# Patient Record
Sex: Male | Born: 1985 | Race: White | Hispanic: No | Marital: Married | Smoking: Former smoker
Health system: Southern US, Community
[De-identification: ages and names within clinical notes are randomized; demographics above are authoritative.]

## PROBLEM LIST (undated history)

## (undated) DIAGNOSIS — F32A Depression, unspecified: Secondary | ICD-10-CM

## (undated) DIAGNOSIS — F419 Anxiety disorder, unspecified: Secondary | ICD-10-CM

## (undated) DIAGNOSIS — G4733 Obstructive sleep apnea (adult) (pediatric): Secondary | ICD-10-CM

## (undated) DIAGNOSIS — I10 Essential (primary) hypertension: Secondary | ICD-10-CM

## (undated) DIAGNOSIS — K449 Diaphragmatic hernia without obstruction or gangrene: Secondary | ICD-10-CM

## (undated) DIAGNOSIS — F429 Obsessive-compulsive disorder, unspecified: Secondary | ICD-10-CM

## (undated) DIAGNOSIS — R454 Irritability and anger: Secondary | ICD-10-CM

## (undated) DIAGNOSIS — F329 Major depressive disorder, single episode, unspecified: Secondary | ICD-10-CM

## (undated) DIAGNOSIS — R4586 Emotional lability: Secondary | ICD-10-CM

## (undated) DIAGNOSIS — I517 Cardiomegaly: Secondary | ICD-10-CM

## (undated) HISTORY — DX: Essential (primary) hypertension: I10

## (undated) HISTORY — DX: Diaphragmatic hernia without obstruction or gangrene: K44.9

---

## 2008-08-28 ENCOUNTER — Encounter: Admission: RE | Admit: 2008-08-28 | Discharge: 2008-08-28 | Payer: Self-pay | Admitting: Family Medicine

## 2010-11-08 ENCOUNTER — Encounter: Payer: Self-pay | Admitting: Family Medicine

## 2014-01-27 ENCOUNTER — Emergency Department (HOSPITAL_COMMUNITY)
Admission: EM | Admit: 2014-01-27 | Discharge: 2014-01-28 | Disposition: A | Discharge status: Home / Self Care | Payer: PRIVATE HEALTH INSURANCE | Attending: Emergency Medicine | Admitting: Emergency Medicine

## 2014-01-27 ENCOUNTER — Emergency Department (HOSPITAL_COMMUNITY): Payer: PRIVATE HEALTH INSURANCE

## 2014-01-27 ENCOUNTER — Encounter (HOSPITAL_COMMUNITY): Payer: Self-pay | Admitting: Emergency Medicine

## 2014-01-27 DIAGNOSIS — Z79899 Other long term (current) drug therapy: Secondary | ICD-10-CM | POA: Insufficient documentation

## 2014-01-27 DIAGNOSIS — R45851 Suicidal ideations: Secondary | ICD-10-CM | POA: Insufficient documentation

## 2014-01-27 DIAGNOSIS — R0789 Other chest pain: Secondary | ICD-10-CM | POA: Insufficient documentation

## 2014-01-27 DIAGNOSIS — F329 Major depressive disorder, single episode, unspecified: Secondary | ICD-10-CM | POA: Insufficient documentation

## 2014-01-27 DIAGNOSIS — F411 Generalized anxiety disorder: Secondary | ICD-10-CM | POA: Insufficient documentation

## 2014-01-27 DIAGNOSIS — F3289 Other specified depressive episodes: Secondary | ICD-10-CM | POA: Insufficient documentation

## 2014-01-27 DIAGNOSIS — F1010 Alcohol abuse, uncomplicated: Secondary | ICD-10-CM | POA: Insufficient documentation

## 2014-01-27 DIAGNOSIS — F1210 Cannabis abuse, uncomplicated: Secondary | ICD-10-CM | POA: Insufficient documentation

## 2014-01-27 DIAGNOSIS — R630 Anorexia: Secondary | ICD-10-CM | POA: Insufficient documentation

## 2014-01-27 DIAGNOSIS — F430 Acute stress reaction: Secondary | ICD-10-CM | POA: Insufficient documentation

## 2014-01-27 DIAGNOSIS — F17200 Nicotine dependence, unspecified, uncomplicated: Secondary | ICD-10-CM | POA: Insufficient documentation

## 2014-01-27 HISTORY — DX: Major depressive disorder, single episode, unspecified: F32.9

## 2014-01-27 HISTORY — DX: Emotional lability: R45.86

## 2014-01-27 HISTORY — DX: Anxiety disorder, unspecified: F41.9

## 2014-01-27 HISTORY — DX: Obsessive-compulsive disorder, unspecified: F42.9

## 2014-01-27 HISTORY — DX: Irritability and anger: R45.4

## 2014-01-27 HISTORY — DX: Depression, unspecified: F32.A

## 2014-01-27 LAB — RAPID URINE DRUG SCREEN, HOSP PERFORMED
Amphetamines: NOT DETECTED
Barbiturates: NOT DETECTED
Benzodiazepines: NOT DETECTED
COCAINE: NOT DETECTED
Opiates: NOT DETECTED
Tetrahydrocannabinol: NOT DETECTED

## 2014-01-27 LAB — CBC WITH DIFFERENTIAL/PLATELET
BASOS ABS: 0.1 10*3/uL (ref 0.0–0.1)
Basophils Relative: 0 % (ref 0–1)
Eosinophils Absolute: 0.3 10*3/uL (ref 0.0–0.7)
Eosinophils Relative: 2 % (ref 0–5)
HEMATOCRIT: 46.4 % (ref 39.0–52.0)
HEMOGLOBIN: 16.8 g/dL (ref 13.0–17.0)
LYMPHS PCT: 18 % (ref 12–46)
Lymphs Abs: 2.6 10*3/uL (ref 0.7–4.0)
MCH: 30.5 pg (ref 26.0–34.0)
MCHC: 36.2 g/dL — ABNORMAL HIGH (ref 30.0–36.0)
MCV: 84.4 fL (ref 78.0–100.0)
MONOS PCT: 8 % (ref 3–12)
Monocytes Absolute: 1.1 10*3/uL — ABNORMAL HIGH (ref 0.1–1.0)
NEUTROS ABS: 10.5 10*3/uL — AB (ref 1.7–7.7)
Neutrophils Relative %: 72 % (ref 43–77)
Platelets: 250 10*3/uL (ref 150–400)
RBC: 5.5 MIL/uL (ref 4.22–5.81)
RDW: 12.5 % (ref 11.5–15.5)
WBC: 14.5 10*3/uL — AB (ref 4.0–10.5)

## 2014-01-27 LAB — ETHANOL

## 2014-01-27 LAB — COMPREHENSIVE METABOLIC PANEL
ALT: 15 U/L (ref 0–53)
AST: 20 U/L (ref 0–37)
Albumin: 4.2 g/dL (ref 3.5–5.2)
Alkaline Phosphatase: 88 U/L (ref 39–117)
BUN: 11 mg/dL (ref 6–23)
CO2: 24 mEq/L (ref 19–32)
CREATININE: 1.02 mg/dL (ref 0.50–1.35)
Calcium: 9.7 mg/dL (ref 8.4–10.5)
Chloride: 100 mEq/L (ref 96–112)
GFR calc Af Amer: >90 mL/min (ref >90)
GFR calc non Af Amer: >90 mL/min (ref >90)
Glucose, Bld: 125 mg/dL — ABNORMAL HIGH (ref 70–99)
Potassium: 3.8 mEq/L (ref 3.7–5.3)
Sodium: 142 mEq/L (ref 137–147)
Total Bilirubin: 0.2 mg/dL — ABNORMAL LOW (ref 0.3–1.2)
Total Protein: 7.4 g/dL (ref 6.0–8.3)

## 2014-01-27 LAB — D-DIMER, QUANTITATIVE (NOT AT ARMC): D-Dimer, Quant: <0.27 ug/mL-FEU (ref 0.00–0.48)

## 2014-01-27 LAB — TROPONIN I: Troponin I: <0.3 ng/mL (ref <0.30)

## 2014-01-27 MED ORDER — NICOTINE 21 MG/24HR TD PT24
21.0000 mg | MEDICATED_PATCH | Freq: Once | TRANSDERMAL | Status: DC
  Administered 2014-01-27: 21 mg via TRANSDERMAL
  Filled 2014-01-27: qty 1

## 2014-01-27 MED ORDER — ZOLPIDEM TARTRATE 5 MG PO TABS
5.0000 mg | ORAL_TABLET | Freq: Every evening | ORAL | Status: DC | PRN
  Administered 2014-01-27: 5 mg via ORAL
  Filled 2014-01-27: qty 1

## 2014-01-27 MED ORDER — CITALOPRAM HYDROBROMIDE 10 MG PO TABS
20.0000 mg | ORAL_TABLET | Freq: Every day | ORAL | Status: DC
  Administered 2014-01-27 – 2014-01-28 (×2): 20 mg via ORAL
  Filled 2014-01-27 (×2): qty 2

## 2014-01-27 MED ORDER — CLONIDINE HCL 0.2 MG PO TABS
0.2000 mg | ORAL_TABLET | Freq: Every day | ORAL | Status: DC
  Administered 2014-01-27 – 2014-01-28 (×2): 0.2 mg via ORAL
  Filled 2014-01-27 (×2): qty 1

## 2014-01-27 NOTE — BH Assessment (Signed)
Tele Assessment Note   Clinton Taylor is an 28 y.o. male, married, Caucasian who presents to [facility] ED accompanied by his wife, who participated in the assessment with the Pt's permission. Pt reports he is experiencing severe stress and is suicidal. Pt describes having having ongoing issues since childhood with depression, obsessive thoughts, sexual preoccupation, shame, substance abuse and lying. 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 Pt reports crying spells, poor sleep, increased alcohol use, irritability, obsessive sexual thoughts and behavior, bad dreams and feelings of sadness, guilt, shame and hopelessness. Pt report suicidal ideation with plan to "shoot myself under my chin" or use an intravenous line in an artery to bleed to death. Pt reports a history of one previous suicide attempt by cutting his wrist when he was an adolescent in response to his grandfather's death. Pt also reports a friend unexpectedly committed suicide by hanging himself two months ago. Pt denies homicidal ideation and says although he has problems with anger he has only been in two physical fights as an adolescent. He denies any psychotic symptoms.   Pt states as an adolescent he used crystal meth, cocaine and crack but he has not use any substances in years. He report binge drinking on liquor and these binges have increased in frequency. Pt reports he is currently drinking approximately 5-7 shots of liquor approximately five days per week. He reports a history of blackouts. He denies any history of alcohol withdrawal or seizures.  Pt identifies his primary problem as sexual issues that have created severe problems for his marriage. 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 He states he is attracted to women but has  also has had two sexual encounters with men that he didn't find satisfying. His wife says he is sexually aroused all the time, to the point where she wonders if there is something neurologically wrong with him. Pt acknowledges there have been several "infidelities."  Pt and wife also report Pt has a problem with pathological lying and that Pt will lie "about everything, even stupid little things like whether he spent $10 or $20." Pt's wife also states Pt is "angry all the time" and Pt reports he has mood swings. Pt states he has deep feelings of shame and believes there is something wrong with him. Pt states he has obsessive negative thoughts and uses sex and alcohol to distract himself.  Pt and his wife have been discussing Pt's problems recently and wife is ready for divorce.  Pt reports brief outpatient therapy as an adolescent. He was prescribed Zoloft by his PCP but did not like the way it made him feel and he stopped taking it two months ago. He has had no other outpatient mental health or substance abuse treatment. Pt has had no inpatient mental health or substance abuse treatment.  Pt denies any history of abuse but says he engaged in some sexual play with another child at age 34. He reports his mother has been diagnosed with obsessive-compulsive disorder. He reports there is a history of substance abuse on both sides of his family. He identifies his wife as his primary support and together they have three children, ages 76, 5 and 3. Pt is employed as a Teaching laboratory technician.  Pt is well-groomed, alert,  oriented x4 with normal speech and normal motor behavior. Eye contact is good with tearfulness. Pt's thought process is coherent and goal directed. There is no evidence Pt is responding to internal stimuli or experiencing delusional thought content. Pt's mood is depressed and ashamed and affect is congruent with mood. Pt was cooperative and candid regarding his various concerns. Pt is agreeable to  inpatient psychiatric treatment.  Axis I: 296.33 Major Depressive Disorder, Recurrent, Severe;  303.90 Alcohol Use Disorder, Moderate; Rule Out Bipolar Disorder; Rule Out Obsessive-Compulsive Disorder Axis II: Deferred Axis III:  Past Medical History  Diagnosis Date  . Depression   . Obsessive compulsive disorder   . Anxiety   . Mood changes   . Anger    Axis IV: other psychosocial or environmental problems and problems with primary support group Axis V: GAF=28  Past Medical History:  Past Medical History  Diagnosis Date  . Depression   . Obsessive compulsive disorder   . Anxiety   . Mood changes   . Anger     History reviewed. No pertinent past surgical history.  Family History: No family history on file.  Social History:  reports that he has been smoking Cigarettes.  He has been smoking about 0.50 packs per day. His smokeless tobacco use includes Chew. He reports that he drinks alcohol. He reports that he uses illicit drugs (Marijuana).  Additional Social History:  Alcohol / Drug Use Pain Medications: Denies abuse Prescriptions: Denies abuse Over the Counter: Pt reports drinking an entire bottle of Tylenol tying to sleep History of alcohol / drug use?: Yes (Pt reports trying crystal meth, cocaine and crack as an adolescent) Longest period of sobriety (when/how long): unknown Negative Consequences of Use: Personal relationships Substance #1 Name of Substance 1: Alcohol  1 - Age of First Use: teen 1 - Amount (size/oz): 5-7 shots of liquor 1 - Frequency: Average 5 days per week 1 - Duration: Ongoing 1 - Last Use / Amount: 01/27/14  CIWA: CIWA-Ar BP: 168/101 mmHg Pulse Rate: 99 COWS:    Allergies: No Known Allergies  Home Medications:  (Not in a hospital admission)  OB/GYN Status:  No LMP for male patient.  General Assessment Data Location of Assessment: [facility] ED Is this a Tele or Face-to-Face Assessment?: Tele Assessment Is this an Initial Assessment or a  Re-assessment for this encounter?: Initial Assessment Living Arrangements: Spouse/significant other;Children (Children, ages 44, 5, 3) Can pt return to current living arrangement?: Yes Admission Status: Voluntary Is patient capable of signing voluntary admission?: Yes Transfer from: Acute Hospital Referral Source: Self/Family/Friend     [facility] Crisis Care Plan Living Arrangements: Spouse/significant other;Children (Children, ages 21, 22, 3) Name of Psychiatrist: None Name of Therapist: None  Education Status Is patient currently in school?: No Current Grade: NA Highest grade of school patient has completed: NA Name of school: NA Contact person: NA  Risk to self Suicidal Ideation: Yes-Currently Present Suicidal Intent: Yes-Currently Present Is patient at risk for suicide?: Yes Suicidal Plan?: Yes-Currently Present Specify Current Suicidal Plan: Shoot himself, use IV line to bleed out Access to Means: Yes Specify Access to Suicidal Means: Gun in the home What has been your use of drugs/alcohol within the last 12 months?: Pt drinking heavily Previous Attempts/Gestures: Yes How many times?: 1 Other Self Harm Risks: None Triggers for Past Attempts: Other (Comment) (Death of grandfather) Intentional Self Injurious Behavior: None Family Suicide History: No;See progress notes Recent stressful life event(s): Conflict (Comment);Divorce;Loss (Comment) (Associate committed suicide, severe marital  problems) Persecutory voices/beliefs?: No Depression: Yes Depression Symptoms: Despondent;Insomnia;Tearfulness;Isolating;Guilt;Loss of interest in usual pleasures;Feeling worthless/self pity;Feeling angry/irritable;Fatigue Substance abuse history and/or treatment for substance abuse?: Yes Suicide prevention information given to non-admitted patients: Not applicable  Risk to Others Homicidal Ideation: No Thoughts of Harm to Others: No Current Homicidal Intent: No Current Homicidal Plan:  No Access to Homicidal Means: No Identified Victim: None History of harm to others?: No Assessment of Violence: None Noted Violent Behavior Description: None Does patient have access to weapons?: Yes (Comment) (Gun in the home) Criminal Charges Pending?: No Does patient have a court date: No  Psychosis Hallucinations: None noted Delusions: None noted  Mental Status Report Appear/Hygiene: Other (Comment) (Well groomed) Eye Contact: Good Motor Activity: Unremarkable Speech: Logical/coherent Level of Consciousness: Alert;Crying Mood: Depressed;Ashamed/humiliated;Guilty Affect: Depressed Anxiety Level: Moderate Thought Processes: Coherent;Relevant Judgement: Unimpaired Orientation: Person;Place;Time;Situation Obsessive Compulsive Thoughts/Behaviors: Moderate  Cognitive Functioning Concentration: Normal Memory: Recent Intact;Remote Intact IQ: Average Insight: Fair Impulse Control: Fair Appetite: Fair Weight Loss: 0 Weight Gain: 0 Sleep: Decreased Total Hours of Sleep: 5 Vegetative Symptoms: None  ADLScreening [facility] Assessment Services) Patient's cognitive ability adequate to safely complete daily activities?: Yes Patient able to express need for assistance with ADLs?: Yes Independently performs ADLs?: Yes (appropriate for developmental age)  Prior Inpatient Therapy Prior Inpatient Therapy: No Prior Therapy Dates: NA Prior Therapy Facilty/Provider(s): NA Reason for Treatment: NA  Prior Outpatient Therapy Prior Outpatient Therapy: Yes Prior Therapy Dates: As adolsecent Prior Therapy Facilty/Provider(s): Unknown Reason for Treatment: Depression  ADL Screening (condition at time of admission) Patient's cognitive ability adequate to safely complete daily activities?: Yes Is the patient deaf or have difficulty hearing?: No Does the patient have difficulty seeing, even when wearing glasses/contacts?: No Does the patient have difficulty concentrating, remembering, or  making decisions?: No Patient able to express need for assistance with ADLs?: Yes Does the patient have difficulty dressing or bathing?: No Independently performs ADLs?: Yes (appropriate for developmental age) Does the patient have difficulty walking or climbing stairs?: No Weakness of Legs: None Weakness of Arms/Hands: None  Home Assistive Devices/Equipment Home Assistive Devices/Equipment: None    Abuse/Neglect Assessment (Assessment to be complete while patient is alone) Physical Abuse: Denies Verbal Abuse: Denies Sexual Abuse: Denies Exploitation of patient/patient's resources: Denies Self-Neglect: Denies Values / Beliefs Cultural Requests During Hospitalization: None Spiritual Requests During Hospitalization: None   Advance Directives (For Healthcare) Advance Directive: Patient does not have advance directive;Patient would not like information Pre-existing out of facility DNR order (yellow form or pink MOST form): No Nutrition Screen- [facility] Adult/WL/AP Patient's home diet: Regular  Additional Information 1:1 In Past 12 Months?: No CIRT Risk: No Elopement Risk: No Does patient have medical clearance?: Yes     Disposition: Per Thurman CoyerEric Kaplan, [facility], adult unit is at capacity. Gave clinical report to Alberteen SamFran Hobson, NP who agrees Pt meets criteria for inpatient psychiatric treatment. TTS will contact other facilities for placement, otherwise Pt [facility].   Disposition Initial Assessment Completed for this Encounter: Yes Disposition of Patient: Inpatient treatment program;Other dispositions Type of inpatient treatment program: Adult Other disposition(s): Referred to outside facility;Other (Comment) ([facility] at capacity)  Clinton Taylor, [facility], [facility] Triage Specialist [phone]   Clinton RainFord Ellis Patsy BaltimoreWarrick Taylor. 01/27/2014 9:27 PM

## 2014-01-27 NOTE — BH Assessment (Signed)
Assessment complete. Per Thurman CoyerEric Kaplan, [facility], adult unit is at [facility]. Gave clinical report to Alberteen SamFran Hobson, NP who agrees Pt meets criteria for inpatient psychiatric treatment. TTS will contact other facilities for placement, otherwise [facility].  Harlin RainFord Ellis Ria CommentWarrick Jr, LPC, [facility] Triage Specialist [phone]

## 2014-01-27 NOTE — ED Notes (Addendum)
Pt states he feels suicidal, has a plan to shoot himself with gun. States he is experiencing severe anxiety with family issues. States he wants to get some help. Denies HI at the time. Pt also states chest tightness, pt is tearful during assessment.

## 2014-01-27 NOTE — BH Assessment (Signed)
Spoke to Pt's RN to arrange tele-assessment. Pt is in x-ray. RN will call TTS at [phone] when Pt is ready for tele-assessment.  Harlin RainFord Ellis Ria CommentWarrick Jr, LPC, [facility] Triage Specialist [phone]

## 2014-01-27 NOTE — ED Provider Notes (Signed)
CSN: [id number]     Arrival date & time 01/27/14  1650 History   First MD Initiated Contact with Patient 01/27/14 1713     Chief Complaint  Patient presents with  . Suicidal     (Consider location/radiation/quality/duration/timing/severity/associated sxs/prior Treatment) HPI Comments: Patient presents with suicidal thoughts for the past several days he had a plan to shoot himself yesterday. He is going through divorce he has a lot of stress with his wife and is facing sexuality issues. He admits to binge drinking 6-7 beers once in a while but not every day. Uses marijuana occasionally and smokes cigarettes. He denies any homicidal thoughts or hallucinations. He takes Celexa for depression. Usually takes clonidine for elevated blood pressure but has been out of it for the past one month. He said intermittent episodes of chest pain for the past month lasting a few minutes at a time the center of his chest that come and go. Denies any shortness of breath, nausea or vomiting.  The history is provided by the patient.    Past Medical History  Diagnosis Date  . Depression   . Obsessive compulsive disorder   . Anxiety   . Mood changes   . Anger    History reviewed. No pertinent past surgical history. No family history on file. History  Substance Use Topics  . Smoking status: Current Every Day Smoker -- 0.50 packs/day    Types: Cigarettes  . Smokeless tobacco: Current User    Types: Chew  . Alcohol Use: Yes     Comment: socially    Review of Systems  Constitutional: Positive for activity change and appetite change. Negative for fever.  HENT: Negative for congestion and rhinorrhea.   Respiratory: Positive for chest tightness. Negative for cough and shortness of breath.   Cardiovascular: Negative for chest pain.  Gastrointestinal: Negative for nausea, vomiting and abdominal pain.  Genitourinary: Negative for dysuria and hematuria.  Musculoskeletal: Negative for arthralgias and  myalgias.  Skin: Negative for rash.  Neurological: Negative for dizziness, weakness and headaches.  Psychiatric/Behavioral: Positive for suicidal ideas and self-injury. The patient is nervous/anxious.   A complete 10 system review of systems was obtained and all systems are negative except as noted in the HPI and PMH.      Allergies  Review of patient's allergies indicates no known allergies.  Home Medications   Current Outpatient Rx  Name  Route  Sig  Dispense  Refill  . citalopram (CELEXA) 20 MG tablet   Oral   Take 20 mg by mouth daily.         . cloNIDine (CATAPRES) 0.2 MG tablet   Oral   Take 0.2 mg by mouth daily.         Marland Kitchen ibuprofen (ADVIL,MOTRIN) 200 MG tablet   Oral   Take 200 mg by mouth every 6 (six) hours as needed.          BP 162/94  Pulse 74  Temp(Src) 97.8 F (36.6 C) (Oral)  Resp 16  Ht 6' (1.829 m)  Wt 263 lb (119.296 kg)  BMI 35.66 kg/m2  SpO2 96% Physical Exam  Constitutional: He is oriented to person, place, and time. He appears well-developed and well-nourished. No distress.  Flat affect.   HENT:  Head: Normocephalic and atraumatic.  Mouth/Throat: Oropharynx is clear and moist. No oropharyngeal exudate.  Eyes: Conjunctivae and EOM are normal. Pupils are equal, round, and reactive to light.  Dilated pupils  Neck: Normal range of motion. Neck supple.  Cardiovascular: Normal rate, regular rhythm and normal heart sounds.   No murmur heard. Pulmonary/Chest: Effort normal and breath sounds normal. No respiratory distress.  Abdominal: Soft. Bowel sounds are normal. There is no tenderness. There is no rebound and no guarding.  Musculoskeletal: Normal range of motion. He exhibits no edema and no tenderness.  Neurological: He is alert and oriented to person, place, and time. No cranial nerve deficit. He exhibits normal muscle tone. Coordination normal.  Skin: Skin is warm.    ED Course  Procedures (including critical care time) Labs  Review Labs Reviewed  CBC WITH DIFFERENTIAL - Abnormal; Notable for the following:    WBC 14.5 (*)    MCHC 36.2 (*)    Neutro Abs 10.5 (*)    Monocytes Absolute 1.1 (*)    All other components within normal limits  COMPREHENSIVE METABOLIC PANEL - Abnormal; Notable for the following:    Glucose, Bld 125 (*)    Total Bilirubin 0.2 (*)    All other components within normal limits  URINE RAPID DRUG SCREEN (HOSP PERFORMED)  ETHANOL  TROPONIN I  D-DIMER, QUANTITATIVE  TROPONIN I   Imaging Review Dg Chest 2 View  01/27/2014   CLINICAL DATA:  Substernal chest pain for 1 day  EXAM: CHEST  2 VIEW  COMPARISON:  None.  FINDINGS: The heart size and mediastinal contours are within normal limits. Both lungs are clear. The visualized skeletal structures are unremarkable.  IMPRESSION: No active cardiopulmonary disease.   Electronically Signed   By: Christiana PellantGretchen  Green M.D.   On: 01/27/2014 19:58     EKG Interpretation   Date/Time:  Sunday January 27 2014 17:43:30 EDT Ventricular Rate:  97 PR Interval:  163 QRS Duration: 87 QT Interval:  325 QTC Calculation: 413 R Axis:   -48 Text Interpretation:  Sinus rhythm Left atrial enlargement Left  ventricular hypertrophy No previous ECGs available Confirmed by Manus GunningANCOUR   MD, Garlin Batdorf [phone]([id number]) on 01/27/2014 5:53:02 PM      MDM   Final diagnoses:  Suicidal ideation   Suicidal thoughts with plan to shoot himself. Intermittent chest pain over the past month with noncompliance with hypertensive medications. Patient hypertensive. He has not taken his blood pressure medication for the past month or longer  Screening labs unremarkable. Troponin negative. No chest pain or shortness of breath currently. EKG with LVH. No acute ST changes.  Labs remarkable for leukocytosis. Troponin negative. D-dimer negative Chest pain is atypical for ACS and likely secondary to anxiety. Patient is restarted on his home blood pressure medications. Holding orders placed.  Stable for psychiatric evaluation  Glynn OctaveStephen Lenoir Facchini, MD 01/27/14 2348

## 2014-01-27 NOTE — ED Notes (Signed)
Telepsych in process 

## 2014-01-27 NOTE — BH Assessment (Signed)
Received call for assessment. Spoke to Langley GaussBryan Sem, MD who said Pt is suicidal with plan to shoot himself. He is going through divorce. Tele-assessment will be initiated.  Harlin RainFord Ellis Ria CommentWarrick Jr, LPC, [facility] Triage Specialist [phone]

## 2014-01-27 NOTE — ED Notes (Signed)
Pt states that he is here due to "a lot going on in my life", "psychological problems", "facing divorce" and "suicidal thoughts".  Pt reports SI, states that he was going to shoot himself yesterday.  Pt has guns at home.  Pt is crying in triage, wife is with him, tearful also.  Pt denies any HI, pt is calm and cooperative.

## 2014-01-28 ENCOUNTER — Inpatient Hospital Stay (HOSPITAL_COMMUNITY)
Admission: AD | Admit: 2014-01-28 | Discharge: 2014-02-01 | DRG: 897 | Disposition: A | Discharge status: Home / Self Care | Payer: PRIVATE HEALTH INSURANCE | Source: Intra-hospital | Attending: Psychiatry | Admitting: Psychiatry

## 2014-01-28 ENCOUNTER — Encounter (HOSPITAL_COMMUNITY): Payer: Self-pay

## 2014-01-28 DIAGNOSIS — F411 Generalized anxiety disorder: Secondary | ICD-10-CM | POA: Diagnosis present

## 2014-01-28 DIAGNOSIS — F10939 Alcohol use, unspecified with withdrawal, unspecified: Principal | ICD-10-CM | POA: Diagnosis present

## 2014-01-28 DIAGNOSIS — F410 Panic disorder [episodic paroxysmal anxiety] without agoraphobia: Secondary | ICD-10-CM | POA: Diagnosis present

## 2014-01-28 DIAGNOSIS — F319 Bipolar disorder, unspecified: Secondary | ICD-10-CM | POA: Diagnosis present

## 2014-01-28 DIAGNOSIS — G4700 Insomnia, unspecified: Secondary | ICD-10-CM | POA: Diagnosis present

## 2014-01-28 DIAGNOSIS — Z23 Encounter for immunization: Secondary | ICD-10-CM

## 2014-01-28 DIAGNOSIS — F339 Major depressive disorder, recurrent, unspecified: Secondary | ICD-10-CM | POA: Diagnosis present

## 2014-01-28 DIAGNOSIS — F17200 Nicotine dependence, unspecified, uncomplicated: Secondary | ICD-10-CM | POA: Diagnosis present

## 2014-01-28 DIAGNOSIS — F1020 Alcohol dependence, uncomplicated: Secondary | ICD-10-CM | POA: Diagnosis present

## 2014-01-28 DIAGNOSIS — I10 Essential (primary) hypertension: Secondary | ICD-10-CM | POA: Diagnosis present

## 2014-01-28 DIAGNOSIS — F429 Obsessive-compulsive disorder, unspecified: Secondary | ICD-10-CM | POA: Diagnosis present

## 2014-01-28 DIAGNOSIS — F1994 Other psychoactive substance use, unspecified with psychoactive substance-induced mood disorder: Secondary | ICD-10-CM | POA: Diagnosis present

## 2014-01-28 DIAGNOSIS — F10239 Alcohol dependence with withdrawal, unspecified: Principal | ICD-10-CM | POA: Diagnosis present

## 2014-01-28 DIAGNOSIS — F332 Major depressive disorder, recurrent severe without psychotic features: Secondary | ICD-10-CM | POA: Diagnosis present

## 2014-01-28 DIAGNOSIS — F3160 Bipolar disorder, current episode mixed, unspecified: Secondary | ICD-10-CM | POA: Diagnosis present

## 2014-01-28 DIAGNOSIS — R45851 Suicidal ideations: Secondary | ICD-10-CM

## 2014-01-28 MED ORDER — NICOTINE 21 MG/24HR TD PT24
MEDICATED_PATCH | TRANSDERMAL | Status: AC
Start: 2014-01-28 — End: 2014-01-29
  Administered 2014-01-28: 21 mg via TRANSDERMAL
  Filled 2014-01-28: qty 1

## 2014-01-28 MED ORDER — LORAZEPAM 1 MG PO TABS
1.0000 mg | ORAL_TABLET | Freq: Four times a day (QID) | ORAL | Status: DC | PRN
  Administered 2014-01-28: 1 mg via ORAL
  Filled 2014-01-28: qty 1

## 2014-01-28 MED ORDER — ALUM & MAG HYDROXIDE-SIMETH 200-200-20 MG/5ML PO SUSP
30.0000 mL | ORAL | Status: DC | PRN
  Administered 2014-02-01: 30 mL via ORAL

## 2014-01-28 MED ORDER — MAGNESIUM HYDROXIDE 400 MG/5ML PO SUSP: 30.0000 mL | Freq: Every day | ORAL | Status: DC | PRN

## 2014-01-28 MED ORDER — CLONIDINE HCL 0.2 MG PO TABS
0.2000 mg | ORAL_TABLET | Freq: Every day | ORAL | Status: DC
  Administered 2014-01-28 – 2014-01-30 (×3): 0.2 mg via ORAL
  Filled 2014-01-28 (×2): qty 1
  Filled 2014-01-28: qty 2
  Filled 2014-01-28: qty 1
  Filled 2014-01-28 (×2): qty 2
  Filled 2014-01-28: qty 1
  Filled 2014-01-28: qty 2
  Filled 2014-01-28: qty 1

## 2014-01-28 MED ORDER — AMLODIPINE BESYLATE 10 MG PO TABS
10.0000 mg | ORAL_TABLET | Freq: Once | ORAL | Status: AC
  Administered 2014-01-28: 10 mg via ORAL
  Filled 2014-01-28: qty 1

## 2014-01-28 MED ORDER — CITALOPRAM HYDROBROMIDE 20 MG PO TABS
20.0000 mg | ORAL_TABLET | Freq: Every day | ORAL | Status: DC
  Administered 2014-01-28 – 2014-02-01 (×5): 20 mg via ORAL
  Filled 2014-01-28: qty 3
  Filled 2014-01-28 (×10): qty 1

## 2014-01-28 MED ORDER — CHLORDIAZEPOXIDE HCL 25 MG PO CAPS
25.0000 mg | ORAL_CAPSULE | Freq: Three times a day (TID) | ORAL | Status: DC | PRN
  Administered 2014-01-28 – 2014-01-31 (×4): 25 mg via ORAL
  Filled 2014-01-28 (×5): qty 1

## 2014-01-28 MED ORDER — ACETAMINOPHEN 325 MG PO TABS
650.0000 mg | ORAL_TABLET | Freq: Four times a day (QID) | ORAL | Status: DC | PRN
  Administered 2014-01-28: 650 mg via ORAL
  Filled 2014-01-28: qty 2

## 2014-01-28 MED ORDER — IBUPROFEN 600 MG PO TABS: 600.0000 mg | ORAL_TABLET | Freq: Four times a day (QID) | ORAL | Status: DC | PRN

## 2014-01-28 MED ORDER — ACETAMINOPHEN 325 MG PO TABS
650.0000 mg | ORAL_TABLET | Freq: Four times a day (QID) | ORAL | Status: DC | PRN
  Administered 2014-01-28 – 2014-01-30 (×2): 650 mg via ORAL
  Filled 2014-01-28 (×2): qty 2

## 2014-01-28 MED ORDER — HYDROXYZINE HCL 25 MG PO TABS
25.0000 mg | ORAL_TABLET | Freq: Four times a day (QID) | ORAL | Status: DC | PRN
  Administered 2014-01-29 – 2014-02-01 (×5): 25 mg via ORAL
  Filled 2014-01-28 (×5): qty 1

## 2014-01-28 MED ORDER — CLONIDINE HCL 0.1 MG PO TABS
0.1000 mg | ORAL_TABLET | Freq: Once | ORAL | Status: AC
Start: 2014-01-28 — End: 2014-01-28
  Administered 2014-01-28: 0.1 mg via ORAL
  Filled 2014-01-28: qty 1

## 2014-01-28 MED ORDER — LAMOTRIGINE 25 MG PO TABS
25.0000 mg | ORAL_TABLET | Freq: Two times a day (BID) | ORAL | Status: DC
  Administered 2014-01-28 – 2014-01-29 (×2): 25 mg via ORAL
  Filled 2014-01-28 (×6): qty 1

## 2014-01-28 MED ORDER — NICOTINE 21 MG/24HR TD PT24
21.0000 mg | MEDICATED_PATCH | Freq: Every day | TRANSDERMAL | Status: DC
  Administered 2014-01-28 – 2014-02-01 (×5): 21 mg via TRANSDERMAL
  Filled 2014-01-28 (×7): qty 1

## 2014-01-28 MED ORDER — TRAZODONE HCL 50 MG PO TABS
50.0000 mg | ORAL_TABLET | Freq: Every evening | ORAL | Status: DC | PRN
  Administered 2014-01-28 – 2014-01-30 (×5): 50 mg via ORAL
  Filled 2014-01-28 (×10): qty 1

## 2014-01-28 NOTE — Progress Notes (Signed)
Pt depressed and expresses feelings of guilt and worry about his life, wife and behaviors. Denies SI/HI at present. Verbally contracts for safety. Multiple calls to wife. Will continue to monitor closely and maintain safe environment.

## 2014-01-28 NOTE — Progress Notes (Signed)
MHT initiated psychiatric inpatient placement at the following facilities with bed availability:  1)[facility]-faxed referral 2)[facility]-faxed referral 3)[facility]-faxed referral 4)[facility]-at capacity 5)[facility]-faxed referral 6)[facility]-faxed referral 7)[facility]-faxed referral 8)[facility]-faxed referral 9)[facility]-faxed referral 10)[facility]-no outside referrals 01/28/14  Blain PaisMichelle L Leonard Hendler, MHT/NS

## 2014-01-28 NOTE — ED Provider Notes (Addendum)
Pt ambulatory about pod c, talking on phone, calm and alert appearing. Discussed w psych team, placement pending.     Suzi RootsKevin E Carolos Fecher, MD 01/28/14 0912  Pt notes long hx htn, previously on several meds including benicar, bystolic, clonidine.  Pt states in recent months, only on clonidine once daily, but bp has stayed high.  Pt is hypertensive, but not severely high on current meds, and denies acute symptoms, no cp, sob or headaches.   As previously on multiple bp medication, will add amlodipine for improved bp control.  Psych team [facility]  Pt appears stable for transfer there.     Suzi RootsKevin E Keeya Dyckman, MD 01/28/14 [phone]

## 2014-01-28 NOTE — Progress Notes (Signed)
Per, NP Renata Capriceonrad the patient will be sent to the OBS unit after BP is stabilized below 160/100.   Renata Capriceonrad, NP informed the resident for Dr. Denton LankSteinl.

## 2014-01-28 NOTE — Progress Notes (Signed)
Patient complaining of tremors and anxiety. CIWA is 4. Librium 25 mg given at 1745 with relief. Patient resting quietly supine.

## 2014-01-28 NOTE — Progress Notes (Signed)
[facility] OBS ADMISSION NOTE / ADMISSION SRA  01/28/2014 2:03 PM Clinton Taylor  MRN:  [id number] Subjective:  Pt presents to [facility] OBS unit from [place] secondary to severe anxiety and suicidal ideation. During initial assessment, pt reports that his major stressors are alcohol and impulsivity including obsessions he feels he must act on. In dialogue with pt, he describes multiple sexual behaviors and self-criticism related to his "experimenting with homosexuality". Pt states that he would like inpatient hospitalization and wants to talk about these concerns in group therapy. Pt was informed that group therapy usually has a theme or agenda for each group and that he would benefit a lot more from one-on-one outpatient intensive therapy. 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   HPI: Clinton Taylor is an 28 y.o. male, married, Caucasian who presents to [facility] ED accompanied by his wife, who participated in the assessment with the Pt's permission. Pt reports he is experiencing severe stress and is suicidal. Pt describes having having ongoing issues since childhood with depression, obsessive thoughts, sexual preoccupation, shame, substance abuse and lying. 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 Pt reports crying spells, poor sleep, increased alcohol use, irritability, obsessive sexual thoughts and behavior, bad dreams and feelings of sadness, guilt, shame and hopelessness. Pt report suicidal ideation with plan to "shoot myself under my chin" or use an intravenous line in an artery to bleed to death. Pt reports a history of one previous suicide attempt by cutting his wrist when he was an adolescent in response to his grandfather's death. Pt also reports a friend unexpectedly committed suicide by hanging  himself two months ago. Pt denies homicidal ideation and says although he has problems with anger he has only been in two physical fights as an adolescent. He denies any psychotic symptoms.   Pt states as an adolescent he used crystal meth, cocaine and crack but he has not use any substances in years. He report binge drinking on liquor and these binges have increased in frequency. Pt reports he is currently drinking approximately 5-7 shots of liquor approximately five days per week. He reports a history of blackouts. He denies any history of alcohol withdrawal or seizures.  Pt identifies his primary problem as sexual issues that have created severe problems for his marriage. 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 He states he is attracted to women but has also has had two sexual encounters with men that he didn't find satisfying. His wife says he is sexually aroused all the time, to the point where she wonders if there is something neurologically wrong with him. Pt acknowledges there have been several "infidelities." Pt and wife also report Pt has a problem with pathological lying and that Pt will lie "about everything, even stupid little things like whether he spent $10 or $20." Pt's wife also states Pt is "angry all the time" and Pt reports he has mood swings. Pt states he has deep feelings of shame and believes there is something wrong with him. Pt states he has obsessive negative thoughts and uses sex and alcohol to distract himself. Pt and his wife have been discussing Pt's problems recently  and wife is ready for divorce. Pt reports brief outpatient therapy as an adolescent. He was prescribed Zoloft by his PCP but did not like the way it made him feel and he stopped taking it two months ago. He has had no other outpatient mental health or substance abuse treatment. Pt has had no inpatient mental health or substance  abuse treatment.    Diagnosis:   DSM5: Depressive Disorders:  Major Depressive Disorder - Severe (296.23) Total Time spent with patient: Greater than 30 minutes  Axis I: Bipolar, mixed and Major Depression, Recurrent severe Axis II: Deferred Axis III:  Past Medical History  Diagnosis Date  . Depression   . Obsessive compulsive disorder   . Anxiety   . Mood changes   . Anger    Axis IV: other psychosocial or environmental problems and problems related to social environment Axis V: 41-50 serious symptoms  ADL's:  Intact  Sleep: Fair  Appetite:  Good  Suicidal Ideation:  Denies Homicidal Ideation:  Denies AEB (as evidenced by):  Psychiatric Specialty Exam: Physical Exam Full Physical Exam performed in ED; reviewed, stable, and I concur with this assessment.   Review of Systems  Constitutional: Negative.   HENT: Negative.   Eyes: Negative.   Respiratory: Negative.   Cardiovascular: Negative.   Gastrointestinal: Negative.   Genitourinary: Negative.   Musculoskeletal: Negative.   Skin: Negative.   Neurological: Negative.   Endo/Heme/Allergies: Negative.   Psychiatric/Behavioral: Positive for depression and substance abuse. The patient is nervous/anxious and has insomnia.     There were no vitals taken for this visit.There is no weight on file to calculate BMI.  General Appearance: Casual  Eye Contact::  Good  Speech:  Clear and Coherent and Pressured  Volume:  Increased  Mood:  Anxious  Affect:  Appropriate  Thought Process:  Coherent  Orientation:  Full (Time, Place, and Person)  Thought Content:  Obsessions (sexual content)  Suicidal Thoughts:  No  Homicidal Thoughts:  No  Memory:  Immediate;   Fair Recent;   Fair Remote;   Fair  Judgement:  Fair  Insight:  Fair  Psychomotor Activity:  Increased  Concentration:  Fair  Recall:  Good  Fund of Knowledge:Good  Language: Good  Akathisia:  NA  Handed:  Right  AIMS (if indicated):     Assets:   Communication Skills Desire for Improvement Resilience Social Support  Sleep:      Musculoskeletal: Strength & Muscle Tone: within normal limits Gait & Station: normal Patient leans: N/A  Current Medications: Current Facility-Administered Medications  Medication Dose Route Frequency Provider Last Rate Last Dose  . acetaminophen (TYLENOL) tablet 650 mg  650 mg Oral Q6H PRN Benjamine Mola, FNP      . alum & mag hydroxide-simeth (MAALOX/MYLANTA) 200-200-20 MG/5ML suspension 30 mL  30 mL Oral Q4H PRN Benjamine Mola, FNP      . chlordiazePOXIDE (LIBRIUM) capsule 25 mg  25 mg Oral TID PRN Benjamine Mola, FNP      . citalopram (CELEXA) tablet 20 mg  20 mg Oral Daily Benjamine Mola, FNP      . cloNIDine (CATAPRES) tablet 0.2 mg  0.2 mg Oral Daily Benjamine Mola, FNP      . hydrOXYzine (ATARAX/VISTARIL) tablet 25 mg  25 mg Oral Q6H PRN Benjamine Mola, FNP      . ibuprofen (ADVIL,MOTRIN) tablet 600 mg  600 mg Oral Q6H PRN Benjamine Mola, FNP      .  magnesium hydroxide (MILK OF MAGNESIA) suspension 30 mL  30 mL Oral Daily PRN Benjamine Mola, FNP        Lab Results:  Results for orders placed during the hospital encounter of 01/27/14 (from the past 48 hour(s))  CBC WITH DIFFERENTIAL     Status: Abnormal   Collection Time    01/27/14  5:26 PM      Result Value Ref Range   WBC 14.5 (*) 4.0 - 10.5 K/uL   RBC 5.50  4.22 - 5.81 MIL/uL   Hemoglobin 16.8  13.0 - 17.0 g/dL   HCT 46.4  39.0 - 52.0 %   MCV 84.4  78.0 - 100.0 fL   MCH 30.5  26.0 - 34.0 pg   MCHC 36.2 (*) 30.0 - 36.0 g/dL   RDW 12.5  11.5 - 15.5 %   Platelets 250  150 - 400 K/uL   Neutrophils Relative % 72  43 - 77 %   Neutro Abs 10.5 (*) 1.7 - 7.7 K/uL   Lymphocytes Relative 18  12 - 46 %   Lymphs Abs 2.6  0.7 - 4.0 K/uL   Monocytes Relative 8  3 - 12 %   Monocytes Absolute 1.1 (*) 0.1 - 1.0 K/uL   Eosinophils Relative 2  0 - 5 %   Eosinophils Absolute 0.3  0.0 - 0.7 K/uL   Basophils Relative 0  0 - 1 %   Basophils Absolute  0.1  0.0 - 0.1 K/uL  COMPREHENSIVE METABOLIC PANEL     Status: Abnormal   Collection Time    01/27/14  5:26 PM      Result Value Ref Range   Sodium 142  137 - 147 mEq/L   Potassium 3.8  3.7 - 5.3 mEq/L   Chloride 100  96 - 112 mEq/L   CO2 24  19 - 32 mEq/L   Glucose, Bld 125 (*) 70 - 99 mg/dL   BUN 11  6 - 23 mg/dL   Creatinine, Ser 1.02  0.50 - 1.35 mg/dL   Calcium 9.7  8.4 - 10.5 mg/dL   Total Protein 7.4  6.0 - 8.3 g/dL   Albumin 4.2  3.5 - 5.2 g/dL   AST 20  0 - 37 U/L   ALT 15  0 - 53 U/L   Alkaline Phosphatase 88  39 - 117 U/L   Total Bilirubin 0.2 (*) 0.3 - 1.2 mg/dL   GFR calc non Af Amer >90  >90 mL/min   GFR calc Af Amer >90  >90 mL/min   Comment: (NOTE)     The eGFR has been calculated using the CKD EPI equation.     This calculation has not been validated in all clinical situations.     eGFR's persistently <90 mL/min signify possible Chronic Kidney     Disease.  ETHANOL     Status: None   Collection Time    01/27/14  5:26 PM      Result Value Ref Range   Alcohol, Ethyl (B) <11  0 - 11 mg/dL   Comment:            LOWEST DETECTABLE LIMIT FOR     SERUM ALCOHOL IS 11 mg/dL     FOR MEDICAL PURPOSES ONLY  TROPONIN I     Status: None   Collection Time    01/27/14  5:26 PM      Result Value Ref Range   Troponin I <0.30  <0.30 ng/mL  Comment:            Due to the release kinetics of cTnI,     a negative result within the first hours     of the onset of symptoms does not rule out     myocardial infarction with certainty.     If myocardial infarction is still suspected,     repeat the test at appropriate intervals.  D-DIMER, QUANTITATIVE     Status: None   Collection Time    01/27/14  7:25 PM      Result Value Ref Range   D-Dimer, Quant <0.27  0.00 - 0.48 ug/mL-FEU   Comment:            AT THE INHOUSE ESTABLISHED CUTOFF     VALUE OF 0.48 ug/mL FEU,     THIS ASSAY HAS BEEN DOCUMENTED     IN THE LITERATURE TO HAVE     A SENSITIVITY AND NEGATIVE      PREDICTIVE VALUE OF AT LEAST     98 TO 99%.  THE TEST RESULT     SHOULD BE CORRELATED WITH     AN ASSESSMENT OF THE CLINICAL     PROBABILITY OF DVT / VTE.  TROPONIN I     Status: None   Collection Time    01/27/14  7:25 PM      Result Value Ref Range   Troponin I <0.30  <0.30 ng/mL   Comment:            Due to the release kinetics of cTnI,     a negative result within the first hours     of the onset of symptoms does not rule out     myocardial infarction with certainty.     If myocardial infarction is still suspected,     repeat the test at appropriate intervals.  URINE RAPID DRUG SCREEN (HOSP PERFORMED)     Status: None   Collection Time    01/27/14 10:30 PM      Result Value Ref Range   Opiates NONE DETECTED  NONE DETECTED   Cocaine NONE DETECTED  NONE DETECTED   Benzodiazepines NONE DETECTED  NONE DETECTED   Amphetamines NONE DETECTED  NONE DETECTED   Tetrahydrocannabinol NONE DETECTED  NONE DETECTED   Barbiturates NONE DETECTED  NONE DETECTED   Comment:            DRUG SCREEN FOR MEDICAL PURPOSES     ONLY.  IF CONFIRMATION IS NEEDED     FOR ANY PURPOSE, NOTIFY LAB     WITHIN 5 DAYS.                LOWEST DETECTABLE LIMITS     FOR URINE DRUG SCREEN     Drug Class       Cutoff (ng/mL)     Amphetamine      1000     Barbiturate      200     Benzodiazepine   177     Tricyclics       116     Opiates          300     Cocaine          300     THC              50    Physical Findings: AIMS:  , ,  ,  ,    CIWA:    COWS:  Treatment Plan Summary: Daily contact with patient to assess and evaluate symptoms and progress in treatment Medication management  Plan: Review of chart, vital signs, medications, and notes.  1-Individual and group therapy  2-Medication management for depression and anxiety: Medications reviewed with the patient and she stated no untoward effects, unchanged. 3-Coping skills for depression, anxiety  4-Continue crisis stabilization and  management  5-Address health issues--monitoring vital signs, stable  6-Treatment plan in progress to prevent relapse of depression and anxiety  Medical Decision Making Problem Points:  Established problem, stable/improving (1), Review of last therapy session (1) and Review of psycho-social stressors (1) Data Points:  Review or order clinical lab tests (1) Review or order medicine tests (1) Review of medication regiment & side effects (2) Review of new medications or change in dosage (2)   Suicide Risk Admission Assessment     Nursing information obtained from:    Demographic factors:    Current Mental Status:    Loss Factors:    Historical Factors:    Risk Reduction Factors:    Total Time spent with patient: Greater than 30 minutes  CLINICAL FACTORS:   Unstable or Poor Therapeutic Relationship Previous Psychiatric Diagnoses and Treatments Medical Diagnoses and Treatments/Surgeries  Psychiatric Specialty Exam: SEE ABOVE PSE   Musculoskeletal: SEE ABOVE  COGNITIVE FEATURES THAT CONTRIBUTE TO RISK:  Polarized thinking    SUICIDE RISK:   Moderate:  Frequent suicidal ideation with limited intensity, and duration, some specificity in terms of plans, no associated intent, good self-control, limited dysphoria/symptomatology, some risk factors present, and identifiable protective factors, including available and accessible social support.  PLAN OF CARE: SEE ABOVE  Benjamine Mola, FNP-BC 01/28/2014, 2:03 PM

## 2014-01-28 NOTE — ED Notes (Signed)
Reported pt.s BP to Chrisandra Carotaonrad Withrope, NP .  Orders received for pt[facility]

## 2014-01-28 NOTE — Progress Notes (Addendum)
Patient shared multiple episodes of sexually risky behavior while drinking in excess. "I do have an alcohol problem." Patient tearful when sharing. Patient's friend recently completed suicide after they were on an out of town trip and both engaged in sexual behaviors at a strip club. His friend returned home and committed suicide. 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 Patient presents as hopeless, helpless and states that he has "a lot of shame."

## 2014-01-28 NOTE — Progress Notes (Signed)
28 year old male admitted for depression, anxiety and SI with a plan to shoot self. Patient openly shares that he has struggled with pathological lying,increased sexual appetite, and rectal fetish for "a while." he also states that he has OCD tendencies surrounding these issues. Patient voiced that he has  Been prescribed  Paxil and Zoloft in the past which "made me feel terrible." Patient has recently started on Celexa 20 mg QD. Patient lives with wife and three small children and is employed. Patient oriented to unit, provided with Nicotine patch and given nourishment.

## 2014-01-28 NOTE — Progress Notes (Signed)
CM consulted with patient to follow up with PCP. Patient stated that he has PCP and health insurance coverage and he reported that he does not have any difficulty obtaining his medications. Updated EPIC with PCP. Ferdinand CavaAndrea Schettino, RN, BSN, Case Managers 01/28/2014 3:08 PM

## 2014-01-29 DIAGNOSIS — F10239 Alcohol dependence with withdrawal, unspecified: Principal | ICD-10-CM | POA: Diagnosis present

## 2014-01-29 DIAGNOSIS — F3160 Bipolar disorder, current episode mixed, unspecified: Secondary | ICD-10-CM | POA: Diagnosis present

## 2014-01-29 DIAGNOSIS — F10939 Alcohol use, unspecified with withdrawal, unspecified: Principal | ICD-10-CM | POA: Diagnosis present

## 2014-01-29 DIAGNOSIS — F319 Bipolar disorder, unspecified: Secondary | ICD-10-CM | POA: Diagnosis present

## 2014-01-29 MED ORDER — LISINOPRIL 20 MG PO TABS
20.0000 mg | ORAL_TABLET | Freq: Once | ORAL | Status: AC
  Administered 2014-01-29: 20 mg via ORAL
  Filled 2014-01-29 (×2): qty 1

## 2014-01-29 MED ORDER — PNEUMOCOCCAL VAC POLYVALENT 25 MCG/0.5ML IJ INJ
0.5000 mL | INJECTION | INTRAMUSCULAR | Status: AC
  Administered 2014-01-30: 0.5 mL via INTRAMUSCULAR

## 2014-01-29 MED ORDER — ALUM & MAG HYDROXIDE-SIMETH 200-200-20 MG/5ML PO SUSP: 30.0000 mL | ORAL | Status: DC | PRN

## 2014-01-29 MED ORDER — LAMOTRIGINE 100 MG PO TABS
50.0000 mg | ORAL_TABLET | Freq: Two times a day (BID) | ORAL | Status: DC
  Administered 2014-01-29 – 2014-02-01 (×7): 50 mg via ORAL
  Filled 2014-01-29: qty 2
  Filled 2014-01-29: qty 3
  Filled 2014-01-29 (×10): qty 2
  Filled 2014-01-29: qty 3
  Filled 2014-01-29 (×3): qty 2

## 2014-01-29 MED ORDER — TRAZODONE HCL 50 MG PO TABS
50.0000 mg | ORAL_TABLET | Freq: Every evening | ORAL | Status: DC | PRN
  Filled 2014-01-29: qty 1

## 2014-01-29 MED ORDER — LISINOPRIL 10 MG PO TABS
10.0000 mg | ORAL_TABLET | Freq: Every day | ORAL | Status: DC
  Administered 2014-01-30 – 2014-01-31 (×2): 10 mg via ORAL
  Filled 2014-01-29 (×3): qty 1

## 2014-01-29 NOTE — Progress Notes (Signed)
Patient ID: Clinton Taylor, male   DOB: 02/23/1986, 28 y.o.   MRN: [id number] [facility] OBS PROGRESS NOTE  01/29/2014 3:58 PM [name]  MRN:  [id number] Subjective:  During today's assessment, pt reports that "the Lamictal is really helping a lot and I feel like I can control my thoughts so much better. I am not nearly as upset or obsessive as I was yesterday". Pt reports good sleep but states that he was woken up some by the other OBS patients. Pt wants to do outpatient counseling for his sexual obsessions vs. 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 Pt is concerned about his easy access to alcohol and blacking out and doing things that he does not remember. Pt does not have a history of being diagnosed with bipolar disorder, but with detailed discussion, pt fits this criteria without question. Pt is denying SI, HI, and AVH, contracting for safety, but having a lot of anxiety regarding alcohol addiction and abuse. Pt is agreeable to do inpatient programming/medication stabilization.    HPI: Clinton Taylor is an 28 y.o. male, married, Caucasian who presents to [facility] accompanied by his wife, who participated in the assessment with the Pt's permission. Pt reports he is experiencing severe stress and is suicidal. Pt describes having having ongoing issues since childhood with depression, obsessive thoughts, sexual preoccupation, shame, substance abuse and lying. 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 Pt reports crying spells, poor sleep, increased alcohol use, irritability, obsessive sexual thoughts and behavior, bad dreams and feelings of sadness, guilt, shame and hopelessness. Pt report suicidal ideation with plan to "shoot myself under my chin" or use an intravenous line in an artery  to bleed to death. Pt reports a history of one previous suicide attempt by cutting his wrist when he was an adolescent in response to his grandfather's death. Pt also reports a friend unexpectedly committed suicide by hanging himself two months ago. Pt denies homicidal ideation and says although he has problems with anger he has only been in two physical fights as an adolescent. He denies any psychotic symptoms.   Pt states as an adolescent he used crystal meth, cocaine and crack but he has not use any substances in years. He report binge drinking on liquor and these binges have increased in frequency. Pt reports he is currently drinking approximately 5-7 shots of liquor approximately five days per week. He reports a history of blackouts. He denies any history of alcohol withdrawal or seizures.  Pt identifies his primary problem as sexual issues that have created severe problems for his marriage. 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 He states he is attracted to women but has also has had two sexual encounters with men that he didn't find satisfying. His wife says he is sexually aroused all the time, to the point where she wonders if there is something neurologically wrong with him. Pt acknowledges there have been several "infidelities." Pt and wife also report Pt has a problem with pathological lying and that Pt will lie "about everything, even stupid little things like whether he spent $10 or $20." Pt's wife also states Pt is "angry all  the time" and Pt reports he has mood swings. Pt states he has deep feelings of shame and believes there is something wrong with him. Pt states he has obsessive negative thoughts and uses sex and alcohol to distract himself. Pt and his wife have been discussing Pt's problems recently and wife is ready for divorce. Pt reports brief outpatient therapy as an adolescent. He was prescribed  Zoloft by his PCP but did not like the way it made him feel and he stopped taking it two months ago. He has had no other outpatient mental health or substance abuse treatment. Pt has had no inpatient mental health or substance abuse treatment.    Diagnosis:   DSM5: Depressive Disorders:  Major Depressive Disorder - Severe (296.23) Total Time spent with patient: Greater than 30 minutes  Axis I: Bipolar, mixed and Major Depression, Recurrent severe Axis II: Deferred Axis III:  Past Medical History  Diagnosis Date  . Depression   . Obsessive compulsive disorder   . Anxiety   . Mood changes   . Anger    Axis IV: other psychosocial or environmental problems and problems related to social environment Axis V: 41-50 serious symptoms  ADL's:  Intact  Sleep: Fair  Appetite:  Good  Suicidal Ideation:  Denies Homicidal Ideation:  Denies AEB (as evidenced by):  Psychiatric Specialty Exam: Physical Exam Full Physical Exam performed in ED; reviewed, stable, and I concur with this assessment.   Review of Systems  Constitutional: Negative.   HENT: Negative.   Eyes: Negative.   Respiratory: Negative.   Cardiovascular: Negative.   Gastrointestinal: Negative.   Genitourinary: Negative.   Musculoskeletal: Negative.   Skin: Negative.   Neurological: Negative.   Endo/Heme/Allergies: Negative.   Psychiatric/Behavioral: Positive for depression and substance abuse. The patient is nervous/anxious and has insomnia.     Blood pressure 163/101, pulse 73, temperature 98.5 F (36.9 C), temperature source Oral, resp. rate 16, height 6' (1.829 m).There is no weight on file to calculate BMI.  General Appearance: Casual  Eye Contact::  Good  Speech:  Clear and Coherent  Volume:  Increased  Mood:  Depressed  Affect:  Appropriate  Thought Process:  Coherent  Orientation:  Full (Time, Place, and Person)  Thought Content:  Obsessions (sexual content)  Suicidal Thoughts:  No  Homicidal Thoughts:   No  Memory:  Immediate;   Fair Recent;   Fair Remote;   Fair  Judgement:  Fair  Insight:  Fair  Psychomotor Activity:  Normal  Concentration:  Fair  Recall:  Good  Fund of Knowledge:Good  Language: Good  Akathisia:  NA  Handed:  Right  AIMS (if indicated):     Assets:  Communication Skills Desire for Improvement Resilience Social Support  Sleep:      Musculoskeletal: Strength & Muscle Tone: within normal limits Gait & Station: normal Patient leans: N/A  Current Medications: Current Facility-Administered Medications  Medication Dose Route Frequency Provider Last Rate Last Dose  . acetaminophen (TYLENOL) tablet 650 mg  650 mg Oral Q6H PRN Benjamine Mola, FNP   650 mg at 01/28/14 1444  . alum & mag hydroxide-simeth (MAALOX/MYLANTA) 200-200-20 MG/5ML suspension 30 mL  30 mL Oral Q4H PRN Benjamine Mola, FNP      . alum & mag hydroxide-simeth (MAALOX/MYLANTA) 200-200-20 MG/5ML suspension 30 mL  30 mL Oral Q4H PRN Encarnacion Slates, NP      . chlordiazePOXIDE (LIBRIUM) capsule 25 mg  25 mg Oral TID PRN Benjamine Mola,  FNP   25 mg at 01/28/14 1745  . citalopram (CELEXA) tablet 20 mg  20 mg Oral Daily Benjamine Mola, FNP   20 mg at 01/29/14 0739  . cloNIDine (CATAPRES) tablet 0.2 mg  0.2 mg Oral Daily Benjamine Mola, FNP   0.2 mg at 01/29/14 0737  . hydrOXYzine (ATARAX/VISTARIL) tablet 25 mg  25 mg Oral Q6H PRN Benjamine Mola, FNP   25 mg at 01/29/14 1144  . ibuprofen (ADVIL,MOTRIN) tablet 600 mg  600 mg Oral Q6H PRN Benjamine Mola, FNP      . lamoTRIgine (LAMICTAL) tablet 50 mg  50 mg Oral BID Benjamine Mola, FNP      . [START ON 01/30/2014] lisinopril (PRINIVIL,ZESTRIL) tablet 10 mg  10 mg Oral Daily Encarnacion Slates, NP      . lisinopril (PRINIVIL,ZESTRIL) tablet 20 mg  20 mg Oral Once Encarnacion Slates, NP      . magnesium hydroxide (MILK OF MAGNESIA) suspension 30 mL  30 mL Oral Daily PRN Benjamine Mola, FNP      . nicotine (NICODERM CQ - dosed in mg/24 hours) patch 21 mg  21 mg Transdermal  Q0600 Benjamine Mola, FNP   21 mg at 01/29/14 0740  . [START ON 01/30/2014] pneumococcal 23 valent vaccine (PNU-IMMUNE) injection 0.5 mL  0.5 mL Intramuscular Tomorrow-1000 Nicholaus Bloom, MD      . traZODone (DESYREL) tablet 50 mg  50 mg Oral QHS,MR X 1 Benjamine Mola, FNP   50 mg at 01/28/14 2115  . traZODone (DESYREL) tablet 50 mg  50 mg Oral QHS PRN Encarnacion Slates, NP        Lab Results:  Results for orders placed during the hospital encounter of 01/27/14 (from the past 48 hour(s))  CBC WITH DIFFERENTIAL     Status: Abnormal   Collection Time    01/27/14  5:26 PM      Result Value Ref Range   WBC 14.5 (*) 4.0 - 10.5 K/uL   RBC 5.50  4.22 - 5.81 MIL/uL   Hemoglobin 16.8  13.0 - 17.0 g/dL   HCT 46.4  39.0 - 52.0 %   MCV 84.4  78.0 - 100.0 fL   MCH 30.5  26.0 - 34.0 pg   MCHC 36.2 (*) 30.0 - 36.0 g/dL   RDW 12.5  11.5 - 15.5 %   Platelets 250  150 - 400 K/uL   Neutrophils Relative % 72  43 - 77 %   Neutro Abs 10.5 (*) 1.7 - 7.7 K/uL   Lymphocytes Relative 18  12 - 46 %   Lymphs Abs 2.6  0.7 - 4.0 K/uL   Monocytes Relative 8  3 - 12 %   Monocytes Absolute 1.1 (*) 0.1 - 1.0 K/uL   Eosinophils Relative 2  0 - 5 %   Eosinophils Absolute 0.3  0.0 - 0.7 K/uL   Basophils Relative 0  0 - 1 %   Basophils Absolute 0.1  0.0 - 0.1 K/uL  COMPREHENSIVE METABOLIC PANEL     Status: Abnormal   Collection Time    01/27/14  5:26 PM      Result Value Ref Range   Sodium 142  137 - 147 mEq/L   Potassium 3.8  3.7 - 5.3 mEq/L   Chloride 100  96 - 112 mEq/L   CO2 24  19 - 32 mEq/L   Glucose, Bld 125 (*) 70 - 99 mg/dL   BUN  11  6 - 23 mg/dL   Creatinine, Ser 1.02  0.50 - 1.35 mg/dL   Calcium 9.7  8.4 - 10.5 mg/dL   Total Protein 7.4  6.0 - 8.3 g/dL   Albumin 4.2  3.5 - 5.2 g/dL   AST 20  0 - 37 U/L   ALT 15  0 - 53 U/L   Alkaline Phosphatase 88  39 - 117 U/L   Total Bilirubin 0.2 (*) 0.3 - 1.2 mg/dL   GFR calc non Af Amer >90  >90 mL/min   GFR calc Af Amer >90  >90 mL/min   Comment: (NOTE)      The eGFR has been calculated using the CKD EPI equation.     This calculation has not been validated in all clinical situations.     eGFR's persistently <90 mL/min signify possible Chronic Kidney     Disease.  ETHANOL     Status: None   Collection Time    01/27/14  5:26 PM      Result Value Ref Range   Alcohol, Ethyl (B) <11  0 - 11 mg/dL   Comment:            LOWEST DETECTABLE LIMIT FOR     SERUM ALCOHOL IS 11 mg/dL     FOR MEDICAL PURPOSES ONLY  TROPONIN I     Status: None   Collection Time    01/27/14  5:26 PM      Result Value Ref Range   Troponin I <0.30  <0.30 ng/mL   Comment:            Due to the release kinetics of cTnI,     a negative result within the first hours     of the onset of symptoms does not rule out     myocardial infarction with certainty.     If myocardial infarction is still suspected,     repeat the test at appropriate intervals.  D-DIMER, QUANTITATIVE     Status: None   Collection Time    01/27/14  7:25 PM      Result Value Ref Range   D-Dimer, Quant <0.27  0.00 - 0.48 ug/mL-FEU   Comment:            AT THE INHOUSE ESTABLISHED CUTOFF     VALUE OF 0.48 ug/mL FEU,     THIS ASSAY HAS BEEN DOCUMENTED     IN THE LITERATURE TO HAVE     A SENSITIVITY AND NEGATIVE     PREDICTIVE VALUE OF AT LEAST     98 TO 99%.  THE TEST RESULT     SHOULD BE CORRELATED WITH     AN ASSESSMENT OF THE CLINICAL     PROBABILITY OF DVT / VTE.  TROPONIN I     Status: None   Collection Time    01/27/14  7:25 PM      Result Value Ref Range   Troponin I <0.30  <0.30 ng/mL   Comment:            Due to the release kinetics of cTnI,     a negative result within the first hours     of the onset of symptoms does not rule out     myocardial infarction with certainty.     If myocardial infarction is still suspected,     repeat the test at appropriate intervals.  URINE RAPID DRUG SCREEN (HOSP PERFORMED)     Status: None  Collection Time    01/27/14 10:30 PM      Result Value  Ref Range   Opiates NONE DETECTED  NONE DETECTED   Cocaine NONE DETECTED  NONE DETECTED   Benzodiazepines NONE DETECTED  NONE DETECTED   Amphetamines NONE DETECTED  NONE DETECTED   Tetrahydrocannabinol NONE DETECTED  NONE DETECTED   Barbiturates NONE DETECTED  NONE DETECTED   Comment:            DRUG SCREEN FOR MEDICAL PURPOSES     ONLY.  IF CONFIRMATION IS NEEDED     FOR ANY PURPOSE, NOTIFY LAB     WITHIN 5 DAYS.                LOWEST DETECTABLE LIMITS     FOR URINE DRUG SCREEN     Drug Class       Cutoff (ng/mL)     Amphetamine      1000     Barbiturate      200     Benzodiazepine   688     Tricyclics       648     Opiates          300     Cocaine          300     THC              50    Physical Findings: AIMS: Facial and Oral Movements Muscles of Facial Expression: None, normal Lips and Perioral Area: None, normal Jaw: None, normal Tongue: None, normal,Extremity Movements Upper (arms, wrists, hands, fingers): None, normal Lower (legs, knees, ankles, toes): None, normal, Trunk Movements Neck, shoulders, hips: None, normal, Overall Severity Severity of abnormal movements (highest score from questions above): None, normal Incapacitation due to abnormal movements: None, normal Patient's awareness of abnormal movements (rate only patient's report): No Awareness, Dental Status Current problems with teeth and/or dentures?: No Does patient usually wear dentures?: No  CIWA:  CIWA-Ar Total: 2 COWS:     Treatment Plan Summary: Daily contact with patient to assess and evaluate symptoms and progress in treatment Medication management  Plan: Review of chart, vital signs, medications, and notes.  1-Individual and group therapy  2-Medication management for depression and anxiety: Medications reviewed with the patient and she stated no untoward effects.  -Increase Lamictal to 100m bid for therapeutic dose -Continue Trazodone 512mqhs PRN for insomnia -Continue Celexa 2061maily  for depression  3-Coping skills for depression, anxiety  4-Continue crisis stabilization and management  5-Address health issues--monitoring vital signs, stable  6-Treatment plan in progress to prevent relapse of depression and anxiety  Medical Decision Making Problem Points:  Established problem, stable/improving (1), Review of last therapy session (1) and Review of psycho-social stressors (1) Data Points:  Review or order clinical lab tests (1) Review or order medicine tests (1) Review of medication regiment & side effects (2) Review of new medications or change in dosage (2)   JohBenjamine MolaNP-BC 01/29/2014, 3:58 PM

## 2014-01-29 NOTE — Progress Notes (Signed)
Recreation Therapy Notes  Animal-Assisted Activity/Therapy (AAA/T) Program Checklist/Progress Notes Patient Eligibility Criteria Checklist & Daily Group note for Rec Tx Intervention  Date: 04.14.2015 Time: 2:45am [address]    AAA/T Program Assumption of Risk Form signed by Patient/ or Parent Legal Guardian yes  Patient is free of allergies or sever asthma yes  Patient reports no fear of animals yes  Patient reports no history of cruelty to animals yes   Patient understands his/her participation is voluntary yes  Behavioral Response: Did not attend.   Marykay Lexenise L Dillan Candela, LRT/CTRS  Aerial Dilley L Laramie Meissner 01/29/2014 4:55 PM

## 2014-01-29 NOTE — Tx Team (Signed)
Initial Interdisciplinary Treatment Plan  PATIENT STRENGTHS: (choose at least two) Ability for insight Average or above average intelligence Communication skills Financial means General fund of knowledge Motivation for treatment/growth Physical Health Special hobby/interest Supportive family/friends Work skills  PATIENT STRESSORS: Marital or family conflict Substance abuse   PROBLEM LIST: Problem List/Patient Goals Date to be addressed Date deferred Reason deferred Estimated date of resolution  Substance abuse 01/29/14     Family conflict 01/29/14                                                DISCHARGE CRITERIA:  Ability to meet basic life and health needs Adequate post-discharge living arrangements Improved stabilization in mood, thinking, and/or behavior Medical problems require only outpatient monitoring Motivation to continue treatment in a less acute level of care Need for constant or close observation no longer present Reduction of life-threatening or endangering symptoms to within safe limits Safe-care adequate arrangements made Verbal commitment to aftercare and medication compliance Withdrawal symptoms are absent or subacute and managed without 24-hour nursing intervention  PRELIMINARY DISCHARGE PLAN: Attend aftercare/continuing care group Attend 12-step recovery group Outpatient therapy Return to previous living arrangement Return to previous work or school arrangements  PATIENT/FAMIILY INVOLVEMENT: This treatment plan has been presented to and reviewed with the patient, Clinton Taylor, and/or family member,   The patient and family have been given the opportunity to ask questions and make suggestions.  Clinton Taylor 01/29/2014, 2:14 PM

## 2014-01-29 NOTE — [facility] Group Notes (Signed)
[facility] LCSW Group Therapy  01/29/2014 3:01 PM  Type of Therapy:  Group Therapy  Participation Level:  Did Not Attend-pt new to unit/resting in room during group.   Rosalin Buster Smart LCSWA  01/29/2014, 3:01 PM

## 2014-01-29 NOTE — Progress Notes (Signed)
D: Patient in the hallway on approach.  Patient states he wants to get his life together for himself and his family.  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  Patient states he is going to quit drinking alcohol for good and stay away from it so he will not be tempted.  Patient received a list of his medications tonight.  Patient denies SI/HI and denies AVH. A: Staff to monitor Q 15 mins for safety.  Encouragement and support offered.  Scheduled medications administered per orders. Librium administered prn tonight for tremors.  Vistaril administered prn for sleep.   R: Patient remains safe on the unit.  Patient attended group tonight.  Patient visible on the unit and interacting with peers.  Patient taking administered medications.

## 2014-01-29 NOTE — [facility] Group Notes (Signed)
Adult Psychoeducational Group Note  Date:  01/29/2014 Time:  10:23 PM  Group Topic/Focus:  AA Meeting  Participation Level:  Minimal  Participation Quality:  Appropriate  Affect:  Appropriate  Cognitive:  Appropriate  Insight: Limited  Engagement in Group:  Limited  Modes of Intervention:  Discussion and Education  Additional Comments:  Judie GrieveBryan attended AA group.  Cornelius Schuitema A Lillia AbedLindsay 01/29/2014, 10:23 PM

## 2014-01-29 NOTE — Progress Notes (Signed)
Patient ID: Clinton Taylor, male   DOB: 1986/07/20, 28 y.o.   MRN: [id number] Complained of increased anxiety and requested medication to help relieve the anxeity. Order available for Vistaril. Vitals do not indicate withdrawal sx, BP 140/91 p-61 Approx 45 min later verbalized feeling better, less anxious  And gives himself a three in feelings of wellness, 10 being horrible. He has spoken with Renata Capriceonrad, the NP today and the plan for him today is to transfer to the adult 300 hall to complete detox.He is happy with this disposition.he has been cooperative and pleasant. He is currently denying suicidal ideation, and is not homicidal. He denies and there is no evidence of psychosis.He has called his wife to give her the update.

## 2014-01-29 NOTE — Progress Notes (Signed)
Pt admitted from observation unit. Vitals 167/101 with a pulse of 73. Reported to NP and no new orders at this time. Pt denies detox symptoms at this time. Pt reports that he got married in 2008 and has three children. He works as a Production designer, theatre/television/filmmaintenance and owns a sports bar. He has a hx of cocaine and meth use and was introduce to cocaine by his father. Pt went through rehab several years ago. He reports that he stared drinking and was unfaithful to his wife. This has led to increased depression and guilt. He drinks until he passes out and does not remember some things that he has done. Pt denies suicidal thoughts at this time. He denies hi and denies any hallucinations.

## 2014-01-30 DIAGNOSIS — R45851 Suicidal ideations: Secondary | ICD-10-CM

## 2014-01-30 DIAGNOSIS — F1994 Other psychoactive substance use, unspecified with psychoactive substance-induced mood disorder: Secondary | ICD-10-CM

## 2014-01-30 DIAGNOSIS — F1020 Alcohol dependence, uncomplicated: Secondary | ICD-10-CM | POA: Diagnosis present

## 2014-01-30 MED ORDER — TRIAMCINOLONE ACETONIDE 0.1 % EX CREA
TOPICAL_CREAM | Freq: Two times a day (BID) | CUTANEOUS | Status: DC
  Administered 2014-01-30 – 2014-02-01 (×4): via TOPICAL
  Filled 2014-01-30 (×3): qty 15

## 2014-01-30 MED ORDER — TRIAMCINOLONE ACETONIDE 0.1 % EX CREA: TOPICAL_CREAM | Freq: Two times a day (BID) | CUTANEOUS | Status: DC

## 2014-01-30 MED ORDER — CLONIDINE HCL 0.2 MG PO TABS
0.2000 mg | ORAL_TABLET | Freq: Two times a day (BID) | ORAL | Status: DC
  Administered 2014-01-30 – 2014-02-01 (×5): 0.2 mg via ORAL
  Filled 2014-01-30: qty 2
  Filled 2014-01-30 (×8): qty 1

## 2014-01-30 NOTE — Progress Notes (Addendum)
Patient ID: Clinton Taylor, male   DOB: 11-Apr-1986, 28 y.o.   MRN: [id number]  D: Pt. Denies SI/HI and A/V Hallucinations. Patient rates his depression and hopelessness at 3/10 for the day. Patient does not report any pain or discomfort at this time.   A: Support and encouragement provided to the patient to come to writer with any questions or concerns. Scheduled medications given to patient per physician's orders. Patient received IM Pneumococcal vaccination this morning with no adverse side effects.  R: Patient is receptive and cooperative but minimal. Patient is seen in the milieu and is attending groups. Q15 minute checks are maintained for safety.

## 2014-01-30 NOTE — Progress Notes (Signed)
D:Patient states he had a good day but states he went to the NA meeting tonight and states he felt the presenters were too young.  Patient states overall his day was good.  Patient states he is learning to work on himself first before trying to help others.  Patient denies SI/HI and denies AVH. A: Staff to monitor Q 15 mins for safety.  Encouragement and support offered.  Scheduled medications administered per orders.  Vistaril administered prn for anxiety. R: Patient remains safe on the unit.  Patient attended group tonight.  Patient visible on the unit and interacting with peers.  Patient taking administered medications.

## 2014-01-30 NOTE — Progress Notes (Signed)
Attended group 
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
## 2014-01-30 NOTE — Progress Notes (Signed)
Patient needs to be admitted to [facility] 

## 2014-01-30 NOTE — [facility] Group Notes (Signed)
[facility] LCSW Group Therapy  01/30/2014 3:00 PM  Type of Therapy:  Group Therapy  Participation Level:  Active  Participation Quality:  Attentive  Affect:  Appropriate  Cognitive:  Alert and Oriented  Insight:  Engaged  Engagement in Therapy:  Engaged  Modes of Intervention:  Confrontation, Discussion, Education, Exploration, Problem-solving, Rapport Building, Socialization and Support  Summary of Progress/Problems: Emotion Regulation: This group focused on both positive and negative emotion identification and allowed group members to process ways to identify feelings, regulate negative emotions, and find healthy ways to manage internal/external emotions. Group members were asked to reflect on a time when their reaction to an emotion led to a negative outcome and explored how alternative responses using emotion regulation would have benefited them. Group members were also asked to discuss a time when emotion regulation was utilized when a negative emotion was experienced. Clinton Taylor was attentive and engaged throughout today's therapy group. He shared that he struggles with anger issues and "takes frustration home from work and takes it out on my family." Clinton Taylor shows progress in the group setting and improving insight AEB his ability to process how learning how to better balance work/personal life and take time for himself to focus on recovery-exercising/going to therapy/spending time with wife will help him regulate anger in a healthier way.    Clinton Taylor Smart LCSWA  01/30/2014, 3:00 PM
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
## 2014-01-30 NOTE — [facility] Suicide Risk Assessment (Signed)
Suicide Risk Assessment  Admission Assessment     Nursing information obtained from:  Patient Demographic factors:  Male;Adolescent or young adult;Caucasian Current Mental Status:    Loss Factors:    Historical Factors:  Prior suicide attempts;Impulsivity (alcohol use) Risk Reduction Factors:  Responsible for children under 30 years of age;Sense of responsibility to family;Living with another person, especially a relative Total Time spent with patient: 1 hour  CLINICAL FACTORS:   Bipolar Disorder:   Mixed State Alcohol/Substance Abuse/Dependencies  PCOGNITIVE FEATURES THAT CONTRIBUTE TO RISK:  Closed-mindedness Polarized thinking Thought constriction (tunnel vision)    SUICIDE RISK:   Moderate:  Frequent suicidal ideation with limited intensity, and duration, some specificity in terms of plans, no associated intent, good self-control, limited dysphoria/symptomatology, some risk factors present, and identifiable protective factors, including available and accessible social support.  PLAN OF CARE: Supportive approach/coping skills/relapse prevention                               Optimize treatment of co morbidities  I certify that inpatient services furnished can reasonably be expected to improve the patient's condition.  Rachael Feerving A Ledford Goodson 01/30/2014, 3:33 PM

## 2014-01-30 NOTE — [facility] Group Notes (Signed)
[facility]   01/30/2014 10:48 AM  Participation Quality:  Appropriate   Mood/Affect:  Appropriate  Depression Rating:  2  Anxiety Rating:  0  Thoughts of Suicide:  No Will you contract for safety?   NA  Current AVH:  No  Plan for Discharge/Comments:  Pt reports that he lives with his wife and three kids. [facility] but is interested in seeing psychiatrist and therapist in [place] area. Pt reports that meds for bipolar d/o are helping him significantly. Pt concerned about testosterone levels and requested that MD check on this.   Transportation Means: wife   Supports: wife/kids/mother  Teaching laboratory technicianHeather Smart LCSWA

## 2014-01-30 NOTE — Tx Team (Signed)
Interdisciplinary Treatment Plan Update (Adult)  Date: 01/30/2014   Time Reviewed: 10:50 AM  Progress in Treatment:  Attending groups: Yes  Participating in groups:  Yes  Taking medication as prescribed: Yes  Tolerating medication: Yes  Family/Significant othe contact made: Not yet. SPE required for this pt.  Patient understands diagnosis: Yes, AEB seeking treatment for etoh detox, bipolar disorder/med management, SI, and mood stabilization.  Discussing patient identified problems/goals with staff: Yes  Medical problems stabilized or resolved: Yes  Denies suicidal/homicidal ideation: Yes during group/self report.  Patient has not harmed self or Others: Yes  New problem(s) identified:  Discharge Plan or Barriers: Pt requesting referral for therapist/psychiatrist in [place].  Additional comments:   Pt admitted from observation unit. Vitals 167/101 with a pulse of 73. Reported to NP and no new orders at this time. Pt denies detox symptoms at this time. Pt reports that he got married in 2008 and has three children. He works as a Production designer, theatre/television/filmmaintenance and owns a sports bar. He has a hx of cocaine and meth use and was introduce to cocaine by his father. Pt went through rehab several years ago. He reports that he stared drinking and was unfaithful to his wife. This has led to increased depression and guilt. He drinks until he passes out and does not remember some things that he has done. Pt denies suicidal thoughts at this time. He denies hi and denies any hallucinations.      Reason for Continuation of Hospitalization: Mood stabilization Medication management Estimated length of stay: 3-4 days  For review of initial/current patient goals, please see plan of care.  Attendees:  Patient:    Family:    Physician: Geoffery LyonsIrving Lugo MD 01/30/2014 10:50 AM   Nursing: Maureen RalphsVivian RN  01/30/2014 10:50 AM   Clinical Social Worker [employer] Sherwin-WilliamsHeather Smart, LCSWA  01/30/2014 10:50 AM   Other: Christa RN  01/30/2014 10:50 AM   Other:  Darden DatesJennifer C. Nurse CM 01/30/2014 10:50 AM   Other: Massie Kluverelores Sutton, Community Care Coordinator  01/30/2014 10:50 AM   Other: Chandra BatchAggie N. PA 01/30/2014 10:50 AM   Scribe for Treatment Team:  [employer] Sherwin-WilliamsHeather Smart LCSWA 01/30/2014 10:50 AM

## 2014-01-31 MED ORDER — LISINOPRIL 20 MG PO TABS
20.0000 mg | ORAL_TABLET | Freq: Every day | ORAL | Status: DC
  Administered 2014-02-01: 20 mg via ORAL
  Filled 2014-01-31: qty 3
  Filled 2014-01-31 (×3): qty 1

## 2014-01-31 MED ORDER — TRAZODONE HCL 100 MG PO TABS
100.0000 mg | ORAL_TABLET | Freq: Every evening | ORAL | Status: DC | PRN
  Administered 2014-01-31: 100 mg via ORAL
  Filled 2014-01-31 (×2): qty 1
  Filled 2014-01-31: qty 6
  Filled 2014-01-31 (×3): qty 1
  Filled 2014-01-31: qty 6
  Filled 2014-01-31: qty 1

## 2014-01-31 MED ORDER — LISINOPRIL 10 MG PO TABS
10.0000 mg | ORAL_TABLET | Freq: Once | ORAL | Status: AC
  Administered 2014-01-31: 10 mg via ORAL
  Filled 2014-01-31: qty 1
  Filled 2014-01-31: qty 2

## 2014-01-31 NOTE — [facility] Group Notes (Signed)
[facility] LCSW Group Therapy  01/31/2014 2:33 PM  Type of Therapy:  Group Therapy  Participation Level:  Active  Participation Quality:  Attentive  Affect:  Appropriate  Cognitive:  Alert and Oriented  Insight:  Engaged  Engagement in Therapy:  Engaged  Modes of Intervention:  Confrontation, Discussion, Education, Exploration, Problem-solving, Rapport Building, Socialization and Support  Summary of Progress/Problems:  Finding Balance in Life. Today's group focused on defining balance in one's own words, identifying things that can knock one off balance, and exploring healthy ways to maintain balance in life. Group members were asked to provide an example of a time when they felt off balance, describe how they handled that situation,and process healthier ways to regain balance in the future. Group members were asked to share the most important tool for maintaining balance that they learned [facility] and how they plan to apply this method after discharge. Clinton Taylor was attentive and engaged throughout today's therapy group. He shared that he must learn how to manage his work life, family life, and private time better. Clinton Taylor shows progress in the group setting and improving insight AEB his ability to process how sobriety and addressing mental health issues is top priority due to its' affects on all facets of his life.      Clinton Taylor LCSWA  01/31/2014, 2:33 PM

## 2014-01-31 NOTE — Progress Notes (Signed)
Patient ID: Langley GaussBryan Tinner, male   DOB: 1986-09-02, 28 y.o.   MRN: [id number] He has been up and about and to groups interacting with peers and staff. Se;f inventory: depression 2, hopelessness 3, withdrawals of tremors, Denies SI thoughts.

## 2014-01-31 NOTE — Progress Notes (Signed)
Adult Psychoeducational Group Note  Date:  01/31/2014 Time:  10:50 PM  Group Topic/Focus:  Wrap-Up Group:   The focus of this group is to help patients review their daily goal of treatment and discuss progress on daily workbooks.  Participation Level:  Active  Participation Quality:  Appropriate  Affect:  Appropriate  Cognitive:  Appropriate  Insight: Appropriate  Engagement in Group:  Engaged  Modes of Intervention:  Activity  Additional Comments:  Pt attended karaoke group in cafeteria.  Danielle RankinLequisha D Suhail Taylor 01/31/2014, 10:50 PM

## 2014-01-31 NOTE — Progress Notes (Signed)
[facility]  01/31/2014 2:36 PM Langley GaussBryan Resende  MRN:  [id number] Subjective:  Clinton GrieveBryan states he is starting to feel better. He states he is less depressed and that he is ready to make the necessary changes in his life to abstain have a better balance. Can see how he allowed things to get out of control. Feels that the medications are working for him. Did not sleep too well last night. Will like to have the medication increased Diagnosis:   DSM5: Schizophrenia Disorders:  none Obsessive-Compulsive Disorders:  none Trauma-Stressor Disorders:  none Substance/Addictive Disorders:  Alcohol Related Disorder - Severe (303.90) Depressive Disorders:  Major Depressive Disorder - Moderate (296.22) Total Time spent with patient: 30 minutes  Axis I: Bipolar, mixed  ADL's:  Intact  Sleep: Poor  Appetite:  Fair  Suicidal Ideation:  Plan:  denies Intent:  denies Means:  denies Homicidal Ideation:  Plan:  denies Intent:  denies Means:  denies AEB (as evidenced by):  Psychiatric Specialty Exam: Physical Exam  Review of Systems  Constitutional: Negative.   HENT: Negative.   Eyes: Negative.   Respiratory: Negative.   Cardiovascular: Negative.   Gastrointestinal: Negative.   Genitourinary: Negative.   Musculoskeletal: Negative.   Skin: Negative.   Neurological: Negative.   Endo/Heme/Allergies: Negative.   Psychiatric/Behavioral: Positive for substance abuse.    Blood pressure 145/84, pulse 70, temperature 98.1 F (36.7 C), temperature source Oral, resp. rate 20, height 6' (1.829 m).There is no weight on file to calculate BMI.  General Appearance: Fairly Groomed  Patent attorneyye Contact::  Fair  Speech:  Clear and Coherent  Volume:  Normal  Mood:  Anxious and worried  Affect:  Appropriate  Thought Process:  Coherent and Goal Directed  Orientation:  Full (Time, Place, and Person)  Thought Content:  events in his life, symptoms  Suicidal Thoughts:  No  Homicidal Thoughts:  No   Memory:  Immediate;   Fair Recent;   Fair Remote;   Fair  Judgement:  Fair  Insight:  Present  Psychomotor Activity:  Restlessness  Concentration:  Fair  Recall:  FiservFair  Fund of Knowledge:NA  Language: Fair  Akathisia:  No  Handed:    AIMS (if indicated):     Assets:  Desire for Improvement Housing Social Support Transportation Vocational/Educational  Sleep:  Number of Hours: 5   Musculoskeletal: Strength & Muscle Tone: within normal limits Gait & Station: normal Patient leans: N/A  Current Medications: Current Facility-Administered Medications  Medication Dose Route Frequency Provider Last Rate Last Dose  . acetaminophen (TYLENOL) tablet 650 mg  650 mg Oral Q6H PRN Beau FannyJohn C Withrow, FNP   650 mg at 01/30/14 1454  . alum & mag hydroxide-simeth (MAALOX/MYLANTA) 200-200-20 MG/5ML suspension 30 mL  30 mL Oral Q4H PRN Beau FannyJohn C Withrow, FNP      . alum & mag hydroxide-simeth (MAALOX/MYLANTA) 200-200-20 MG/5ML suspension 30 mL  30 mL Oral Q4H PRN Sanjuana KavaAgnes I Nwoko, NP      . chlordiazePOXIDE (LIBRIUM) capsule 25 mg  25 mg Oral TID PRN Beau FannyJohn C Withrow, FNP   25 mg at 01/30/14 1830  . citalopram (CELEXA) tablet 20 mg  20 mg Oral Daily Beau FannyJohn C Withrow, FNP   20 mg at 01/31/14 40980823  . cloNIDine (CATAPRES) tablet 0.2 mg  0.2 mg Oral BID Beau FannyJohn C Withrow, FNP   0.2 mg at 01/31/14 11910823  . hydrOXYzine (ATARAX/VISTARIL) tablet 25 mg  25 mg Oral Q6H PRN Beau FannyJohn C Withrow, FNP   25  mg at 01/30/14 2118  . ibuprofen (ADVIL,MOTRIN) tablet 600 mg  600 mg Oral Q6H PRN Beau FannyJohn C Withrow, FNP      . lamoTRIgine (LAMICTAL) tablet 50 mg  50 mg Oral BID Beau FannyJohn C Withrow, FNP   50 mg at 01/31/14 [id number]  . [START ON 02/01/2014] lisinopril (PRINIVIL,ZESTRIL) tablet 20 mg  20 mg Oral Daily Rachael FeeIrving A Damyia Strider, MD      . magnesium hydroxide (MILK OF MAGNESIA) suspension 30 mL  30 mL Oral Daily PRN Beau FannyJohn C Withrow, FNP      . nicotine (NICODERM CQ - dosed in mg/24 hours) patch 21 mg  21 mg Transdermal Q0600 Beau FannyJohn C Withrow, FNP   21 mg at  01/31/14 0610  . traZODone (DESYREL) tablet 100 mg  100 mg Oral QHS,MR X 1 Rachael FeeIrving A Marks Scalera, MD      . triamcinolone cream (KENALOG) 0.1 %   Topical BID Rachael FeeIrving A Electra Paladino, MD        Lab Results: No results found for this or any previous visit (from the past 48 hour(s)).  Physical Findings: AIMS: Facial and Oral Movements Muscles of Facial Expression: None, normal Lips and Perioral Area: None, normal Jaw: None, normal Tongue: None, normal,Extremity Movements Upper (arms, wrists, hands, fingers): None, normal Lower (legs, knees, ankles, toes): None, normal, Trunk Movements Neck, shoulders, hips: None, normal, Overall Severity Severity of abnormal movements (highest score from questions above): None, normal Incapacitation due to abnormal movements: None, normal Patient's awareness of abnormal movements (rate only patient's report): No Awareness, Dental Status Current problems with teeth and/or dentures?: No Does patient usually wear dentures?: No  CIWA:  CIWA-Ar Total: 2 COWS:     Treatment Plan Summary: Daily contact with patient to assess and evaluate symptoms and progress in treatment Medication management  Plan: Supportive approach/coping skills/relapse prevention           Optimize treatment with psychotropics           Increase the Trazodone to 100 mg HS  Medical Decision Making Problem Points:  Review of psycho-social stressors (1) Data Points:  Review of medication regiment & side effects (2) Review of new medications or change in dosage (2)  I certify that inpatient services furnished can reasonably be expected to improve the patient's condition.   Rachael Feerving A Chayla Shands 01/31/2014, 2:36 PM

## 2014-01-31 NOTE — [facility] Suicide Risk Assessment (Signed)
[facility] INPATIENT:  Family/Significant Other Suicide Prevention Education  Suicide Prevention Education:  Education Completed: Kenton KingfisherHeather Vanderhoff (pts wife) [phone] has been identified by the patient as the family member/significant other with whom the patient will be residing, and identified as the person(s) who will aid the patient in the event of a mental health crisis (suicidal ideations/suicide attempt).  With written consent from the patient, the family member/significant other has been provided the following suicide prevention education, prior to the and/or following the discharge of the patient.  The suicide prevention education provided includes the following:  Suicide risk factors  Suicide prevention and interventions  National Suicide Hotline telephone number  [facility] assessment telephone number  [facility] Emergency Assistance 911  [facility] and/or Residential Mobile Crisis Unit telephone number  Request made of family/significant other to:  Remove weapons (e.g., guns, rifles, knives), all items previously/currently identified as safety concern.    Remove drugs/medications (over-the-counter, prescriptions, illicit drugs), all items previously/currently identified as a safety concern.  The family member/significant other verbalizes understanding of the suicide prevention education information provided.  The family member/significant other agrees to remove the items of safety concern listed above.  Breyon Sigg Smart LCSWA 01/31/2014, 12:44 PM

## 2014-01-31 NOTE — [facility] Group Notes (Signed)
[facility] Group Notes:  (Nursing/MHT/Case Management/Adjunct)  Date:  01/31/2014  Time:  9:35 AM  Type of Therapy:  Psychoeducational Skills  Participation Level:  Active  Participation Quality:  Appropriate  Affect:  Appropriate  Cognitive:  Alert and Appropriate  Insight:  Appropriate and Good  Engagement in Group:  Engaged and Supportive  Modes of Intervention:  Clarification and Problem-solving  Summary of Progress/Problems: Morning Wellness; Engaged and giving good advice to group  Baker Hughes IncorporatedJennifer Hundley Aidee Latimore 01/31/2014, 9:35 AM

## 2014-02-01 DIAGNOSIS — F1020 Alcohol dependence, uncomplicated: Secondary | ICD-10-CM

## 2014-02-01 MED ORDER — LISINOPRIL 20 MG PO TABS: 20.0000 mg | ORAL_TABLET | Freq: Every day | ORAL | Status: DC

## 2014-02-01 MED ORDER — LAMOTRIGINE 25 MG PO TABS: 50.0000 mg | ORAL_TABLET | Freq: Two times a day (BID) | ORAL | Status: DC

## 2014-02-01 MED ORDER — IBUPROFEN 200 MG PO TABS: 200.0000 mg | ORAL_TABLET | Freq: Four times a day (QID) | ORAL | Status: DC | PRN

## 2014-02-01 MED ORDER — HYDROXYZINE HCL 25 MG PO TABS: ORAL_TABLET | ORAL | Status: DC

## 2014-02-01 MED ORDER — TRIAMCINOLONE ACETONIDE 0.1 % EX CREA: TOPICAL_CREAM | Freq: Two times a day (BID) | CUTANEOUS | Status: DC

## 2014-02-01 MED ORDER — TRAZODONE HCL 100 MG PO TABS: 100.0000 mg | ORAL_TABLET | Freq: Every evening | ORAL | Status: DC | PRN

## 2014-02-01 MED ORDER — HYDROXYZINE HCL 25 MG PO TABS
25.0000 mg | ORAL_TABLET | Freq: Three times a day (TID) | ORAL | Status: DC | PRN
  Filled 2014-02-01: qty 30

## 2014-02-01 MED ORDER — CITALOPRAM HYDROBROMIDE 20 MG PO TABS: 20.0000 mg | ORAL_TABLET | Freq: Every day | ORAL | Status: DC

## 2014-02-01 MED ORDER — CLONIDINE HCL 0.2 MG PO TABS: 0.2000 mg | ORAL_TABLET | Freq: Every day | ORAL | Status: DC
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
## 2014-02-01 NOTE — Progress Notes (Signed)
Adult Psychoeducational Group Note  Date:  02/01/2014 Time:  1:32 PM  Group Topic/Focus:  Early Warning Signs:   The focus of this group is to help patients identify signs or symptoms they exhibit before slipping into an unhealthy state or crisis.  Participation Level:  Active  Participation Quality:  Appropriate, Attentive, Sharing and Supportive  Affect:  Appropriate  Cognitive:  Appropriate  Insight: Appropriate  Engagement in Group:  Engaged and Supportive  Modes of Intervention:  Discussion, Education and Support  Additional Comments:  Pts discussed early warning signs. Pt stated his warning signs are feelings of guilt, being stressed out, and making excuses. Pt states he enjoys shooting archery, bike riding, lifting weights, and avoid situations or events where alcohol is available when he notices his warning signs.  Clinton CorwinDana Taylor Clinton Taylor 02/01/2014, 1:32 PM
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
## 2014-02-01 NOTE — Progress Notes (Signed)
[facility] Group Notes:  (Nursing/MHT/Case Management/Adjunct)  Date:  02/01/2014  Time:  3:19 PM  Type of Therapy:  Therapeutic Activity  Participation Level:  Active  Participation Quality:  Appropriate  Affect:  Appropriate  Cognitive:  Appropriate  Insight:  Appropriate  Engagement in Group:  Engaged and Supportive  Modes of Intervention:  Activity  Summary of Progress/Problems: Pts played a game using the Therapeutic Activity Ball.  Jeliyah Middlebrooks C Winson Eichorn 02/01/2014, 3:19 PM 

## 2014-02-01 NOTE — Progress Notes (Signed)
Patient ID: Clinton Taylor, male   DOB: 1985-11-16, 28 y.o.   MRN: [id number] D: Took over patient's care @ 0330. Patient in bed sleeping. Respiration regular and unlabored. No sign of distress noted at this time A: 15 mins checks for safety. R: Patient is safe.

## 2014-02-01 NOTE — Progress Notes (Signed)
[facility] :  Will you be returning to the same living situation after discharge: Yes,  returning home At discharge, do you have transportation home?:Yes,  wife will pick pt up Do you have the ability to pay for your medications:Yes,  provided prescriptions and pt verbalizes ability to afford meds  Release of information consent forms completed and in the chart;  Patient's signature needed at discharge.  Patient to Follow up at: Follow-up Information   Follow up with [facility] (Therapy) On 02/26/2014. (Appt. for therapy with Peggy Bynum at 10:00AM. )    Contact information:   [address] [place], [place] [address] Phone: [phone] Fax: x      Follow up with [facility] (Medication Management) On 02/11/2014. (Appt. with Dr. Tenny Crawoss for medication management at 8:45AM. Please bring Insurance card and completed "New Patient Packet" to this visit.)    Contact information:   [address] [place], [place] [address] Phone: [phone] Fax: x      Patient denies SI/HI:   Yes,  denies SI/HI    Safety Planning and Suicide Prevention discussed:  Yes,  discussed with pt and pt's wife.  See suicide prevention education note.   Kendell Sagraves N Horton 02/01/2014, 10:38 AM

## 2014-02-01 NOTE — [facility] Group Notes (Signed)
[facility] Note   02/01/2014  8:45 AM  Participation Quality:  Did Not Attend - pt meeting with MD during group  Eretria Manternach Horton, LCSW 02/01/2014 9:34 AM

## 2014-02-01 NOTE — Progress Notes (Signed)
D: Patient's affect appropriate to circumstance and mood is anxious. He reported on the self inventory sheet that he's sleeping well, appetite and ability to pay attention are both good, and energy level is normal. Patient rates depression "2" and feelings of hopelessness "1". He's actively participating in groups and interacting with peers in the dayroom. Patient adheres to medication regimen. He is to discharge this evening.  A: Support and encouragement provided to patient. Administered scheduled medications per ordering MD. Monitor Q15 minute checks for safety.   R: Patient receptive. Denies SI/HI/AVH. Patient remains safe on the unit.

## 2014-02-01 NOTE — Progress Notes (Signed)
Pt discharged home with wife.  Pt received all belongings and prescriptions as well as discharge papers and instructions.  Pt denies being suicidal in any way.  Pt expressed hope for future, and was pleased to be going home.  Pt had lab draw before discharge.  Pt voiced no questions or concerns.  He did not have a locker and he collected all belongings from his room.

## 2014-02-01 NOTE — [facility] Suicide Risk Assessment (Signed)
Suicide Risk Assessment  Discharge Assessment     Demographic Factors:  Male and Caucasian  Total Time spent with patient: 45 minutes  Psychiatric Specialty Exam:     Blood pressure 151/92, pulse 67, temperature 98.1 F (36.7 C), temperature source Oral, resp. rate 18, height 6' (1.829 m).There is no weight on file to calculate BMI.  General Appearance: Fairly Groomed  Patent attorneyye Contact::  Fair  Speech:  Clear and Coherent  Volume:  Normal  Mood:  worried  Affect:  Appropriate  Thought Process:  Coherent and Goal Directed  Orientation:  Full (Time, Place, and Person)  Thought Content:  relapse prevention plan  Suicidal Thoughts:  No  Homicidal Thoughts:  No  Memory:  Immediate;   Fair Recent;   Fair Remote;   Fair  Judgement:  Fair  Insight:  Present  Psychomotor Activity:  Normal  Concentration:  Fair  Recall:  [employer]  Fund of Knowledge:NA  Language: Fair  Akathisia:  No  Handed:    AIMS (if indicated):     Assets:  Desire for Improvement Housing Transportation Vocational/Educational  Sleep:  Number of Hours: 5.5    Musculoskeletal: Strength & Muscle Tone: within normal limits Gait & Station: normal Patient leans: N/A   Mental Status Per Nursing Assessment::   On Admission:     Current Mental Status by Physician: In full contact with reality. There are no active S/S of withdrawal. There are no active SI plans or intent. He is invested in continuing to work on accomplishing long term abstinence and euthymia   Loss Factors: NA  Historical Factors: NA  Risk Reduction Factors:   Responsible for children under 30 years of age, Sense of responsibility to family, Employed and Living with another person, especially a relative  Continued Clinical Symptoms:  Bipolar Disorder:   Bipolar II Alcohol/Substance Abuse/Dependencies  Cognitive Features That Contribute To Risk:  Polarized thinking Thought constriction (tunnel vision)    Suicide Risk:  Minimal: No  identifiable suicidal ideation.  Patients presenting with no risk factors but with morbid ruminations; may be classified as minimal risk based on the severity of the depressive symptoms  Discharge Diagnoses:   AXIS I:  Alcohol Dependence,  Mood Disorder NOS R/O Bipolar II AXIS II:  Deferred AXIS III:   Past Medical History  Diagnosis Date  . Depression   . Obsessive compulsive disorder   . Anxiety   . Mood changes   . Anger    AXIS IV:  other psychosocial or environmental problems AXIS V:  61-70 mild symptoms  Plan Of Care/Follow-up recommendations:  Activity:  as tolerated Diet:  regular Follow up outpatient basis/PCP for HBP Is patient on multiple antipsychotic therapies at discharge:  No   Has Patient had three or more failed trials of antipsychotic monotherapy by history:  No  Recommended Plan for Multiple Antipsychotic Therapies: NA    Clinton FeeIrving A Kiyo Taylor 02/01/2014, 12:14 PM

## 2014-02-01 NOTE — Progress Notes (Signed)
D. Pt has been up and has been visible in milieu this evening, attending and participating in various milieu activities. Pt spoke about how he is feeling a bit better today, spoke briefly about addiction issues and anger issues and the need to work on those issues. Pt also spoke about having poor sleep and hopes an increase in medications will help him rest better. Pt does endorse anxiety but does not display any overt signs or symptoms of withdrawal. A. Support and encouragement provided, medication education given. R. Pt verbalized understanding, safety maintained.

## 2014-02-01 NOTE — Tx Team (Signed)
Interdisciplinary Treatment Plan Update (Adult)  Date: 02/01/2014  Time Reviewed:  9:45 AM  Progress in Treatment: Attending groups: Yes Participating in groups:  Yes Taking medication as prescribed:  Yes Tolerating medication:  Yes Family/Significant othe contact made: Yes, with pt's wife Patient understands diagnosis:  Yes Discussing patient identified problems/goals with staff:  Yes Medical problems stabilized or resolved:  Yes Denies suicidal/homicidal ideation: Yes Issues/concerns per patient self-inventory:  Yes Other:  New problem(s) identified: N/A  Discharge Plan or Barriers: [facility] [place] for medication management and therapy.    Reason for Continuation of Hospitalization: Stable to d/c today  Comments: N/A  Estimated length of stay: D/C today  For review of initial/current patient goals, please see plan of care.  Attendees: Patient:     Family:     Physician:  Dr. Dub MikesLugo 02/01/2014 10:35 AM   Nursing:   Carroll SageBrooks Nichols, RN 02/01/2014 10:35 AM   Clinical Social Worker:  Reyes Ivanhelsea Horton, LCSW 02/01/2014 10:35 AM   Other: Onnie BoerJennifer Clark, RN case manager 02/01/2014 10:35 AM   Other:     Other:     Other:     Other:    Other:    Other:    Other:    Other:    Other:     Scribe for Treatment Team:   Carmina MillerHorton, Jamear Carbonneau Nicole, 02/01/2014 , 10:35 AM

## 2014-02-02 LAB — TESTOSTERONE: Testosterone: 162 ng/dL — ABNORMAL LOW (ref 300–890)

## 2014-02-04 LAB — SEX HORMONE BINDING GLOBULIN: Sex Hormone Binding: 7 nmol/L — ABNORMAL LOW (ref 13–71)

## 2014-02-04 LAB — TESTOSTERONE, % FREE: TESTOSTERONE-% FREE: 3.4 % — AB (ref 1.6–2.9)

## 2014-02-04 LAB — TESTOSTERONE, FREE: Testosterone, Free: 55.2 pg/mL (ref 47.0–244.0)

## 2014-02-06 NOTE — Progress Notes (Signed)
Patient Discharge Instructions:  Next Level Care Provider Has Access to the EMR, 02/06/14 [facility] via [facility]/Epic access.  Jerelene ReddenSheena E [place], 02/06/2014, 2:10 PM

## 2014-02-11 ENCOUNTER — Ambulatory Visit (INDEPENDENT_AMBULATORY_CARE_PROVIDER_SITE_OTHER): Payer: PRIVATE HEALTH INSURANCE | Admitting: Psychiatry

## 2014-02-11 ENCOUNTER — Encounter (HOSPITAL_COMMUNITY): Payer: Self-pay | Admitting: Psychiatry

## 2014-02-11 VITALS — BP 180/120 | Ht 72.0 in | Wt 273.0 lb

## 2014-02-11 DIAGNOSIS — F1010 Alcohol abuse, uncomplicated: Secondary | ICD-10-CM

## 2014-02-11 DIAGNOSIS — F3160 Bipolar disorder, current episode mixed, unspecified: Secondary | ICD-10-CM

## 2014-02-11 DIAGNOSIS — F339 Major depressive disorder, recurrent, unspecified: Secondary | ICD-10-CM

## 2014-02-11 DIAGNOSIS — F429 Obsessive-compulsive disorder, unspecified: Secondary | ICD-10-CM

## 2014-02-11 MED ORDER — LAMOTRIGINE 25 MG PO TABS: 50.0000 mg | ORAL_TABLET | Freq: Two times a day (BID) | ORAL | Status: DC

## 2014-02-11 MED ORDER — TRAZODONE HCL 100 MG PO TABS: 100.0000 mg | ORAL_TABLET | Freq: Every evening | ORAL | Status: DC | PRN

## 2014-02-11 MED ORDER — CITALOPRAM HYDROBROMIDE 20 MG PO TABS: 20.0000 mg | ORAL_TABLET | Freq: Every day | ORAL | Status: DC

## 2014-02-11 MED ORDER — HYDROXYZINE HCL 25 MG PO TABS: ORAL_TABLET | ORAL | Status: DC
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
## 2014-02-26 ENCOUNTER — Ambulatory Visit (HOSPITAL_COMMUNITY): Payer: Self-pay | Admitting: Psychiatry

## 2014-03-08 ENCOUNTER — Ambulatory Visit (INDEPENDENT_AMBULATORY_CARE_PROVIDER_SITE_OTHER): Payer: PRIVATE HEALTH INSURANCE | Admitting: Psychiatry

## 2014-03-08 ENCOUNTER — Encounter (HOSPITAL_COMMUNITY): Payer: Self-pay | Admitting: Psychiatry

## 2014-03-08 VITALS — BP 180/100 | Ht 72.0 in | Wt 273.0 lb

## 2014-03-08 DIAGNOSIS — F1020 Alcohol dependence, uncomplicated: Secondary | ICD-10-CM

## 2014-03-08 DIAGNOSIS — F339 Major depressive disorder, recurrent, unspecified: Secondary | ICD-10-CM

## 2014-03-08 DIAGNOSIS — F3160 Bipolar disorder, current episode mixed, unspecified: Secondary | ICD-10-CM

## 2014-03-08 DIAGNOSIS — F1010 Alcohol abuse, uncomplicated: Secondary | ICD-10-CM

## 2014-03-08 DIAGNOSIS — F429 Obsessive-compulsive disorder, unspecified: Secondary | ICD-10-CM

## 2014-03-08 MED ORDER — HYDROXYZINE HCL 25 MG PO TABS: ORAL_TABLET | ORAL | Status: DC

## 2014-03-08 MED ORDER — CITALOPRAM HYDROBROMIDE 40 MG PO TABS: 40.0000 mg | ORAL_TABLET | Freq: Every day | ORAL | Status: DC

## 2014-03-08 MED ORDER — LAMOTRIGINE 25 MG PO TABS: 50.0000 mg | ORAL_TABLET | Freq: Two times a day (BID) | ORAL | Status: DC

## 2014-03-08 MED ORDER — TRAZODONE HCL 100 MG PO TABS: 100.0000 mg | ORAL_TABLET | Freq: Every evening | ORAL | Status: DC | PRN
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
## 2014-03-12 ENCOUNTER — Ambulatory Visit (HOSPITAL_COMMUNITY): Payer: Self-pay | Admitting: Psychiatry

## 2014-04-18 ENCOUNTER — Encounter (HOSPITAL_COMMUNITY): Payer: Self-pay | Admitting: Psychiatry

## 2014-04-18 ENCOUNTER — Ambulatory Visit (HOSPITAL_COMMUNITY): Payer: Self-pay | Admitting: Psychiatry

## 2018-01-04 ENCOUNTER — Ambulatory Visit: Payer: No Typology Code available for payment source | Admitting: Physician Assistant

## 2018-01-04 ENCOUNTER — Encounter: Payer: Self-pay | Admitting: Physician Assistant

## 2018-01-04 ENCOUNTER — Other Ambulatory Visit: Payer: Self-pay

## 2018-01-04 VITALS — BP 119/76 | HR 89 | Temp 98.7°F | Resp 18 | Ht 72.0 in | Wt 352.6 lb

## 2018-01-04 DIAGNOSIS — F39 Unspecified mood [affective] disorder: Secondary | ICD-10-CM

## 2018-01-04 DIAGNOSIS — R0683 Snoring: Secondary | ICD-10-CM | POA: Diagnosis not present

## 2018-01-04 DIAGNOSIS — I10 Essential (primary) hypertension: Secondary | ICD-10-CM

## 2018-01-04 DIAGNOSIS — Z6841 Body Mass Index (BMI) 40.0 and over, adult: Secondary | ICD-10-CM

## 2018-01-04 DIAGNOSIS — R7303 Prediabetes: Secondary | ICD-10-CM

## 2018-01-04 MED ORDER — AMLODIPINE BESYLATE 5 MG PO TABS: 5.0000 mg | ORAL_TABLET | Freq: Every day | ORAL | 3 refills | Status: DC

## 2018-01-04 MED ORDER — LISINOPRIL-HYDROCHLOROTHIAZIDE 20-12.5 MG PO TABS: 2.0000 | ORAL_TABLET | Freq: Every day | ORAL | 3 refills | Status: DC

## 2018-01-04 MED ORDER — CITALOPRAM HYDROBROMIDE 40 MG PO TABS: 20.0000 mg | ORAL_TABLET | Freq: Every day | ORAL | 3 refills | Status: DC

## 2018-01-04 NOTE — Patient Instructions (Addendum)
  Please go to the sleep neurologist.  I will see you back in 6 weeks. It was great to meet you today.     IF you received an x-ray today, you will receive an invoice from [facility]. Please contact [facility] at [phone] with questions or concerns regarding your invoice.   IF you received labwork today, you will receive an invoice from [facility]. Please contact [facility] at [phone] with questions or concerns regarding your invoice.   Our billing staff will not be able to assist you with questions regarding bills from these companies.  You will be contacted with the lab results as soon as they are available. The fastest way to get your results is to activate your My Chart account. Instructions are located on the last page of this paperwork. If you have not heard from us regarding the results in 2 weeks, please contact this office.

## 2018-01-04 NOTE — Progress Notes (Signed)
01/05/2018 12:13 PM   DOB: 03/05/1986 / MRN: [id number]  SUBJECTIVE:  Clinton Taylor is a 32 y.o. male presenting to establish care. He is recently moved back to [place].He has a history of hypertension, somewhat recalcitrant to medical therapy.  He tells me that he snores loudly and cannot sleep in the same room with his wife.  Several of his immediate family members have sleep apnea.He is willing to go for a sleep study evaluation.He is taking clonidine for the treatment of hypertension and says that this makes him feel tired almost immediately.  He is taking 5 mg of Norvasc along with 40 lisinopril and 12.5 twice daily of hydrochlorothiazide.  Tells me he has a history of OCD and states that this mostly involves worrying excessively.  He notes that he has made some progress in the past with regard to trying to decrease his worry over things that he cannot control.  Tells me that he has been taking 20 mg of Celexa now for a few months which is down from 40 mg and he feels that this dose helps him enough.  He like to continue taking one half tab at this time.   He has No Known Allergies.   He  has a past medical history of Anger, Anxiety, Depression, Hiatal hernia, Hypertension, Mood changes, and Obsessive compulsive disorder.    He  reports that he has quit smoking. His smoking use included cigarettes. He smoked 1.00 pack per day. He has quit using smokeless tobacco. His smokeless tobacco use included chew. He reports that he does not drink alcohol or use drugs. He  reports that he currently engages in sexual activity. The patient  has no past surgical history on file.  His family history includes Alcohol abuse in his father, maternal uncle, paternal grandmother, and paternal uncle; Bipolar disorder in his father; Drug abuse in his father and paternal grandmother; OCD in his maternal grandfather and mother.  Review of Systems  Constitutional: Negative for chills, diaphoresis and fever.    Eyes: Negative.   Respiratory: Negative for cough, hemoptysis, sputum production, shortness of breath and wheezing.   Cardiovascular: Negative for chest pain, orthopnea and leg swelling.  Gastrointestinal: Negative for nausea.  Skin: Negative for rash.  Neurological: Negative for dizziness, sensory change, speech change, focal weakness and headaches.    The problem list and medications were reviewed and updated by myself where necessary and exist elsewhere in the encounter.   OBJECTIVE:  BP 119/76 (BP Location: Left Arm, Patient Position: Sitting, Cuff Size: Large)   Pulse 89   Temp 98.7 F (37.1 C) (Oral)   Resp 18   Ht 6' (1.829 m)   Wt (!) 352 lb 9.6 oz (159.9 kg)   SpO2 97%   BMI 47.82 kg/m    Physical Exam  Constitutional: He appears well-developed. He is active and cooperative.  Non-toxic appearance.  Cardiovascular: Normal rate, regular rhythm, S1 normal, S2 normal, normal heart sounds, intact distal pulses and normal pulses. Exam reveals no gallop and no friction rub.  No murmur heard. Pulmonary/Chest: Effort normal. No stridor. No tachypnea. No respiratory distress. He has no wheezes. He has no rales.  Abdominal: He exhibits no distension.  Musculoskeletal: He exhibits no edema.  Neurological: He is alert.  Skin: Skin is warm and dry. He is not diaphoretic. No pallor.  Vitals reviewed.   Results for orders placed or performed in visit on 01/04/18 (from the past 72 hour(s))  CBC  Status: Abnormal   Collection Time: 01/04/18  5:49 PM  Result Value Ref Range   WBC 12.7 (H) 3.4 - 10.8 x10E3/uL   RBC 5.43 4.14 - 5.80 x10E6/uL   Hemoglobin 15.0 13.0 - 17.7 g/dL   Hematocrit 91.445.4 78.237.5 - 51.0 %   MCV 84 79 - 97 fL   MCH 27.6 26.6 - 33.0 pg   MCHC 33.0 31.5 - 35.7 g/dL   RDW 95.615.1 21.312.3 - 08.615.4 %   Platelets 329 150 - 379 x10E3/uL  Lipid panel     Status: Abnormal   Collection Time: 01/04/18  5:49 PM  Result Value Ref Range   Cholesterol, Total 213 (H) 100 - 199  mg/dL   Triglycerides 578255 (H) 0 - 149 mg/dL   HDL 26 (L) >46>39 mg/dL   VLDL Cholesterol Cal 51 (H) 5 - 40 mg/dL   LDL Calculated 962136 (H) 0 - 99 mg/dL   Chol/HDL Ratio 8.2 (H) 0.0 - 5.0 ratio    Comment:                                   T. Chol/HDL Ratio                                             Men  Women                               1/2 Avg.Risk  3.4    3.3                                   Avg.Risk  5.0    4.4                                2X Avg.Risk  9.6    7.1                                3X Avg.Risk 23.4   11.0   TSH     Status: None   Collection Time: 01/04/18  5:49 PM  Result Value Ref Range   TSH 1.760 0.450 - 4.500 uIU/mL  Hemoglobin A1c     Status: Abnormal   Collection Time: 01/04/18  5:49 PM  Result Value Ref Range   Hgb A1c MFr Bld 5.8 (H) 4.8 - 5.6 %    Comment:          Prediabetes: 5.7 - 6.4          Diabetes: >6.4          Glycemic control for adults with diabetes: <7.0    Est. average glucose Bld gHb Est-mCnc 120 mg/dL  Basic metabolic panel     Status: None   Collection Time: 01/04/18  5:49 PM  Result Value Ref Range   Glucose 83 65 - 99 mg/dL   BUN 14 6 - 20 mg/dL   Creatinine, Ser 9.520.94 0.76 - 1.27 mg/dL   GFR calc non Af Amer 106 >59 mL/min/1.73   GFR calc Af Amer 123 >59 mL/min/1.73   BUN/Creatinine  Ratio 15 9 - 20   Sodium 139 134 - 144 mmol/L   Potassium 4.7 3.5 - 5.2 mmol/L   Chloride 99 96 - 106 mmol/L   CO2 23 20 - 29 mmol/L   Calcium 9.8 8.7 - 10.2 mg/dL  Hepatic function panel     Status: None   Collection Time: 01/04/18  5:49 PM  Result Value Ref Range   Total Protein 7.0 6.0 - 8.5 g/dL   Albumin 4.3 3.5 - 5.5 g/dL   Bilirubin Total 0.2 0.0 - 1.2 mg/dL   Bilirubin, Direct 1.61 0.00 - 0.40 mg/dL   Alkaline Phosphatase 94 39 - 117 IU/L   AST 19 0 - 40 IU/L   ALT 27 0 - 44 IU/L    No results found.  ASSESSMENT AND PLAN:  Clinton Taylor was seen today for establish care and medication management.  Diagnoses and all orders for this  visit:  Snoring -     Ambulatory referral to Sleep Studies  Hypertension, unspecified type: I would like to see him off of his clonidine.  I feel almost certain that he has some form hernia.  I will hold off on any significant medication changes until this is further investigated.  I tried to simplify his regimen by combining his lisinopril hydrochlorothiazide.  If his blood pressure remains difficult to control after his sleep apnea evaluation I will likely DC the clonidine and push the Norvasc up to 10 mg daily.I will see him back in about 6 weeks for recheck. -     CBC -     Lipid panel -     TSH -     Hemoglobin A1c -     Basic metabolic panel -     Hepatic function panel -     lisinopril-hydrochlorothiazide (ZESTORETIC) 20-12.5 MG tablet; Take 2 tablets by mouth daily. -     amLODipine (NORVASC) 5 MG tablet; Take 1 tablet (5 mg total) by mouth daily.  Mood disorder (HCC): Well-controlled.  We will maintain the current plan. -     citalopram (CELEXA) 40 MG tablet; Take 0.5 tablets (20 mg total) by mouth daily.    The patient is advised to call or return to clinic if he does not see an improvement in symptoms, or to seek the care of the closest emergency department if he worsens with the above plan.   Deliah Boston, MHS, PA-C [facility] 01/05/2018 12:13 PM

## 2018-01-05 DIAGNOSIS — R0683 Snoring: Secondary | ICD-10-CM | POA: Insufficient documentation

## 2018-01-05 DIAGNOSIS — I10 Essential (primary) hypertension: Secondary | ICD-10-CM | POA: Insufficient documentation

## 2018-01-05 DIAGNOSIS — R7303 Prediabetes: Secondary | ICD-10-CM | POA: Insufficient documentation

## 2018-01-05 DIAGNOSIS — Z6841 Body Mass Index (BMI) 40.0 and over, adult: Secondary | ICD-10-CM

## 2018-01-05 DIAGNOSIS — F39 Unspecified mood [affective] disorder: Secondary | ICD-10-CM | POA: Insufficient documentation

## 2018-01-05 LAB — HEPATIC FUNCTION PANEL
ALBUMIN: 4.3 g/dL (ref 3.5–5.5)
ALT: 27 IU/L (ref 0–44)
AST: 19 IU/L (ref 0–40)
Alkaline Phosphatase: 94 IU/L (ref 39–117)
Bilirubin Total: 0.2 mg/dL (ref 0.0–1.2)
Bilirubin, Direct: 0.08 mg/dL (ref 0.00–0.40)
Total Protein: 7 g/dL (ref 6.0–8.5)

## 2018-01-05 LAB — HEMOGLOBIN A1C
Est. average glucose Bld gHb Est-mCnc: 120 mg/dL
HEMOGLOBIN A1C: 5.8 % — AB (ref 4.8–5.6)

## 2018-01-05 LAB — BASIC METABOLIC PANEL
BUN/Creatinine Ratio: 15 (ref 9–20)
BUN: 14 mg/dL (ref 6–20)
CO2: 23 mmol/L (ref 20–29)
Calcium: 9.8 mg/dL (ref 8.7–10.2)
Chloride: 99 mmol/L (ref 96–106)
Creatinine, Ser: 0.94 mg/dL (ref 0.76–1.27)
GFR calc Af Amer: 123 mL/min/{1.73_m2} (ref >59)
GFR, EST NON AFRICAN AMERICAN: 106 mL/min/{1.73_m2} (ref >59)
GLUCOSE: 83 mg/dL (ref 65–99)
Potassium: 4.7 mmol/L (ref 3.5–5.2)
SODIUM: 139 mmol/L (ref 134–144)

## 2018-01-05 LAB — TSH: TSH: 1.76 u[IU]/mL (ref 0.450–4.500)

## 2018-01-05 LAB — CBC
Hematocrit: 45.4 % (ref 37.5–51.0)
Hemoglobin: 15 g/dL (ref 13.0–17.7)
MCH: 27.6 pg (ref 26.6–33.0)
MCHC: 33 g/dL (ref 31.5–35.7)
MCV: 84 fL (ref 79–97)
PLATELETS: 329 10*3/uL (ref 150–379)
RBC: 5.43 x10E6/uL (ref 4.14–5.80)
RDW: 15.1 % (ref 12.3–15.4)
WBC: 12.7 10*3/uL — ABNORMAL HIGH (ref 3.4–10.8)

## 2018-01-05 LAB — LIPID PANEL
Chol/HDL Ratio: 8.2 ratio — ABNORMAL HIGH (ref 0.0–5.0)
Cholesterol, Total: 213 mg/dL — ABNORMAL HIGH (ref 100–199)
HDL: 26 mg/dL — ABNORMAL LOW (ref >39)
LDL Calculated: 136 mg/dL — ABNORMAL HIGH (ref 0–99)
Triglycerides: 255 mg/dL — ABNORMAL HIGH (ref 0–149)
VLDL CHOLESTEROL CAL: 51 mg/dL — AB (ref 5–40)

## 2018-03-22 ENCOUNTER — Institutional Professional Consult (permissible substitution): Payer: No Typology Code available for payment source | Admitting: Neurology

## 2018-07-19 ENCOUNTER — Encounter: Payer: Self-pay | Admitting: Neurology

## 2018-07-19 ENCOUNTER — Ambulatory Visit (INDEPENDENT_AMBULATORY_CARE_PROVIDER_SITE_OTHER): Payer: BLUE CROSS/BLUE SHIELD | Admitting: Neurology

## 2018-07-19 ENCOUNTER — Encounter

## 2018-07-19 VITALS — BP 168/104 | HR 88 | Ht 72.0 in | Wt 351.0 lb

## 2018-07-19 DIAGNOSIS — R51 Headache: Secondary | ICD-10-CM | POA: Diagnosis not present

## 2018-07-19 DIAGNOSIS — R351 Nocturia: Secondary | ICD-10-CM | POA: Diagnosis not present

## 2018-07-19 DIAGNOSIS — G4719 Other hypersomnia: Secondary | ICD-10-CM

## 2018-07-19 DIAGNOSIS — R519 Headache, unspecified: Secondary | ICD-10-CM

## 2018-07-19 DIAGNOSIS — Z820 Family history of epilepsy and other diseases of the nervous system: Secondary | ICD-10-CM | POA: Diagnosis not present

## 2018-07-19 DIAGNOSIS — F17200 Nicotine dependence, unspecified, uncomplicated: Secondary | ICD-10-CM

## 2018-07-19 DIAGNOSIS — R0681 Apnea, not elsewhere classified: Secondary | ICD-10-CM

## 2018-07-19 DIAGNOSIS — Z9189 Other specified personal risk factors, not elsewhere classified: Secondary | ICD-10-CM

## 2018-07-19 DIAGNOSIS — Z6841 Body Mass Index (BMI) 40.0 and over, adult: Secondary | ICD-10-CM

## 2018-07-19 NOTE — Progress Notes (Signed)
Subjective:    Patient ID: Clinton Taylor is a 32 y.o. male.  HPI    Clinton Foley, MD, PhD [facility] [address] [address] [place], [place] [address]  Dear Clinton Taylor,   I saw your patient, Clinton Taylor, upon your kind request, in my sleep clinic today for initial consultation of his sleep disorder, in particular, concern for underlying obstructive sleep apnea. The patient is unaccompanied today. As you know, Clinton Taylor is a 32 year old right-handed gentleman with an underlying medical history of mood disorder including bipolar diagnosis, OCD, and anxiety, hypertension, hiatal hernia, and morbid obesity with BMI of over 45, who reports snoring and excessive daytime somnolence as well as a family history of OSA. I reviewed your office note from 01/04/2018. His mother and younger brother have sleep apnea, both used CPAP machines. He would be willing to consider CPAP therapy. He would favor a nasal interface and not a full facemask. He lives at home with his family including wife and 3 kids, ages 54, 20, and 13. He is trying to quit smoking and trying to work on weight loss. He has started exercising in the form of running. He has reduced his smoking to half a pack per day. He drinks caffeine in the form of coffee, one cup in the morning and Clinton Taylor, 2-3 cans per day on average. He has been trying to reduce his soda intake. He works as a Ambulance person. He has been witnessed to have breathing pauses while asleep. He has nocturia about once or twice per average night and has had occasional morning headaches, worse before he was on blood pressure medicine.  His Past Medical History Is Significant For: Past Medical History:  Diagnosis Date  . Anger   . Anxiety   . Depression   . Hiatal hernia   . Hypertension   . Mood changes   . Obsessive compulsive disorder     His Past Surgical History Is Significant For: No past surgical history on  file.  His Family History Is Significant For: Family History  Problem Relation Age of Onset  . OCD Mother   . Alcohol abuse Father   . Bipolar disorder Father   . Drug abuse Father   . Alcohol abuse Maternal Uncle   . Alcohol abuse Paternal Uncle   . OCD Maternal Grandfather   . Alcohol abuse Paternal Grandmother   . Drug abuse Paternal Grandmother     His Social History Is Significant For: Social History   Socioeconomic History  . Marital status: Married    Spouse name: Not on file  . Number of children: Not on file  . Years of education: Not on file  . Highest education level: Not on file  Occupational History  . Not on file  Social Needs  . Financial resource strain: Not on file  . Food insecurity:    Worry: Not on file    Inability: Not on file  . Transportation needs:    Medical: Not on file    Non-medical: Not on file  Tobacco Use  . Smoking status: Former Smoker    Packs/day: 1.00    Types: Cigarettes  . Smokeless tobacco: Former Neurosurgeon    Types: Chew  Substance and Sexual Activity  . Alcohol use: No    Comment: socially  . Drug use: No    Types: Marijuana  . Sexual activity: Yes  Lifestyle  . Physical activity:    Days per week: Not  on file    Minutes per session: Not on file  . Stress: Not on file  Relationships  . Social connections:    Talks on phone: Not on file    Gets together: Not on file    Attends religious service: Not on file    Active member of club or organization: Not on file    Attends meetings of clubs or organizations: Not on file    Relationship status: Not on file  Other Topics Concern  . Not on file  Social History Narrative  . Not on file    His Allergies Are:  No Known Allergies:   His Current Medications Are:  Outpatient Encounter Medications as of 07/19/2018  Medication Sig  . amLODipine (NORVASC) 5 MG tablet Take 1 tablet (5 mg total) by mouth daily.  . citalopram (CELEXA) 40 MG tablet Take 0.5 tablets (20 mg  total) by mouth daily.  . cloNIDine (CATAPRES) 0.2 MG tablet Take 1 tablet (0.2 mg total) by mouth daily. For hypertension  . lisinopril-hydrochlorothiazide (ZESTORETIC) 20-12.5 MG tablet Take 2 tablets by mouth daily.  Marland Kitchen triamcinolone cream (KENALOG) 0.1 % Apply topically 2 (two) times daily. For skin rashes   No facility-administered encounter medications on file as of 07/19/2018.   :  Review of Systems:  Out of a complete 14 point review of systems, all are reviewed and negative with the exception of these symptoms as listed below:  Review of Systems  Neurological:       Pt presents today to discuss his sleep. Pt has never had a sleep study but does endorse snoring.  Epworth Sleepiness Scale 0= would never doze 1= slight chance of dozing 2= moderate chance of dozing 3= high chance of dozing  Sitting and reading: 0 Watching TV: 2 Sitting inactive in a public place (ex. Theater or meeting): 1 As a passenger in a car for an hour without a break: 2 Lying down to rest in the afternoon: 2 Sitting and talking to someone: 0 Sitting quietly after lunch (no alcohol): 1 In a car, while stopped in traffic: 0 Total: 8     Objective:  Neurological Exam  Physical Exam Physical Examination:   Vitals:   07/19/18 1108  BP: (!) 184/114  Pulse: 88    General Examination: The patient is a very pleasant 32 y.o. male in no acute distress. He appears well-developed and well-nourished and well groomed.   HEENT: Normocephalic, atraumatic, pupils are equal, round and reactive to light and accommodation. Extraocular tracking is good without limitation to gaze excursion or nystagmus noted. Normal smooth pursuit is noted. Hearing is grossly intact. Face is symmetric with normal facial animation and normal facial sensation. Speech is clear with no dysarthria noted. There is no hypophonia. There is no lip, neck/head, jaw or voice tremor. Neck is supple with full range of passive and active motion.  There are no carotid bruits on auscultation. Oropharynx exam reveals:  mouth dryness, adequate dental hygiene and moderate airway crowding, due to smaller airway and tonsils of 2+. Mallampati is class II. Tongue protrudes centrally and palate elevates symmetrically. Neck size is 19.75 inches.  Chest: Clear to auscultation without wheezing, rhonchi or crackles noted.  Heart: S1+S2+0, regular and normal without murmurs, rubs or gallops noted.   Abdomen: Soft, non-tender and non-distended with normal bowel sounds appreciated on auscultation.  Extremities: There is no pitting edema in the distal lower extremities bilaterally.  Skin: Warm and dry with patchy hypopigmented areas in the  distal lower extremities and upper shoulder areas with some hypopigmented areas. He has a history of psoriasis.  Musculoskeletal: exam reveals no obvious joint deformities, tenderness or joint swelling or erythema.   Neurologically:  Mental status: The patient is awake, alert and oriented in all 4 spheres. His immediate and remote memory, attention, language skills and fund of knowledge are appropriate. There is no evidence of aphasia, agnosia, apraxia or anomia. Speech is clear with normal prosody and enunciation. Thought process is linear. Mood is normal and affect is normal.  Cranial nerves II - XII are as described above under HEENT exam. In addition: shoulder shrug is normal with equal shoulder height noted. Motor exam: Normal bulk, strength and tone is noted. There is no drift, tremor or rebound. Reflexes are 1+ throughout. Fine motor skills and coordination: intact with normal finger taps, normal hand movements, normal rapid alternating patting, normal foot taps and normal foot agility.  Cerebellar testing: No dysmetria or intention tremor on finger to nose testing. Heel to shin is unremarkable bilaterally. There is no truncal or gait ataxia.  Sensory exam: intact to light touch in the upper and lower extremities.   Gait, station and balance: He stands easily. No veering to one side is noted. No leaning to one side is noted. Posture is age-appropriate and stance is narrow based. Gait shows normal stride length and pace, normal turns.               Assessment and Plan:   In summary, Clinton Taylor is a very pleasant 32 y.o.-year old male  with an underlying medical history of mood disorder including bipolar diagnosis, OCD, and anxiety, hypertension, hiatal hernia, and morbid obesity with BMI of over 45, whose history and physical exam are concerning for obstructive sleep apnea (OSA). I had a long chat with the patient about my findings and the diagnosis of OSA, its prognosis and treatment options. We talked about medical treatments, surgical interventions and non-pharmacological approaches. I explained in particular the risks and ramifications of untreated moderate to severe OSA, especially with respect to developing cardiovascular disease down the Road, including congestive heart failure, difficult to treat hypertension, cardiac arrhythmias, or stroke. Even type 2 diabetes has, in part, been linked to untreated OSA. Symptoms of untreated OSA include daytime sleepiness, memory problems, mood irritability and mood disorder such as depression and anxiety, lack of energy, as well as recurrent headaches, especially morning headaches. We talked about smoking cessation and trying to maintain a healthy lifestyle in general, as well as the importance of weight control. I encouraged the patient to eat healthy, exercise daily and keep well hydrated, to keep a scheduled bedtime and wake time routine, to not skip any meals and eat healthy snacks in between meals. I advised the patient not to drive when feeling sleepy. I recommended the following at this time: sleep study with potential positive airway pressure titration. (We will score hypopneas at 3%).   I explained the sleep test procedure to the patient and also outlined  possible surgical and non-surgical treatment options of OSA, including the use of a custom-made dental device (which would require a referral to a specialist dentist or oral surgeon), upper airway surgical options, such as pillar implants, radiofrequency surgery, tongue base surgery, and UPPP (which would involve a referral to an ENT surgeon). Rarely, jaw surgery such as mandibular advancement may be considered.  I also explained the CPAP treatment option to the patient, who indicated that he would be willing to try  CPAP if the need arises. I explained the importance of being compliant with PAP treatment, not only for insurance purposes but primarily to improve His symptoms, and for the patient's long term health benefit, including to reduce His cardiovascular risks. I answered all his questions today and the patient was in agreement. I plan to see him back after the sleep study is completed and encouraged him to call with any interim questions, concerns, problems or updates.   Thank you very much for allowing me to participate in the care of this nice patient. If I can be of any further assistance to you please do not hesitate to call me at [phone].  Sincerely,   Star Age, MD, PhD

## 2018-07-19 NOTE — Patient Instructions (Signed)

## 2018-07-21 ENCOUNTER — Telehealth: Payer: Self-pay

## 2018-07-21 DIAGNOSIS — R0681 Apnea, not elsewhere classified: Secondary | ICD-10-CM

## 2018-07-21 NOTE — Telephone Encounter (Signed)
[employer] denied in lab sleep study, need HST order 

## 2018-07-24 NOTE — Telephone Encounter (Signed)
VO for HST from Dr. Athar received. HST order placed.  

## 2018-08-23 ENCOUNTER — Ambulatory Visit (INDEPENDENT_AMBULATORY_CARE_PROVIDER_SITE_OTHER): Payer: BLUE CROSS/BLUE SHIELD | Admitting: Neurology

## 2018-08-23 DIAGNOSIS — G4733 Obstructive sleep apnea (adult) (pediatric): Secondary | ICD-10-CM | POA: Diagnosis not present

## 2018-08-23 DIAGNOSIS — R0681 Apnea, not elsewhere classified: Secondary | ICD-10-CM

## 2018-08-28 ENCOUNTER — Telehealth: Payer: Self-pay

## 2018-08-28 NOTE — Procedures (Signed)
[facility] @[facility] [address] [place], [place] [address] NAME:  Clinton Taylor                                                     DOB: 1986-10-05 MEDICAL RECORD NUMBER [id number]                                         DOS: 08/23/18  REFERRING PHYSICIAN: Deliah Boston, PA-C STUDY PERFORMED: Home Sleep Test HISTORY: 32 year old man with a history of mood disorder including bipolar diagnosis, OCD, and anxiety, hypertension, hiatal hernia, and morbid obesity, who reports snoring and excessive daytime somnolence as well as a family history of OSA. BMI of 47.5.  STUDY RESULTS:  Total Recording Time:  9 hours, 1 minute (valid test time: 5 hours, 24 min) Total Apnea/Hypopnea Index (AHI): 35.8/h, RDI: 39.4/h Average Oxygen Saturation: 91 %, Lowest Oxygen Desaturation: 73 %  Total Time Oxygen Saturation Below or at 88 %: 112 minutes (22%)  Average Heart Rate:   77 bpm (between 54 and 108 bpm) IMPRESSION: OSA RECOMMENDATION: This home sleep test demonstrates severe obstructive sleep apnea with a total AHI of 35.8/hour and O2 nadir of 73%. Treatment with positive airway pressure (in the form of CPAP) is recommended. This will require a full night CPAP titration study for proper treatment settings, O2 monitoring and mask fitting. Based on the severity of the sleep disordered breathing an attended titration study is indicated. However, patient's insurance has denied an attended sleep study; therefore, the patient will be advised to proceed with an autoPAP titration/trial at home for now. He may need an attended PAP titration study down the road. Please note that untreated obstructive sleep apnea may carry additional perioperative morbidity. Patients with significant obstructive sleep apnea should receive perioperative PAP therapy and the surgeons and particularly the anesthesiologist should be informed of the diagnosis and the severity of the sleep disordered breathing. The patient  should be cautioned not to drive, work at heights, or operate dangerous or heavy equipment when tired or sleepy. Review and reiteration of good sleep hygiene measures should be pursued with any patient. Other causes of the patient's symptoms, including circadian rhythm disturbances, an underlying mood disorder, medication effect and/or an underlying medical problem cannot be ruled out based on this test. Clinical correlation is recommended. The patient and his referring provider will be notified of the test results. The patient will be seen in follow up in sleep clinic at [facility]. I certify that I have reviewed the raw data recording prior to the issuance of this report in accordance with the standards of the American Academy of Sleep Medicine (AASM).  Huston Foley, MD, PhD [facility]) Diplomat, ABPN (Neurology and Sleep)

## 2018-08-28 NOTE — Progress Notes (Signed)
Patient referred by Deliah Boston, PA, seen by me on 07/19/18, HST on 08/23/18.    Please call and notify the patient that the recent home sleep test showed obstructive sleep apnea in the severe range. While I recommend treatment for this in the form CPAP, his insurance will not approve a sleep study for this. They will likely only approve a trial of autoPAP, which means, that we don't have to bring him in for a sleep study with CPAP, but will let him try an autoPAP machine at home, through a DME company (of his choice, or as per insurance requirement). The DME representative will educate him on how to use the machine, how to put the mask on, etc. I have placed an order in the chart. Please send referral, talk to patient, send report to referring MD. We will need a FU in sleep clinic for 10 weeks post-PAP set up, please arrange that with me or one of our NPs. Thanks,   Huston Foley, MD, PhD [facility])

## 2018-08-28 NOTE — Telephone Encounter (Signed)
I called pt to discuss his sleep study results. No answer, left a VM asking him to call me back.

## 2018-08-28 NOTE — Telephone Encounter (Signed)
-----   Message from Huston Foley, MD sent at 08/28/2018  8:19 AM EST ----- Patient referred by Deliah Boston, PA, seen by me on 07/19/18, HST on 08/23/18.    Please call and notify the patient that the recent home sleep test showed obstructive sleep apnea in the severe range. While I recommend treatment for this in the form CPAP, his insurance will not approve a sleep study for this. They will likely only approve a trial of autoPAP, which means, that we don't have to bring him in for a sleep study with CPAP, but will let him try an autoPAP machine at home, through a DME company (of his choice, or as per insurance requirement). The DME representative will educate him on how to use the machine, how to put the mask on, etc. I have placed an order in the chart. Please send referral, talk to patient, send report to referring MD. We will need a FU in sleep clinic for 10 weeks post-PAP set up, please arrange that with me or one of our NPs. Thanks,   Huston Foley, MD, PhD [facility])

## 2018-08-28 NOTE — Addendum Note (Signed)
Addended by: Huston Foley on: 08/28/2018 08:19 AM   Modules accepted: Orders

## 2018-08-29 NOTE — Telephone Encounter (Signed)
I called pt again to discuss. No answer, left a message asking him to call me back. 

## 2018-08-29 NOTE — Telephone Encounter (Signed)
Pt returned my call. I advised pt that Dr. Frances FurbishAthar reviewed their sleep study results and found that pt has severe osa. Dr. Frances FurbishAthar recommends that pt start an auto pap at home since his insurance will likely deny an in lab sleep study. I reviewed PAP compliance expectations with the pt. Pt is agreeable to starting an auto-PAP. I advised pt that an order will be sent to a DME, [facility], and [facility] will call the pt within about one week after they file with the pt's insurance. [facility] will show the pt how to use the machine, fit for masks, and troubleshoot the auto-PAP if needed. A follow up appt was made for insurance purposes with Eber Jonesarolyn, NP on 12/06/18 at 8:15am. Pt verbalized understanding to arrive 15 minutes early and bring their auto-PAP. A letter with all of this information in it will be mailed to the pt as a reminder. I verified with the pt that the address we have on file is correct. Pt verbalized understanding of results. Pt had no questions at this time but was encouraged to call back if questions arise. I have sent the order to [facility] and have received confirmation that they have received the order.

## 2018-09-12 DIAGNOSIS — G4733 Obstructive sleep apnea (adult) (pediatric): Secondary | ICD-10-CM | POA: Diagnosis not present

## 2018-10-12 DIAGNOSIS — G4733 Obstructive sleep apnea (adult) (pediatric): Secondary | ICD-10-CM | POA: Diagnosis not present

## 2018-11-12 DIAGNOSIS — G4733 Obstructive sleep apnea (adult) (pediatric): Secondary | ICD-10-CM | POA: Diagnosis not present

## 2018-12-04 ENCOUNTER — Encounter: Payer: Self-pay | Admitting: Neurology

## 2018-12-05 NOTE — Progress Notes (Addendum)
[facility]  PATIENT: Clinton Taylor DOB: Mar 04, 1986   REASON FOR VISIT: Follow-up for newly diagnosed obstructive sleep apnea here for initial AutoPap HISTORY FROM: Patient alone at visit    HISTORY OF PRESENT ILLNESS:UPDATE 2/19/2020[name], 33 year old male returns for follow-up with a history of newly diagnosed obstructive sleep apnea here for initial AutoPap compliance.  He is having no problems with his machine.  He is using a full facemask.  He has a significant leak.  Excessive sleepiness has decreased.  Data dated 11/05/2018-12/04/2018 shows compliance greater than 4 hours at 97%.  Average usage 7 hours 1 minute pressure 10 to 20 cm.  EPR level 3 leak 95th percentile 45.2.  AHI 5.2 ESS 4.  He returns for reevaluation   10/2/19[name] is a 33 year old right-handed gentleman with an underlying medical history of mood disorder including bipolar diagnosis, OCD, and anxiety, hypertension, hiatal hernia, and morbid obesity with BMI of over 45, who reports snoring and excessive daytime somnolence as well as a family history of OSA. I reviewed your office note from 01/04/2018. His mother and younger brother have sleep apnea, both used CPAP machines. He would be willing to consider CPAP therapy. He would favor a nasal interface and not a full facemask. He lives at home with his family including wife and 3 kids, ages 7810, 599, and 738. He is trying to quit smoking and trying to work on weight loss. He has started exercising in the form of running. He has reduced his smoking to half a pack per day. He drinks caffeine in the form of coffee, one cup in the morning and [facility], 2-3 cans per day on average. He has been trying to reduce his soda intake. He works as a Ambulance personcontrols engineer. He has been witnessed to have breathing pauses while asleep. He has nocturia about once or twice per average night and has had occasional morning headaches, worse before he was on  blood pressure medicine.  REVIEW OF SYSTEMS: Full 14 system review of systems performed and notable only for those listed, all others are neg:  Constitutional: neg  Cardiovascular: neg Ear/Nose/Throat: neg  Skin: neg Eyes: neg Respiratory: neg Gastroitestinal: neg  Hematology/Lymphatic: neg  Endocrine: neg Musculoskeletal:neg Allergy/Immunology: neg Neurological: neg Psychiatric: History of bipolar depression Sleep : Obstructive sleep apnea with CPAP   ALLERGIES: No Known Allergies  HOME MEDICATIONS: Outpatient Medications Prior to Visit  Medication Sig Dispense Refill  . amLODipine (NORVASC) 5 MG tablet Take 1 tablet (5 mg total) by mouth daily. 90 tablet 3  . citalopram (CELEXA) 40 MG tablet Take 0.5 tablets (20 mg total) by mouth daily. 45 tablet 3  . lisinopril-hydrochlorothiazide (ZESTORETIC) 20-12.5 MG tablet Take 2 tablets by mouth daily. 180 tablet 3  . triamcinolone cream (KENALOG) 0.1 % Apply topically 2 (two) times daily. For skin rashes 30 g 0  . Varenicline Tartrate (CHANTIX STARTING MONTH PAK PO) Take by mouth.    . cloNIDine (CATAPRES) 0.2 MG tablet Take 1 tablet (0.2 mg total) by mouth daily. For hypertension (Patient not taking: Reported on 12/06/2018) 60 tablet 0   No facility-administered medications prior to visit.     PAST MEDICAL HISTORY: Past Medical History:  Diagnosis Date  . Anger   . Anxiety   . Depression   . Hiatal hernia   . Hypertension   . Mood changes   . Obsessive compulsive disorder     PAST SURGICAL HISTORY: History reviewed. No pertinent surgical history.  FAMILY  HISTORY: Family History  Problem Relation Age of Onset  . OCD Mother   . Alcohol abuse Father   . Bipolar disorder Father   . Drug abuse Father   . Alcohol abuse Maternal Uncle   . Alcohol abuse Paternal Uncle   . OCD Maternal Grandfather   . Alcohol abuse Paternal Grandmother   . Drug abuse Paternal Grandmother     SOCIAL HISTORY: Social History    Socioeconomic History  . Marital status: Married    Spouse name: Not on file  . Number of children: Not on file  . Years of education: Not on file  . Highest education level: Not on file  Occupational History  . Not on file  Social Needs  . Financial resource strain: Not on file  . Food insecurity:    Worry: Not on file    Inability: Not on file  . Transportation needs:    Medical: Not on file    Non-medical: Not on file  Tobacco Use  . Smoking status: Former Smoker    Packs/day: 1.00    Types: Cigarettes  . Smokeless tobacco: Former Neurosurgeon    Types: Chew  Substance and Sexual Activity  . Alcohol use: No    Comment: socially  . Drug use: No    Types: Marijuana  . Sexual activity: Yes  Lifestyle  . Physical activity:    Days per week: Not on file    Minutes per session: Not on file  . Stress: Not on file  Relationships  . Social connections:    Talks on phone: Not on file    Gets together: Not on file    Attends religious service: Not on file    Active member of club or organization: Not on file    Attends meetings of clubs or organizations: Not on file    Relationship status: Not on file  . Intimate partner violence:    Fear of current or ex partner: Not on file    Emotionally abused: Not on file    Physically abused: Not on file    Forced sexual activity: Not on file  Other Topics Concern  . Not on file  Social History Narrative  . Not on file     PHYSICAL EXAM  Vitals:   12/06/18 0803  BP: (!) 175/97  Pulse: 94  Weight: (!) 363 lb 3.2 oz (164.7 kg)  Height: 6' (1.829 m)   Body mass index is 49.26 kg/m.  Generalized: Well developed, morbidly obese male in no acute distress  Head: normocephalic and atraumatic,. Oropharynx benign  Neck: Supple,  Musculoskeletal: No deformity   Neurological examination   Mentation: Alert oriented to time, place, history taking. Attention span and concentration appropriate. Recent and remote memory intact.   Follows all commands speech and language fluent.   Cranial nerve II-XII: .Pupils were equal round reactive to light extraocular movements were full, visual field were full on confrontational test. Facial sensation and strength were normal. hearing was intact to finger rubbing bilaterally. Uvula tongue midline. head turning and shoulder shrug were normal and symmetric.Tongue protrusion into cheek strength was normal. Motor: normal bulk and tone, full strength in the BUE, BLE,  Sensory: normal and symmetric to light touch,   Coordination: finger-nose-finger, heel-to-shin bilaterally, no dysmetria Gait and Station: Rising up from seated position without assistance, normal stance,  moderate stride, good arm swing, smooth turning, able to perform tiptoe, and heel walking without difficulty. Tandem gait is steady  DIAGNOSTIC DATA (  LABS, IMAGING, TESTING) - I reviewed patient records, labs, notes, testing and imaging myself where available.  Lab Results  Component Value Date   WBC 12.7 (H) 01/04/2018   HGB 15.0 01/04/2018   HCT 45.4 01/04/2018   MCV 84 01/04/2018   PLT 329 01/04/2018      Component Value Date/Time   NA 139 01/04/2018 1749   K 4.7 01/04/2018 1749   CL 99 01/04/2018 1749   CO2 23 01/04/2018 1749   GLUCOSE 83 01/04/2018 1749   GLUCOSE 125 (H) 01/27/2014 1726   BUN 14 01/04/2018 1749   CREATININE 0.94 01/04/2018 1749   CALCIUM 9.8 01/04/2018 1749   PROT 7.0 01/04/2018 1749   ALBUMIN 4.3 01/04/2018 1749   AST 19 01/04/2018 1749   ALT 27 01/04/2018 1749   ALKPHOS 94 01/04/2018 1749   BILITOT 0.2 01/04/2018 1749   GFRNONAA 106 01/04/2018 1749   GFRAA 123 01/04/2018 1749   Lab Results  Component Value Date   CHOL 213 (H) 01/04/2018   HDL 26 (L) 01/04/2018   LDLCALC 136 (H) 01/04/2018   TRIG 255 (H) 01/04/2018   CHOLHDL 8.2 (H) 01/04/2018   Lab Results  Component Value Date   HGBA1C 5.8 (H) 01/04/2018   No results found for: [id number] Lab Results  Component  Value Date   TSH 1.760 01/04/2018      ASSESSMENT AND PLAN Clinton Taylor is a very pleasant 33 y.o.-year old male  with an underlying medical history of mood disorder including bipolar diagnosis, OCD, and anxiety, hypertension,  and morbid obesity with BMI of 49.2 who recently had sleep study that concluded severe obstructive sleep apnea.  He is here for his initial compliance check.  Patient also reports that he is trying to quit smoking.  Blood pressure noted to be high in the office today however he states he took it this morning and it was 140/80.  He is taking his blood pressure medications.Data dated 11/05/2018-12/04/2018 shows compliance greater than 4 hours at 97%.  Average usage 7 hours 1 minute pressure 10 to 20 cm.  EPR level 3 leak 95th percentile 45.2.  AHI 5.2 ESS 4.    PLAN: CPAP compliance 97% reviewed data with patient Patient has significant leak needs mask refit order sent Continue same settings Follow-up in 6 months Nilda Riggs, [facility], APRN  [facility] [address] [place], [place] [address] [phone]  I reviewed the above note and documentation by the Nurse Practitioner and agree with the history, physical exam, assessment and plan as outlined above. I was immediately available for face-to-face consultation. Huston Foley, MD, PhD [facility])

## 2018-12-06 ENCOUNTER — Ambulatory Visit: Payer: BLUE CROSS/BLUE SHIELD | Admitting: Nurse Practitioner

## 2018-12-06 ENCOUNTER — Encounter: Payer: Self-pay | Admitting: Nurse Practitioner

## 2018-12-06 DIAGNOSIS — G4733 Obstructive sleep apnea (adult) (pediatric): Secondary | ICD-10-CM

## 2018-12-06 DIAGNOSIS — Z9989 Dependence on other enabling machines and devices: Secondary | ICD-10-CM

## 2018-12-06 NOTE — Progress Notes (Signed)
[facility], Clinton Taylor, received cpap orders.

## 2018-12-06 NOTE — Patient Instructions (Signed)
CPAP compliance 97%  Patient has significant leak needs mask refit Continue same settings Follow-up in 6 months

## 2018-12-13 DIAGNOSIS — G4733 Obstructive sleep apnea (adult) (pediatric): Secondary | ICD-10-CM | POA: Diagnosis not present

## 2019-01-01 ENCOUNTER — Other Ambulatory Visit: Payer: Self-pay | Admitting: Physician Assistant

## 2019-01-01 DIAGNOSIS — F39 Unspecified mood [affective] disorder: Secondary | ICD-10-CM

## 2019-01-01 DIAGNOSIS — I10 Essential (primary) hypertension: Secondary | ICD-10-CM

## 2019-01-01 NOTE — Telephone Encounter (Signed)
Copied from CRM [id number]. Topic: Quick Communication - Rx Refill/Question >> Jan 01, 2019  9:30 AM Elliot Gault wrote: Medication: lisinopril-hydrochlorothiazide (ZESTORETIC) 20-12.5 MG tablet   Has the patient contacted their pharmacy? Yes   (Agent: If yes, when and what did the pharmacy advise?) contact PCP office  Preferred Pharmacy (with phone number or street name):  [id number] - [place], [place] - [address] [phone] (Phone) [phone] (Fax)  Agent: Please be advised that RX refills may take up to 3 business days. We ask that you follow-up with your pharmacy.

## 2019-01-01 NOTE — Telephone Encounter (Signed)
Patient needs to establish care.  / Appointment with Dr. Alvy Bimler on 02-14-2019.  Is dr. Alvy Bimler willing to provide medication until appointment? /

## 2019-01-02 MED ORDER — LISINOPRIL-HYDROCHLOROTHIAZIDE 20-12.5 MG PO TABS: 2.0000 | ORAL_TABLET | Freq: Every day | ORAL | 0 refills | Status: DC

## 2019-02-12 ENCOUNTER — Other Ambulatory Visit: Payer: Self-pay

## 2019-02-14 ENCOUNTER — Encounter: Payer: Self-pay | Admitting: Emergency Medicine

## 2019-02-14 ENCOUNTER — Other Ambulatory Visit: Payer: Self-pay

## 2019-02-14 ENCOUNTER — Telehealth (INDEPENDENT_AMBULATORY_CARE_PROVIDER_SITE_OTHER): Payer: BLUE CROSS/BLUE SHIELD | Admitting: Emergency Medicine

## 2019-02-14 DIAGNOSIS — Z9989 Dependence on other enabling machines and devices: Secondary | ICD-10-CM

## 2019-02-14 DIAGNOSIS — I10 Essential (primary) hypertension: Secondary | ICD-10-CM

## 2019-02-14 DIAGNOSIS — Z7689 Persons encountering health services in other specified circumstances: Secondary | ICD-10-CM

## 2019-02-14 DIAGNOSIS — F39 Unspecified mood [affective] disorder: Secondary | ICD-10-CM

## 2019-02-14 DIAGNOSIS — Z6841 Body Mass Index (BMI) 40.0 and over, adult: Secondary | ICD-10-CM

## 2019-02-14 DIAGNOSIS — G4733 Obstructive sleep apnea (adult) (pediatric): Secondary | ICD-10-CM

## 2019-02-14 MED ORDER — CITALOPRAM HYDROBROMIDE 40 MG PO TABS: ORAL_TABLET | ORAL | 1 refills | Status: DC

## 2019-02-14 MED ORDER — CLONIDINE HCL 0.2 MG PO TABS: 0.2000 mg | ORAL_TABLET | Freq: Every day | ORAL | 0 refills | Status: DC

## 2019-02-14 NOTE — Progress Notes (Signed)
Contacted patient to triage for appointment. Patient's former provider was Deliah Boston. Patient wants to establish care and needs medication refills on clonidine and citalopram. Also, patient wants to restart trazodone medication. Patient states he does not have any other complaints.

## 2019-02-14 NOTE — Progress Notes (Signed)
BP Readings from Last 3 Encounters:  12/06/18 (!) 175/97  07/19/18 (!) 168/104  01/04/18 119/76   Wt Readings from Last 3 Encounters:  12/06/18 (!) 363 lb 3.2 oz (164.7 kg)  07/19/18 (!) 351 lb (159.2 kg)  01/04/18 (!) 352 lb 9.6 oz (159.9 kg)      Telemedicine Encounter- SOAP NOTE Established Patient  This telephone encounter was conducted with the patient's (or proxy's) verbal consent via audio telecommunications: yes/no: Yes Patient was instructed to have this encounter in a suitably private space; and to only have persons present to whom they give permission to participate. In addition, patient identity was confirmed by use of name plus two identifiers (DOB and address).  I discussed the limitations, risks, security and privacy concerns of performing an evaluation and management service by telephone and the availability of in person appointments. I also discussed with the patient that there may be a patient responsible charge related to this service. The patient expressed understanding and agreed to proceed.  I spent a total of TIME; 0 MIN TO 60 MIN: 15 minutes talking with the patient or their proxy.  No chief complaint on file. Establish care and hypertension follow-up  Subjective   Clinton Taylor is a 33 y.o. male established patient. Telephone visit today to establish care with me on follow-up on hypertension.  Used to see PA Chestine Spore.  Has no complaints or medical concerns today.  Needs medication refill on Celexa and clonidine. Has the following active medical problems: 1.  Hypertension: On amlodipine and lisinopril-HCTZ, takes clonidine as needed.  Blood pressure this morning 130/90. 2.  Depression/OCD history, on Celexa 20 mg daily for many years.  States he sees psychiatrist on a regular basis. 3.  Obstructive sleep apnea, on CPAP.  Doing well.  Daytime sleepiness much improved. 4.  Morbid obesity, present weight 336 pounds. 5.  Prediabetes  HPI   Patient Active  Problem List   Diagnosis Date Noted  . Obstructive sleep apnea treated with continuous positive airway pressure (CPAP) 12/06/2018  . Hypertension 01/05/2018  . Mood disorder (HCC) 01/05/2018  . Class 3 severe obesity due to excess calories with serious comorbidity and body mass index (BMI) of 45.0 to 49.9 in adult (HCC) 01/05/2018  . Prediabetes 01/05/2018    Past Medical History:  Diagnosis Date  . Anger   . Anxiety   . Depression   . Hiatal hernia   . Hypertension   . Mood changes   . Obsessive compulsive disorder     Current Outpatient Medications  Medication Sig Dispense Refill  . amLODipine (NORVASC) 5 MG tablet Take 1 tablet (5 mg total) by mouth daily. 90 tablet 3  . citalopram (CELEXA) 40 MG tablet TAKE 1/2 TABLET(20 MG) BY MOUTH DAILY 45 tablet 1  . lisinopril-hydrochlorothiazide (ZESTORETIC) 20-12.5 MG tablet Take 2 tablets by mouth daily. 90 tablet 0  . cloNIDine (CATAPRES) 0.2 MG tablet Take 1 tablet (0.2 mg total) by mouth daily. For hypertension 60 tablet 0  . triamcinolone cream (KENALOG) 0.1 % Apply topically 2 (two) times daily. For skin rashes (Patient not taking: Reported on 02/14/2019) 30 g 0  . Varenicline Tartrate (CHANTIX STARTING MONTH PAK PO) Take by mouth.     No current facility-administered medications for this visit.     No Known Allergies  Social History   Socioeconomic History  . Marital status: Married    Spouse name: Not on file  . Number of children: Not on file  . Years of  education: Not on file  . Highest education level: Not on file  Occupational History  . Not on file  Social Needs  . Financial resource strain: Not on file  . Food insecurity:    Worry: Not on file    Inability: Not on file  . Transportation needs:    Medical: Not on file    Non-medical: Not on file  Tobacco Use  . Smoking status: Former Smoker    Packs/day: 1.00    Types: Cigarettes  . Smokeless tobacco: Former NeurosurgeonUser    Types: Chew  Substance and Sexual  Activity  . Alcohol use: No    Comment: socially  . Drug use: No    Types: Marijuana  . Sexual activity: Yes  Lifestyle  . Physical activity:    Days per week: Not on file    Minutes per session: Not on file  . Stress: Not on file  Relationships  . Social connections:    Talks on phone: Not on file    Gets together: Not on file    Attends religious service: Not on file    Active member of club or organization: Not on file    Attends meetings of clubs or organizations: Not on file    Relationship status: Not on file  . Intimate partner violence:    Fear of current or ex partner: Not on file    Emotionally abused: Not on file    Physically abused: Not on file    Forced sexual activity: Not on file  Other Topics Concern  . Not on file  Social History Narrative  . Not on file    Review of Systems  Constitutional: Negative.  Negative for chills and fever.  HENT: Negative.  Negative for congestion and sore throat.   Eyes: Negative.   Respiratory: Negative.  Negative for cough and shortness of breath.   Cardiovascular: Negative.  Negative for chest pain and palpitations.  Gastrointestinal: Negative.  Negative for abdominal pain, diarrhea, nausea and vomiting.  Genitourinary: Negative.   Musculoskeletal: Negative for myalgias.  Skin: Negative.  Negative for rash.  Neurological: Negative for dizziness and headaches.  Endo/Heme/Allergies: Negative.   All other systems reviewed and are negative.   Objective   Vitals as reported by the patient: Self-reported blood pressure 130/90 There were no vitals filed for this visit. Awake and oriented x3 in no apparent respiratory distress. Diagnoses and all orders for this visit:  Essential hypertension -     cloNIDine (CATAPRES) 0.2 MG tablet; Take 1 tablet (0.2 mg total) by mouth daily. For hypertension  Mood disorder (HCC) -     citalopram (CELEXA) 40 MG tablet; TAKE 1/2 TABLET(20 MG) BY MOUTH DAILY  Class 3 severe obesity due  to excess calories with serious comorbidity and body mass index (BMI) of 45.0 to 49.9 in adult [facility](HCC)  Obstructive sleep apnea treated with continuous positive airway pressure (CPAP)  Encounter to establish care    Clinically stable.  No medical concerns identified during this visit. Continue present medications.  No changes. Encouraged to follow-up with his psychiatrist in the next several months. Office visit in 3 months. I discussed the assessment and treatment plan with the patient. The patient was provided an opportunity to ask questions and all were answered. The patient agreed with the plan and demonstrated an understanding of the instructions.   The patient was advised to call back or seek an in-person evaluation if the symptoms worsen or if the condition fails  to improve as anticipated.  I provided 15 minutes of non-face-to-face time during this encounter.  [name], MD  Primary Care at [facility]

## 2019-03-20 DIAGNOSIS — G4733 Obstructive sleep apnea (adult) (pediatric): Secondary | ICD-10-CM | POA: Diagnosis not present

## 2019-03-27 NOTE — Telephone Encounter (Signed)
Saw Sagardia for TOC

## 2019-03-29 ENCOUNTER — Telehealth: Payer: Self-pay | Admitting: Emergency Medicine

## 2019-03-29 ENCOUNTER — Other Ambulatory Visit: Payer: Self-pay | Admitting: Physician Assistant

## 2019-03-29 DIAGNOSIS — I10 Essential (primary) hypertension: Secondary | ICD-10-CM

## 2019-03-29 NOTE — Telephone Encounter (Signed)
Pt requesting call

## 2019-03-29 NOTE — Telephone Encounter (Signed)
Copied from [place] [id number]. Topic: General - Other >> Mar 29, 2019  3:25 PM Rainey Pines A wrote: Patient would like a callback from Dr. Mitchel Honour nurse in regards to his anti-depressants.

## 2019-03-30 ENCOUNTER — Ambulatory Visit: Payer: Self-pay | Admitting: Emergency Medicine

## 2019-03-30 NOTE — Telephone Encounter (Signed)
Pt calling to request refill of Hydroxyzine and also states he would like to restart Trazodone. States he has been out "For months" and starting to feel "Panicky." Denies any suideal or homicidal thoughts. States has not been sleeping, feels tense, restless. H/O Bipolar. Was seeing "A psychiatrist   in [place] " who initially prescribed the hydroxyzine.  Suggested he call her as well for follow up. Additional refill requests were sent to [facility] yesterday. Aware he may need virtual appt. Advised to go to ED if symptoms worsen.   Please advise: [phone]  Reason for Disposition . Symptoms interfere with work or school  Answer Assessment - Initial Assessment Questions 1. CONCERN: "What happened that made you call today?"      2. ANXIETY SYMPTOM SCREENING: "Can you describe how you have been feeling?"  (e.g., tense, restless, panicky, anxious, keyed up, trouble sleeping, trouble concentrating)     All above 3. ONSET: "How long have you been feeling this way?"     worsening 4. RECURRENT: "Have you felt this way before?"  If yes: "What happened that time?" "What helped these feelings go away in the past?"      H/O bipolar 5. RISK OF HARM - SUICIDAL IDEATION:  "Do you ever have thoughts of hurting or killing yourself?"  (e.g., yes, no, no but preoccupation with thoughts about death)   - INTENT:  "Do you have thoughts of hurting or killing yourself right NOW?" (e.g., yes, no, N/A)   - PLAN: "Do you have a specific plan for how you would do this?" (e.g., gun, knife, overdose, no plan, N/A)     no 6. RISK OF HARM - HOMICIDAL IDEATION:  "Do you ever have thoughts of hurting or killing someone else?"  (e.g., yes, no, no but preoccupation with thoughts about death)   - INTENT:  "Do you have thoughts of hurting or killing someone right NOW?" (e.g., yes, no, N/A)   - PLAN: "Do you have a specific plan for how you would do this?" (e.g., gun, knife, no plan, N/A)      no 7. FUNCTIONAL IMPAIRMENT: "How  have things been going for you overall in your life? Have you had any more difficulties than usual doing your normal daily activities?"  (e.g., better, same, worse; self-care, school, work, interactions)      8. SUPPORT: "Who is with you now?" "Who do you live with?" "Do you have family or friends nearby who you can talk to?"      *No Answer* 9. THERAPIST: "Do you have a counselor or therapist? Name?"     Unsure of name has not seen lately 77. STRESSORS: "Has there been any new stress or recent changes in your life?"        11. CAFFEINE ABUSE: "Do you drink caffeinated beverages, and how much each day?" (e.g., coffee, tea, colas)        12. SUBSTANCE ABUSE: "Do you use any illegal drugs or alcohol?"        13. OTHER SYMPTOMS: "Do you have any other physical symptoms right now?" (e.g., chest pain, palpitations, difficulty breathing, fever)       no  Protocols used: ANXIETY AND PANIC ATTACK-A-AH

## 2019-03-30 NOTE — Telephone Encounter (Signed)
Could this pt please be schduled for a tel-med appointment or OV with Sagardia to go over these medications.

## 2019-04-09 DIAGNOSIS — Z20828 Contact with and (suspected) exposure to other viral communicable diseases: Secondary | ICD-10-CM | POA: Diagnosis not present

## 2019-04-12 ENCOUNTER — Other Ambulatory Visit: Payer: Self-pay

## 2019-04-12 ENCOUNTER — Encounter: Payer: Self-pay | Admitting: Emergency Medicine

## 2019-04-12 ENCOUNTER — Telehealth (INDEPENDENT_AMBULATORY_CARE_PROVIDER_SITE_OTHER): Payer: BC Managed Care – PPO | Admitting: Emergency Medicine

## 2019-04-12 ENCOUNTER — Other Ambulatory Visit: Payer: Self-pay | Admitting: Emergency Medicine

## 2019-04-12 VITALS — Ht 72.0 in | Wt 340.0 lb

## 2019-04-12 DIAGNOSIS — I10 Essential (primary) hypertension: Secondary | ICD-10-CM

## 2019-04-12 DIAGNOSIS — R7303 Prediabetes: Secondary | ICD-10-CM

## 2019-04-12 DIAGNOSIS — G4700 Insomnia, unspecified: Secondary | ICD-10-CM | POA: Diagnosis not present

## 2019-04-12 DIAGNOSIS — F39 Unspecified mood [affective] disorder: Secondary | ICD-10-CM

## 2019-04-12 MED ORDER — TRAZODONE HCL 50 MG PO TABS: 25.0000 mg | ORAL_TABLET | Freq: Every evening | ORAL | 3 refills | Status: DC | PRN

## 2019-04-12 NOTE — Telephone Encounter (Signed)
Forwarding medication refill to PCP for review. 

## 2019-04-12 NOTE — Progress Notes (Signed)
Called patient to triage for appointment. Patient is working today. Patient wants discuss trazodone for sleep issues. Patient states he does virtual appointment with his psychiatrist Dr Izetta Dakin twice a week. Patient states he has a virtual appointment with the doctor today at 4:00 pm.

## 2019-04-12 NOTE — Progress Notes (Signed)
Telemedicine Encounter- SOAP NOTE Established Patient  This telephone encounter was conducted with the patient's (or proxy's) verbal consent via audio telecommunications: yes/no: Yes Patient was instructed to have this encounter in a suitably private space; and to only have persons present to whom they give permission to participate. In addition, patient identity was confirmed by use of name plus two identifiers (DOB and address).  I discussed the limitations, risks, security and privacy concerns of performing an evaluation and management service by telephone and the availability of in person appointments. I also discussed with the patient that there may be a patient responsible charge related to this service. The patient expressed understanding and agreed to proceed.  I spent a total of TIME; 0 MIN TO 60 MIN: 15 minutes talking with the patient or their proxy.  No chief complaint on file. Insomnia  Subjective   Clinton Taylor is a 33 y.o. established patient. Telephone visit today inquiring about trazodone for sleep disorder.  Patient has an extensive psychiatric history including severe obsessive compulsive disorder with several phobias and fears.  Presently seeing a psychiatrist twice a week.  Has appointment with her today.  Has been on trazodone before with good results and would like to try it again.  No other complaints or medical concerns today.  HPI   Patient Active Problem List   Diagnosis Date Noted  . Obstructive sleep apnea treated with continuous positive airway pressure (CPAP) 12/06/2018  . Hypertension 01/05/2018  . Mood disorder (HCC) 01/05/2018  . Class 3 severe obesity due to excess calories with serious comorbidity and body mass index (BMI) of 45.0 to 49.9 in adult (HCC) 01/05/2018  . Prediabetes 01/05/2018    Past Medical History:  Diagnosis Date  . Anger   . Anxiety   . Depression   . Hiatal hernia   . Hypertension   . Mood changes   . Obsessive  compulsive disorder     Current Outpatient Medications  Medication Sig Dispense Refill  . amLODipine (NORVASC) 5 MG tablet TAKE 1 TABLET(5 MG) BY MOUTH DAILY 30 tablet 0  . citalopram (CELEXA) 40 MG tablet TAKE 1/2 TABLET(20 MG) BY MOUTH DAILY 45 tablet 1  . cloNIDine (CATAPRES) 0.2 MG tablet Take 1 tablet (0.2 mg total) by mouth daily. For hypertension 60 tablet 0  . lisinopril-hydrochlorothiazide (ZESTORETIC) 20-12.5 MG tablet TAKE 2 TABLETS BY MOUTH DAILY 180 tablet 0  . triamcinolone cream (KENALOG) 0.1 % Apply topically 2 (two) times daily. For skin rashes (Patient not taking: Reported on 04/12/2019) 30 g 0  . Varenicline Tartrate (CHANTIX STARTING MONTH PAK PO) Take by mouth.     No current facility-administered medications for this visit.     Not on File  Social History   Socioeconomic History  . Marital status: Married    Spouse name: Not on file  . Number of children: Not on file  . Years of education: Not on file  . Highest education level: Not on file  Occupational History  . Not on file  Social Needs  . Financial resource strain: Not on file  . Food insecurity    Worry: Not on file    Inability: Not on file  . Transportation needs    Medical: Not on file    Non-medical: Not on file  Tobacco Use  . Smoking status: Former Smoker    Packs/day: 1.00    Types: Cigarettes  . Smokeless tobacco: Former NeurosurgeonUser    Types: Chew  Substance and  Sexual Activity  . Alcohol use: No    Comment: socially  . Drug use: No    Types: Marijuana  . Sexual activity: Yes  Lifestyle  . Physical activity    Days per week: Not on file    Minutes per session: Not on file  . Stress: Not on file  Relationships  . Social Herbalist on phone: Not on file    Gets together: Not on file    Attends religious service: Not on file    Active member of club or organization: Not on file    Attends meetings of clubs or organizations: Not on file    Relationship status: Not on file   . Intimate partner violence    Fear of current or ex partner: Not on file    Emotionally abused: Not on file    Physically abused: Not on file    Forced sexual activity: Not on file  Other Topics Concern  . Not on file  Social History Narrative  . Not on file    Review of Systems  Constitutional: Negative.  Negative for chills and fever.  HENT: Negative.  Negative for congestion and sore throat.   Eyes: Negative.   Respiratory: Negative.  Negative for cough and shortness of breath.   Cardiovascular: Negative.  Negative for chest pain and palpitations.  Gastrointestinal: Negative.  Negative for abdominal pain, diarrhea, nausea and vomiting.  Genitourinary: Negative.  Negative for dysuria.  Musculoskeletal: Negative.   Skin: Negative.  Negative for rash.  Neurological: Negative.  Negative for dizziness and headaches.  Endo/Heme/Allergies: Negative.   Psychiatric/Behavioral: The patient has insomnia.   All other systems reviewed and are negative.   Objective   Vitals as reported by the patient: Today's Vitals   04/12/19 1011  Weight: (!) 340 lb (154.2 kg)  Height: 6' (1.829 m)  Awake and oriented x3 in no apparent respiratory distress  There are no diagnoses linked to this encounter.  Diagnoses and all orders for this visit:  Insomnia, unspecified type -     traZODone (DESYREL) 50 MG tablet; Take 0.5-1 tablets (25-50 mg total) by mouth at bedtime as needed for sleep.  Mood disorder (HCC)  Hypertension, unspecified type  Prediabetes   Clinically stable.  No red flag signs or symptoms.  Continue present medications and add low-dose trazodone at bedtime.  Follow-up with psychiatrist. Office visit in 3 months.  I discussed the assessment and treatment plan with the patient. The patient was provided an opportunity to ask questions and all were answered. The patient agreed with the plan and demonstrated an understanding of the instructions.   The patient was advised to  call back or seek an in-person evaluation if the symptoms worsen or if the condition fails to improve as anticipated.  I provided 15 minutes of non-face-to-face time during this encounter.  [name], MD  Primary Care at [facility]

## 2019-05-03 ENCOUNTER — Other Ambulatory Visit: Payer: Self-pay | Admitting: Emergency Medicine

## 2019-05-03 DIAGNOSIS — I10 Essential (primary) hypertension: Secondary | ICD-10-CM

## 2019-05-09 DIAGNOSIS — F422 Mixed obsessional thoughts and acts: Secondary | ICD-10-CM | POA: Diagnosis not present

## 2019-05-28 DIAGNOSIS — F422 Mixed obsessional thoughts and acts: Secondary | ICD-10-CM | POA: Diagnosis not present

## 2019-06-04 DIAGNOSIS — F422 Mixed obsessional thoughts and acts: Secondary | ICD-10-CM | POA: Diagnosis not present

## 2019-06-06 ENCOUNTER — Ambulatory Visit: Payer: BLUE CROSS/BLUE SHIELD | Admitting: Neurology

## 2019-06-07 ENCOUNTER — Encounter: Payer: Self-pay | Admitting: Neurology

## 2019-06-11 DIAGNOSIS — F422 Mixed obsessional thoughts and acts: Secondary | ICD-10-CM | POA: Diagnosis not present

## 2019-06-13 ENCOUNTER — Other Ambulatory Visit: Payer: Self-pay | Admitting: Emergency Medicine

## 2019-06-13 DIAGNOSIS — I10 Essential (primary) hypertension: Secondary | ICD-10-CM

## 2019-06-18 DIAGNOSIS — F422 Mixed obsessional thoughts and acts: Secondary | ICD-10-CM | POA: Diagnosis not present

## 2019-06-25 DIAGNOSIS — F422 Mixed obsessional thoughts and acts: Secondary | ICD-10-CM | POA: Diagnosis not present

## 2019-06-26 DIAGNOSIS — F3170 Bipolar disorder, currently in remission, most recent episode unspecified: Secondary | ICD-10-CM | POA: Diagnosis not present

## 2019-06-26 DIAGNOSIS — F411 Generalized anxiety disorder: Secondary | ICD-10-CM | POA: Diagnosis not present

## 2019-06-26 DIAGNOSIS — F4310 Post-traumatic stress disorder, unspecified: Secondary | ICD-10-CM | POA: Diagnosis not present

## 2019-07-02 DIAGNOSIS — F422 Mixed obsessional thoughts and acts: Secondary | ICD-10-CM | POA: Diagnosis not present

## 2019-07-09 DIAGNOSIS — F422 Mixed obsessional thoughts and acts: Secondary | ICD-10-CM | POA: Diagnosis not present

## 2019-07-16 DIAGNOSIS — F422 Mixed obsessional thoughts and acts: Secondary | ICD-10-CM | POA: Diagnosis not present

## 2019-07-26 ENCOUNTER — Other Ambulatory Visit: Payer: Self-pay | Admitting: Emergency Medicine

## 2019-07-26 ENCOUNTER — Other Ambulatory Visit: Payer: Self-pay | Admitting: Family Medicine

## 2019-07-26 DIAGNOSIS — I10 Essential (primary) hypertension: Secondary | ICD-10-CM

## 2019-07-27 DIAGNOSIS — F422 Mixed obsessional thoughts and acts: Secondary | ICD-10-CM | POA: Diagnosis not present

## 2019-08-03 DIAGNOSIS — F422 Mixed obsessional thoughts and acts: Secondary | ICD-10-CM | POA: Diagnosis not present

## 2019-08-06 ENCOUNTER — Other Ambulatory Visit: Payer: Self-pay | Admitting: Emergency Medicine

## 2019-08-06 DIAGNOSIS — F4310 Post-traumatic stress disorder, unspecified: Secondary | ICD-10-CM | POA: Diagnosis not present

## 2019-08-06 DIAGNOSIS — F411 Generalized anxiety disorder: Secondary | ICD-10-CM | POA: Diagnosis not present

## 2019-08-06 DIAGNOSIS — F3170 Bipolar disorder, currently in remission, most recent episode unspecified: Secondary | ICD-10-CM | POA: Diagnosis not present

## 2019-08-06 DIAGNOSIS — I10 Essential (primary) hypertension: Secondary | ICD-10-CM

## 2019-08-10 DIAGNOSIS — F422 Mixed obsessional thoughts and acts: Secondary | ICD-10-CM | POA: Diagnosis not present

## 2019-08-16 ENCOUNTER — Other Ambulatory Visit: Payer: Self-pay | Admitting: Emergency Medicine

## 2019-08-16 DIAGNOSIS — I10 Essential (primary) hypertension: Secondary | ICD-10-CM

## 2019-08-17 DIAGNOSIS — F422 Mixed obsessional thoughts and acts: Secondary | ICD-10-CM | POA: Diagnosis not present

## 2019-08-22 DIAGNOSIS — F332 Major depressive disorder, recurrent severe without psychotic features: Secondary | ICD-10-CM | POA: Diagnosis not present

## 2019-08-24 DIAGNOSIS — F422 Mixed obsessional thoughts and acts: Secondary | ICD-10-CM | POA: Diagnosis not present

## 2019-08-30 DIAGNOSIS — F422 Mixed obsessional thoughts and acts: Secondary | ICD-10-CM | POA: Diagnosis not present

## 2019-08-31 ENCOUNTER — Encounter (HOSPITAL_COMMUNITY): Payer: Self-pay | Admitting: Emergency Medicine

## 2019-08-31 ENCOUNTER — Emergency Department (HOSPITAL_COMMUNITY)
Admission: EM | Admit: 2019-08-31 | Discharge: 2019-08-31 | Disposition: A | Discharge status: Other Acute Inpatient Hospital | Payer: BLUE CROSS/BLUE SHIELD | Attending: Emergency Medicine | Admitting: Emergency Medicine

## 2019-08-31 ENCOUNTER — Emergency Department (HOSPITAL_COMMUNITY): Payer: BLUE CROSS/BLUE SHIELD

## 2019-08-31 ENCOUNTER — Other Ambulatory Visit: Payer: Self-pay

## 2019-08-31 DIAGNOSIS — R45851 Suicidal ideations: Secondary | ICD-10-CM | POA: Insufficient documentation

## 2019-08-31 DIAGNOSIS — F429 Obsessive-compulsive disorder, unspecified: Secondary | ICD-10-CM | POA: Diagnosis not present

## 2019-08-31 DIAGNOSIS — I10 Essential (primary) hypertension: Secondary | ICD-10-CM | POA: Insufficient documentation

## 2019-08-31 DIAGNOSIS — R0789 Other chest pain: Secondary | ICD-10-CM | POA: Insufficient documentation

## 2019-08-31 DIAGNOSIS — Z87891 Personal history of nicotine dependence: Secondary | ICD-10-CM | POA: Insufficient documentation

## 2019-08-31 DIAGNOSIS — F39 Unspecified mood [affective] disorder: Secondary | ICD-10-CM | POA: Insufficient documentation

## 2019-08-31 DIAGNOSIS — Z20828 Contact with and (suspected) exposure to other viral communicable diseases: Secondary | ICD-10-CM | POA: Diagnosis not present

## 2019-08-31 DIAGNOSIS — Z79899 Other long term (current) drug therapy: Secondary | ICD-10-CM | POA: Diagnosis not present

## 2019-08-31 DIAGNOSIS — F319 Bipolar disorder, unspecified: Secondary | ICD-10-CM | POA: Diagnosis not present

## 2019-08-31 DIAGNOSIS — R531 Weakness: Secondary | ICD-10-CM | POA: Insufficient documentation

## 2019-08-31 DIAGNOSIS — R079 Chest pain, unspecified: Secondary | ICD-10-CM

## 2019-08-31 DIAGNOSIS — F329 Major depressive disorder, single episode, unspecified: Secondary | ICD-10-CM | POA: Insufficient documentation

## 2019-08-31 DIAGNOSIS — F419 Anxiety disorder, unspecified: Secondary | ICD-10-CM | POA: Diagnosis not present

## 2019-08-31 DIAGNOSIS — F332 Major depressive disorder, recurrent severe without psychotic features: Secondary | ICD-10-CM | POA: Diagnosis present

## 2019-08-31 LAB — CBC
HCT: 48.2 % (ref 39.0–52.0)
Hemoglobin: 15.7 g/dL (ref 13.0–17.0)
MCH: 27 pg (ref 26.0–34.0)
MCHC: 32.6 g/dL (ref 30.0–36.0)
MCV: 82.8 fL (ref 80.0–100.0)
Platelets: 309 10*3/uL (ref 150–400)
RBC: 5.82 MIL/uL — ABNORMAL HIGH (ref 4.22–5.81)
RDW: 15.2 % (ref 11.5–15.5)
WBC: 12.5 10*3/uL — ABNORMAL HIGH (ref 4.0–10.5)
nRBC: 0 % (ref 0.0–0.2)

## 2019-08-31 LAB — TROPONIN I (HIGH SENSITIVITY)
Troponin I (High Sensitivity): 5 ng/L (ref <18)
Troponin I (High Sensitivity): 4 ng/L (ref <18)

## 2019-08-31 LAB — BASIC METABOLIC PANEL
Anion gap: 15 (ref 5–15)
BUN: 11 mg/dL (ref 6–20)
CO2: 22 mmol/L (ref 22–32)
Calcium: 9.3 mg/dL (ref 8.9–10.3)
Chloride: 100 mmol/L (ref 98–111)
Creatinine, Ser: 0.9 mg/dL (ref 0.61–1.24)
GFR calc Af Amer: >60 mL/min (ref >60)
GFR calc non Af Amer: >60 mL/min (ref >60)
Glucose, Bld: 92 mg/dL (ref 70–99)
Potassium: 4.2 mmol/L (ref 3.5–5.1)
Sodium: 137 mmol/L (ref 135–145)

## 2019-08-31 LAB — HEPATIC FUNCTION PANEL
ALT: 24 U/L (ref 0–44)
AST: 21 U/L (ref 15–41)
Albumin: 3.8 g/dL (ref 3.5–5.0)
Alkaline Phosphatase: 91 U/L (ref 38–126)
Bilirubin, Direct: <0.1 mg/dL (ref 0.0–0.2)
Total Bilirubin: 0.6 mg/dL (ref 0.3–1.2)
Total Protein: 7.2 g/dL (ref 6.5–8.1)

## 2019-08-31 LAB — ETHANOL: Alcohol, Ethyl (B): <10 mg/dL (ref <10)

## 2019-08-31 LAB — SARS CORONAVIRUS 2 (TAT 6-24 HRS): SARS Coronavirus 2: NEGATIVE

## 2019-08-31 LAB — ACETAMINOPHEN LEVEL: Acetaminophen (Tylenol), Serum: <10 ug/mL — ABNORMAL LOW (ref 10–30)

## 2019-08-31 LAB — RAPID URINE DRUG SCREEN, HOSP PERFORMED
Amphetamines: NOT DETECTED
Barbiturates: NOT DETECTED
Benzodiazepines: NOT DETECTED
Cocaine: NOT DETECTED
Opiates: NOT DETECTED
Tetrahydrocannabinol: NOT DETECTED

## 2019-08-31 LAB — SALICYLATE LEVEL: Salicylate Lvl: <7 mg/dL (ref 2.8–30.0)

## 2019-08-31 MED ORDER — ALUM & MAG HYDROXIDE-SIMETH 200-200-20 MG/5ML PO SUSP: 30.0000 mL | ORAL | Status: DC | PRN

## 2019-08-31 MED ORDER — MAGNESIUM HYDROXIDE 400 MG/5ML PO SUSP: 30.0000 mL | Freq: Every day | ORAL | Status: DC | PRN

## 2019-08-31 MED ORDER — TRAZODONE HCL 100 MG PO TABS
100.0000 mg | ORAL_TABLET | Freq: Every evening | ORAL | Status: DC | PRN
  Administered 2019-09-01: 100 mg via ORAL
  Filled 2019-08-31: qty 1

## 2019-08-31 MED ORDER — ASPIRIN 81 MG PO CHEW
324.0000 mg | CHEWABLE_TABLET | Freq: Once | ORAL | Status: AC
  Administered 2019-08-31: 324 mg via ORAL
  Filled 2019-08-31: qty 4

## 2019-08-31 MED ORDER — CITALOPRAM HYDROBROMIDE 20 MG PO TABS
20.0000 mg | ORAL_TABLET | Freq: Every day | ORAL | Status: DC
  Administered 2019-09-01: 20 mg via ORAL
  Filled 2019-08-31: qty 1

## 2019-08-31 MED ORDER — AMLODIPINE BESYLATE 5 MG PO TABS
5.0000 mg | ORAL_TABLET | Freq: Every day | ORAL | Status: DC
  Administered 2019-09-01: 5 mg via ORAL
  Filled 2019-08-31: qty 1

## 2019-08-31 MED ORDER — HYDROXYZINE HCL 25 MG PO TABS
25.0000 mg | ORAL_TABLET | Freq: Three times a day (TID) | ORAL | Status: DC | PRN
  Administered 2019-09-01: 25 mg via ORAL
  Filled 2019-08-31: qty 1

## 2019-08-31 MED ORDER — CLONIDINE HCL 0.1 MG PO TABS
0.1000 mg | ORAL_TABLET | Freq: Every day | ORAL | Status: DC
  Administered 2019-09-01: 0.1 mg via ORAL
  Filled 2019-08-31: qty 1

## 2019-08-31 MED ORDER — LISINOPRIL-HYDROCHLOROTHIAZIDE 20-12.5 MG PO TABS: 2.0000 | ORAL_TABLET | Freq: Every day | ORAL | Status: DC

## 2019-08-31 MED ORDER — NICOTINE 21 MG/24HR TD PT24
21.0000 mg | MEDICATED_PATCH | Freq: Once | TRANSDERMAL | Status: DC
  Administered 2019-08-31: 21 mg via TRANSDERMAL
  Filled 2019-08-31: qty 1

## 2019-08-31 MED ORDER — ACETAMINOPHEN 325 MG PO TABS: 650.0000 mg | ORAL_TABLET | Freq: Four times a day (QID) | ORAL | Status: DC | PRN

## 2019-08-31 NOTE — ED Notes (Signed)
Pt. Able to walk well, went to go use the restroom.

## 2019-08-31 NOTE — ED Notes (Signed)
Safe transport here to transport pt to [facility]. Pt calm and cooperative. Belongings given to transporter. Pt walked out with this Microbiologist.

## 2019-08-31 NOTE — ED Notes (Signed)
MD at bedside. Pt agreeable to go to [facility] for the night. Pt is expecting to be released tomorrow morning. Pt is upset that he cannot have his phone and his clothes. Pt states that he will get them when he gets to [facility]. Told pt that it is policy that everything be locked up and he could get them upon dc. Pt states that we haven't done anything for him here and that he was supposed to be gone "a long time ago". Pt aware that we had to wait for his covid results, this RN told him multiple times.
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
## 2019-08-31 NOTE — ED Notes (Signed)
Messaged [facility] to find out what unit to call report to. There has not been a response as of this time.

## 2019-08-31 NOTE — ED Provider Notes (Signed)
[facility] EMERGENCY DEPARTMENT Provider Note   CSN: [id number] Arrival date & time: 08/31/19  1006     History   Chief Complaint Chief Complaint  Patient presents with  . Chest Pain  . Suicidal    HPI Clinton Taylor is a 33 y.o. male with PMHx HTN, depression, anxiety, OCD, and OSA on CPAP who presents to the ED today with complaints of chest pain as well as SI.   He reports that he has felt increasing stress and anxiety at home.  He states that he and his family recently moved back to the area from [place] and he feels that this has triggered him.  He states that he does not feel like he should be in a marriage given his psychiatric illnesses.  He states that he does not want to do this to his wife and his kids but sometimes feels like it would be better for them.  He states that he has felt suicidal but does not have a specific plan.  She was evaluated in triage by another provider and had reported that he plan to jump off an overpass.  Reports that he has not been sleeping very well either due to increased stress and anxiety.  He states that he feels like if he could just get some help as well as some good sleep he would feel better.  He is voluntary at this time.  He reports that this morning around 8 AM he began having right-sided chest pain that went across to the left side, he states it lasted a couple of minutes prior to subsiding.  He denies any shortness of breath, nausea, vomiting, palpitations, leg swelling.  Patient states that he has had chest pains in the past with panic attacks but states that this felt worse.  Endorses that his bilateral legs felt "weak" but states that he was still able to move them without difficulty.  Patient states that he was concerned he was either having a heart attack or stroke so he came to the ED for further evaluation.  Is a former smoker.  Denies any family history of CAD.  No history of DVT/PE.  No recent prolonged travel or  immobilization.  Denies hormone use.       Past Medical History:  Diagnosis Date  . Anger   . Anxiety   . Depression   . Hiatal hernia   . Hypertension   . Mood changes   . Obsessive compulsive disorder     Patient Active Problem List   Diagnosis Date Noted  . Obstructive sleep apnea treated with continuous positive airway pressure (CPAP) 12/06/2018  . Hypertension 01/05/2018  . Mood disorder (HCC) 01/05/2018  . Class 3 severe obesity due to excess calories with serious comorbidity and body mass index (BMI) of 45.0 to 49.9 in adult (HCC) 01/05/2018  . Prediabetes 01/05/2018    History reviewed. No pertinent surgical history.      Home Medications    Prior to Admission medications   Medication Sig Start Date End Date Taking? Authorizing Provider  amLODipine (NORVASC) 5 MG tablet TAKE 1 TABLET(5 MG) BY MOUTH DAILY 08/06/19   Georgina Quint, MD  citalopram (CELEXA) 40 MG tablet TAKE 1/2 TABLET(20 MG) BY MOUTH DAILY 02/14/19   Georgina Quint, MD  cloNIDine (CATAPRES) 0.2 MG tablet TAKE 1 TABLET(0.2 MG) BY MOUTH DAILY FOR HIGH BLOOD PRESSURE 08/16/19   Georgina Quint, MD  lisinopril-hydrochlorothiazide (ZESTORETIC) 20-12.5 MG tablet TAKE 2 TABLETS  BY MOUTH DAILY 07/26/19   [name], MD  traZODone (DESYREL) 50 MG tablet Take 0.5-1 tablets (25-50 mg total) by mouth at bedtime as needed for sleep. 04/12/19   [name], MD  triamcinolone cream (KENALOG) 0.1 % Apply topically 2 (two) times daily. For skin rashes Patient not taking: Reported on 04/12/2019 02/01/14   Lindell Spar I, NP  Varenicline Tartrate (CHANTIX STARTING MONTH PAK PO) Take by mouth.    [provider]    Family History Family History  Problem Relation Age of Onset  . OCD Mother   . Alcohol abuse Father   . Bipolar disorder Father   . Drug abuse Father   . Alcohol abuse Maternal Uncle   . Alcohol abuse Paternal Uncle   . OCD Maternal Grandfather   .  Alcohol abuse Paternal Grandmother   . Drug abuse Paternal Grandmother     Social History Social History   Tobacco Use  . Smoking status: Former Smoker    Packs/day: 1.00    Types: Cigarettes  . Smokeless tobacco: Former Systems developer    Types: Chew  Substance Use Topics  . Alcohol use: No    Comment: socially  . Drug use: No    Types: Marijuana     Allergies   Patient has no known allergies.   Review of Systems Review of Systems  Constitutional: Negative for chills and fever.  HENT: Negative for congestion.   Eyes: Negative for visual disturbance.  Respiratory: Negative for cough and shortness of breath.   Cardiovascular: Positive for chest pain (resolved). Negative for palpitations and leg swelling.  Gastrointestinal: Negative for abdominal pain, nausea and vomiting.  Genitourinary: Negative for dysuria.  Musculoskeletal: Negative for myalgias.  Skin: Negative for rash.  Neurological: Negative for headaches.  Psychiatric/Behavioral: Positive for sleep disturbance and suicidal ideas.     Physical Exam Updated Vital Signs BP (!) 142/82   Pulse 88   Temp 98.9 F (37.2 C) (Oral)   Resp 16   Ht 6' (1.829 m)   Wt (!) 157.9 kg   SpO2 97%   BMI 47.20 kg/m   Physical Exam Vitals signs and nursing note reviewed.  Constitutional:      Appearance: He is not ill-appearing or diaphoretic.  HENT:     Head: Normocephalic and atraumatic.  Eyes:     Conjunctiva/sclera: Conjunctivae normal.  Neck:     Musculoskeletal: Neck supple.  Cardiovascular:     Rate and Rhythm: Normal rate and regular rhythm.     Pulses:          Radial pulses are 2+ on the right side and 2+ on the left side.       Dorsalis pedis pulses are 2+ on the right side and 2+ on the left side.     Heart sounds: Normal heart sounds.  Pulmonary:     Effort: Pulmonary effort is normal.     Breath sounds: Normal breath sounds. No decreased breath sounds, wheezing, rhonchi or rales.  Chest:     Chest wall:  No tenderness.  Abdominal:     Palpations: Abdomen is soft.     Tenderness: There is no abdominal tenderness. There is no guarding or rebound.  Musculoskeletal:     Right lower leg: He exhibits no tenderness. No edema.     Left lower leg: He exhibits no tenderness. No edema.  Skin:    General: Skin is warm and dry.  Neurological:     Mental Status:  He is alert.     Comments: CN 3-12 grossly intact A&O x4 GCS 15 Sensation and strength intact Gait nonataxic including with tandem walking Coordination with finger-to-nose WNL Neg romberg, neg pronator drift      ED Treatments / Results  Labs (all labs ordered are listed, but only abnormal results are displayed) Labs Reviewed  CBC - Abnormal; Notable for the following components:      Result Value   WBC 12.5 (*)    RBC 5.82 (*)    All other components within normal limits  ACETAMINOPHEN LEVEL - Abnormal; Notable for the following components:   Acetaminophen (Tylenol), Serum <10 (*)    All other components within normal limits  SARS CORONAVIRUS 2 (TAT 6-24 HRS)  BASIC METABOLIC PANEL  ETHANOL  RAPID URINE DRUG SCREEN, HOSP PERFORMED  SALICYLATE LEVEL  HEPATIC FUNCTION PANEL  TROPONIN I (HIGH SENSITIVITY)  TROPONIN I (HIGH SENSITIVITY)    EKG EKG Interpretation  Date/Time:  Friday August 31 2019 10:27:40 EST Ventricular Rate:  85 PR Interval:  152 QRS Duration: 100 QT Interval:  372 QTC Calculation: 442 R Axis:   -3 Text Interpretation: Normal sinus rhythm Normal ECG Reconfirmed by Kristine RoyalMessick, Peter [phone]([id number]) on 08/31/2019 3:57:10 PM   Radiology Dg Chest 2 View  Result Date: 08/31/2019 CLINICAL DATA:  Chest pain. EXAM: CHEST - 2 VIEW COMPARISON:  January 27, 2014. FINDINGS: The heart size and mediastinal contours are within normal limits. Both lungs are clear. No pneumothorax or pleural effusion is noted. The visualized skeletal structures are unremarkable. IMPRESSION: No active cardiopulmonary disease. Electronically  Signed   By: Lupita RaiderJames  Green Jr M.D.   On: 08/31/2019 11:05    Procedures Procedures (including critical care time)  Medications Ordered in ED Medications  aspirin chewable tablet 324 mg (has no administration in time range)     Initial Impression / Assessment and Plan / ED Course  I have reviewed the triage vital signs and the nursing notes.  Pertinent labs & imaging results that were available during my care of the patient were reviewed by me and considered in my medical decision making (see chart for details).  33 year old male who presents to the ED today complaining of substernal chest pain that began around 8 this morning and resolved on its own in a couple of minutes as well as increased stress/anxiety and suicidal ideation.  he endorsed previous provider that he had plan to jump out in overpass but denies this to me currently.  He does state that he feels like his family would be better off without him but he does not want to do that to them.  Patient with a history of CAD.  He does have some risk factors including hypertension and prediabetes as well as former smoker.  EKG today without ischemic changes.  Chest x-ray clear.  Initial troponin of 5, will repeat.  And is PERC negative.  No signs stable today.  Patient afebrile without tachycardia or tachypnea.  He is currently chest pain-free.  Lab work obtained given complaint of suicidal ideation, will medically clear to repeat troponin and have TTS evaluate.  Patient is currently voluntary.   Repeat troponin of 4.  Leukocytosis at 12,500 although pt chronically elevated; appears to be around baseline. Patient medically cleared at this time. Awaiting TTS consult.   Clinical Course as of Aug 30 1745  Fri Aug 31, 2019  1345 WBC(!): 12.5 [MV]    Clinical Course User Index [MV] Tanda RockersVenter, Treshawn Allen, [place]PA-C  TTS recommends overnight observation.        Final Clinical Impressions(s) / ED Diagnoses   Final diagnoses:  Mood disorder (HCC)   Nonspecific chest pain    ED Discharge Orders    None       Tanda Rockers, PA-C 08/31/19 1746    Blane Ohara, MD 09/01/19 1547

## 2019-08-31 NOTE — ED Notes (Signed)
Safe transport called to say they are almost here. Pt aware.

## 2019-08-31 NOTE — ED Triage Notes (Signed)
Patient c/o left sided chest pain starting last night radiating into right side began this am. Patient adds some weakness to his bilateral legs starting this am. Patient is ambulatory. Reports a lot of stress lately and not been sleeping well.

## 2019-08-31 NOTE — ED Notes (Signed)
Pt. Spoke to TTS. 

## 2019-08-31 NOTE — ED Notes (Signed)
Pt. Given 2 cheese sticks and some crackers.

## 2019-08-31 NOTE — ED Provider Notes (Signed)
Patient placed in Quick Look pathway, seen and evaluated   Chief Complaint: CP, anxiety, SI  HPI:   CP substernal pressure beginning at 8am at work, non radiation, self resolved, no clear aggravating or alleviating factors.  Additionally patient reports he believes this to be secondary to increase stress and anxiety. He reports trouble at home and increased stress and worry about his marriage and fearful of divorce. Patient reports depression as well as thoughts of suicide. He reports he has plan to jump from an overpass but reports that he does not feel he could do that to his family. He states he feels as if things around him are not real and this derealization has been going on for some time and feels this may be due to his depression medication.   ROS: + CP, Anxiety, Depression, Derealization, SI            - Fever, Chills, HA, Neck pain, vision changes, SOB, Cough, Hemoptysis, Abd pain ,N/V/D, extremity pain/swelling, color change, Hallucinations, self harm, injury, illicit drug use.  Physical Exam:   Gen: No distress  Neuro: Awake and Alert  Skin: Warm    Focused Exam: Anxious, no acute distress, CN intact, Full ROM of neck, HRRR without murmur, LCTAB, Abdomen soft nontender without peritoneal signs, NVI x 4, equal strenght and sensation x4, no swelling or sign of DVT.  CP workup ordered by nursing staff; med clearance labs added, TTS placed, SI precautions ordered, staff made aware.  Initiation of care has begun. The patient has been counseled on the process, plan, and necessity for staying for the completion/evaluation, and the remainder of the medical screening examination    Gari Crown 08/31/19 1103    Valarie Merino, MD 08/31/19 [phone]

## 2019-08-31 NOTE — ED Notes (Signed)
Pt found in triage and brought back to room 2.

## 2019-08-31 NOTE — ED Notes (Signed)
Safe transport called to transfer pt to [facility]. States that they are on the way back from [facility] and that it would be 35-45 minutes. Agricultural consultant and pt made aware.

## 2019-08-31 NOTE — ED Notes (Signed)
Spoke with Clinton Taylor at [facility] and he agrees with the previous assessment because pt has a plan to jump off of a bridge and that if pt leaves, he needs to be IVC'd. Spoke with ED MD and will IVC if needed. MD to come to bedside to speak with pt.

## 2019-08-31 NOTE — ED Notes (Signed)
Report has been given to [facility], I was informed that Covid has to result prior to transport.

## 2019-08-31 NOTE — ED Notes (Signed)
Pt making phone call at nurses sta.

## 2019-08-31 NOTE — ED Notes (Signed)
Pt.s currently talking to wife. At the nurses station.

## 2019-08-31 NOTE — ED Notes (Signed)
Pt., transferred to Purple zone RM. 50.

## 2019-08-31 NOTE — ED Notes (Signed)
Called pt name X3 for VS. No answer.

## 2019-08-31 NOTE — ED Notes (Signed)
Pt named called X3 to be roomed. No answer.

## 2019-08-31 NOTE — ED Notes (Addendum)
Pt on the phone talking with wife. Pt now wants to leave to go home. Wants me to see if this is possible.

## 2019-09-01 ENCOUNTER — Other Ambulatory Visit: Payer: Self-pay

## 2019-09-01 ENCOUNTER — Encounter (HOSPITAL_COMMUNITY): Payer: Self-pay

## 2019-09-01 ENCOUNTER — Observation Stay (HOSPITAL_COMMUNITY)
Admission: AD | Admit: 2019-09-01 | Discharge: 2019-09-01 | Disposition: A | Discharge status: Home / Self Care | Payer: BLUE CROSS/BLUE SHIELD | Source: Intra-hospital | Attending: Psychiatry | Admitting: Psychiatry

## 2019-09-01 DIAGNOSIS — Z8489 Family history of other specified conditions: Secondary | ICD-10-CM | POA: Diagnosis not present

## 2019-09-01 DIAGNOSIS — Z811 Family history of alcohol abuse and dependence: Secondary | ICD-10-CM | POA: Insufficient documentation

## 2019-09-01 DIAGNOSIS — Z658 Other specified problems related to psychosocial circumstances: Secondary | ICD-10-CM | POA: Insufficient documentation

## 2019-09-01 DIAGNOSIS — F429 Obsessive-compulsive disorder, unspecified: Principal | ICD-10-CM

## 2019-09-01 DIAGNOSIS — Z818 Family history of other mental and behavioral disorders: Secondary | ICD-10-CM | POA: Insufficient documentation

## 2019-09-01 DIAGNOSIS — F332 Major depressive disorder, recurrent severe without psychotic features: Secondary | ICD-10-CM | POA: Diagnosis not present

## 2019-09-01 DIAGNOSIS — Z87891 Personal history of nicotine dependence: Secondary | ICD-10-CM | POA: Diagnosis not present

## 2019-09-01 DIAGNOSIS — G4700 Insomnia, unspecified: Secondary | ICD-10-CM | POA: Insufficient documentation

## 2019-09-01 DIAGNOSIS — F3189 Other bipolar disorder: Secondary | ICD-10-CM | POA: Insufficient documentation

## 2019-09-01 DIAGNOSIS — F422 Mixed obsessional thoughts and acts: Secondary | ICD-10-CM | POA: Diagnosis not present

## 2019-09-01 DIAGNOSIS — Z79899 Other long term (current) drug therapy: Secondary | ICD-10-CM | POA: Diagnosis not present

## 2019-09-01 DIAGNOSIS — F319 Bipolar disorder, unspecified: Secondary | ICD-10-CM

## 2019-09-01 DIAGNOSIS — I10 Essential (primary) hypertension: Secondary | ICD-10-CM | POA: Diagnosis not present

## 2019-09-01 DIAGNOSIS — F419 Anxiety disorder, unspecified: Secondary | ICD-10-CM | POA: Insufficient documentation

## 2019-09-01 MED ORDER — HYDROCHLOROTHIAZIDE 25 MG PO TABS
25.0000 mg | ORAL_TABLET | Freq: Every day | ORAL | Status: DC
  Administered 2019-09-01: 25 mg via ORAL
  Filled 2019-09-01: qty 1

## 2019-09-01 MED ORDER — LAMOTRIGINE 100 MG PO TABS
100.0000 mg | ORAL_TABLET | Freq: Every day | ORAL | Status: DC
  Administered 2019-09-01: 100 mg via ORAL
  Filled 2019-09-01: qty 1

## 2019-09-01 MED ORDER — CITALOPRAM HYDROBROMIDE 10 MG PO TABS: 10.0000 mg | ORAL_TABLET | Freq: Every day | ORAL | 0 refills | Status: DC

## 2019-09-01 MED ORDER — LISINOPRIL 20 MG PO TABS
40.0000 mg | ORAL_TABLET | Freq: Every day | ORAL | Status: DC
  Administered 2019-09-01: 40 mg via ORAL
  Filled 2019-09-01: qty 2

## 2019-09-01 MED ORDER — CITALOPRAM HYDROBROMIDE 10 MG PO TABS: 10.0000 mg | ORAL_TABLET | Freq: Every day | ORAL | Status: DC

## 2019-09-01 MED ORDER — FLUVOXAMINE MALEATE 50 MG PO TABS
50.0000 mg | ORAL_TABLET | Freq: Two times a day (BID) | ORAL | Status: DC
  Filled 2019-09-01 (×2): qty 1

## 2019-09-01 NOTE — Plan of Care (Signed)
[place]  Reason for Crisis Plan:  Crisis Stabilization   Plan of Care:  Referral for Inpatient Hospitalization  Family Support:      Current Living Environment:     Insurance:   Hospital Account    Name Acct ID Class Status Primary Coverage   Clinton Taylor, Clinton Taylor [id number] [place]        Guarantor Account (for Hospital Account [id number])    Name Relation to Pt Service Area Active? Acct Type   [name] Self [facility] Yes [facility]   Address Phone       [address] [place], [place] [address] [phone])          Coverage Information (for Hospital Account [id number])    1. GENERIC COMMERCIAL/GENERIC COMMERCIAL    F/O Payor/Plan Precert #   [facility] COMMERCIAL/GENERIC COMMERCIAL    Subscriber Subscriber #   Clinton Taylor, Clinton Taylor [id number]   [place] [address] [phone]       2. GENERIC [facility]/[facility]/GENERIC FIRST HE[employer]/COVENTRY    F/O Payor/Plan Precert #   [employer]/[facility]OV[facility]/GENERIC [facility] HE[employer]/COVENTRY    Subscriber Subscriber #   Clinton Taylor, Clinton Taylor [id number]   [place]   [address] [place] [address]           Legal Guardian:     Primary Care Provider:  [name], MD  Current Outpatient Providers:  [facility].  Psychiatrist:     Counselor/Therapist:     Compliant with Medications:  No  Additional Information:   Clinton Taylor 11/14/20206:56 AM
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
## 2019-09-01 NOTE — Progress Notes (Signed)
Patient has been cooperative in the milieu, frequently expressing need to be discharged. Reporting that he is motivated for outpatient services and denying thoughts of self harm.  Discharged to current residency per provider's recommendation. No sign of distress upon discharge.

## 2019-09-01 NOTE — Progress Notes (Signed)
Patient ID: Clinton Taylor, male   DOB: June 10, 1986, 33 y.o.   MRN: [id number] Pt was admitted to [facility] from the ED. Pt calm and cooperative with staff and assessment. Pt denies SI, HI, and AVH. Pt states that he has a Dx of OCD and Bipolar. No apparent distress noted or complaints voiced. Skin assessment completed. Will continue to monitor q 15 mins for safety.

## 2019-09-04 DIAGNOSIS — F422 Mixed obsessional thoughts and acts: Secondary | ICD-10-CM | POA: Diagnosis not present

## 2019-09-07 DIAGNOSIS — F422 Mixed obsessional thoughts and acts: Secondary | ICD-10-CM | POA: Diagnosis not present

## 2019-09-11 DIAGNOSIS — F411 Generalized anxiety disorder: Secondary | ICD-10-CM | POA: Diagnosis not present

## 2019-09-11 DIAGNOSIS — F3170 Bipolar disorder, currently in remission, most recent episode unspecified: Secondary | ICD-10-CM | POA: Diagnosis not present

## 2019-09-11 DIAGNOSIS — F422 Mixed obsessional thoughts and acts: Secondary | ICD-10-CM | POA: Diagnosis not present

## 2019-09-11 DIAGNOSIS — F4310 Post-traumatic stress disorder, unspecified: Secondary | ICD-10-CM | POA: Diagnosis not present

## 2019-09-14 DIAGNOSIS — F422 Mixed obsessional thoughts and acts: Secondary | ICD-10-CM | POA: Diagnosis not present

## 2019-09-21 DIAGNOSIS — F422 Mixed obsessional thoughts and acts: Secondary | ICD-10-CM | POA: Diagnosis not present

## 2019-10-05 DIAGNOSIS — F422 Mixed obsessional thoughts and acts: Secondary | ICD-10-CM | POA: Diagnosis not present

## 2019-10-09 DIAGNOSIS — F422 Mixed obsessional thoughts and acts: Secondary | ICD-10-CM | POA: Diagnosis not present

## 2019-10-19 DIAGNOSIS — F422 Mixed obsessional thoughts and acts: Secondary | ICD-10-CM | POA: Diagnosis not present

## 2019-10-23 DIAGNOSIS — F3170 Bipolar disorder, currently in remission, most recent episode unspecified: Secondary | ICD-10-CM | POA: Diagnosis not present

## 2019-10-23 DIAGNOSIS — F411 Generalized anxiety disorder: Secondary | ICD-10-CM | POA: Diagnosis not present

## 2019-10-23 DIAGNOSIS — F422 Mixed obsessional thoughts and acts: Secondary | ICD-10-CM | POA: Diagnosis not present

## 2019-10-23 DIAGNOSIS — F4310 Post-traumatic stress disorder, unspecified: Secondary | ICD-10-CM | POA: Diagnosis not present

## 2019-11-08 ENCOUNTER — Other Ambulatory Visit: Payer: Self-pay | Admitting: Emergency Medicine

## 2019-11-08 DIAGNOSIS — I10 Essential (primary) hypertension: Secondary | ICD-10-CM

## 2019-11-08 NOTE — Telephone Encounter (Signed)
Please schedule for an office visit for med refills

## 2019-11-08 NOTE — Telephone Encounter (Signed)
Requested medication (s) are due for refill today: no  Requested medication (s) are on the active medication list: yes  Last refill: 07/2019  Future visit scheduled: no  Notes to clinic:  no valid encounter within last 6 months Some medication were stopped at discharge Review for refill   Requested Prescriptions  Pending Prescriptions Disp Refills   amLODipine (NORVASC) 5 MG tablet [Pharmacy Med Name: AMLODIPINE BESYLATE 5MG  TABLETS] 90 tablet 0    Sig: TAKE 1 TABLET(5 MG) BY MOUTH DAILY      Cardiovascular:  Calcium Channel Blockers Failed - 11/08/2019  7:44 AM      Failed - Last BP in normal range    BP Readings from Last 1 Encounters:  08/31/19 119/73          Failed - Valid encounter within last 6 months    Recent Outpatient Visits           7 months ago Insomnia, unspecified type   Primary Care at [facility], [place], [place]   8 months ago Essential hypertension   Primary Care at [facility], [place], MD   1 year ago Snoring   Primary Care at [place], PA-C                cloNIDine (CATAPRES) 0.2 MG tablet [Pharmacy Med Name: CLONIDINE 0.2MG  TABLETS] 60 tablet 0    Sig: TAKE 1 TABLET(0.2 MG) BY MOUTH DAILY FOR HIGH BLOOD PRESSURE      Cardiovascular:  Alpha-2 Agonists Failed - 11/08/2019  7:44 AM      Failed - Last BP in normal range    BP Readings from Last 1 Encounters:  08/31/19 119/73          Failed - Valid encounter within last 6 months    Recent Outpatient Visits           7 months ago Insomnia, unspecified type   Primary Care at [facility], [place]es [place], MD   8 months ago Essential hypertension   Primary Care at [place], [place], [place]   1 year ago Snoring   Primary Care at [place], Audrie Lia, PA-C              Passed - Last Heart Rate in normal range    Pulse Readings from Last 1 Encounters:  08/31/19 79            lisinopril-hydrochlorothiazide (ZESTORETIC) 20-12.5 MG tablet  [Pharmacy Med Name: LISINOPRIL-HCTZ 20/12.5MG  TABLETS] 180 tablet 0    Sig: TAKE 2 TABLETS BY MOUTH DAILY      Cardiovascular:  ACEI + Diuretic Combos Failed - 11/08/2019  7:44 AM      Failed - Last BP in normal range    BP Readings from Last 1 Encounters:  08/31/19 119/73          Failed - Valid encounter within last 6 months    Recent Outpatient Visits           7 months ago Insomnia, unspecified type   [facility] at [facility], Ines [place], MD   8 months ago Essential hypertension   Primary Care at [place], [place], MD   1 year ago Snoring   Primary Care at [place], Audrie Lia, PA-C              Passed - Na in normal range and within 180 days    Sodium  Date Value Ref Range Status  08/31/2019 137 135 - 145 mmol/L Final  01/04/2018 139 134 - 144 mmol/L Final          Passed - K in normal range and within 180 days    Potassium  Date Value Ref Range Status  08/31/2019 4.2 3.5 - 5.1 mmol/L Final          Passed - Cr in normal range and within 180 days    Creatinine, Ser  Date Value Ref Range Status  08/31/2019 0.90 0.61 - 1.24 mg/dL Final          Passed - Ca in normal range and within 180 days    Calcium  Date Value Ref Range Status  08/31/2019 9.3 8.9 - 10.3 mg/dL Final          Passed - Patient is not pregnant

## 2019-11-09 DIAGNOSIS — F422 Mixed obsessional thoughts and acts: Secondary | ICD-10-CM | POA: Diagnosis not present

## 2019-11-13 NOTE — Telephone Encounter (Signed)
Called pt. Was unable to leave VM due to mail box not being set up

## 2019-11-23 DIAGNOSIS — F422 Mixed obsessional thoughts and acts: Secondary | ICD-10-CM | POA: Diagnosis not present

## 2019-11-26 ENCOUNTER — Other Ambulatory Visit: Payer: Self-pay | Admitting: Emergency Medicine

## 2019-11-26 DIAGNOSIS — I10 Essential (primary) hypertension: Secondary | ICD-10-CM

## 2019-11-26 MED ORDER — LISINOPRIL-HYDROCHLOROTHIAZIDE 20-12.5 MG PO TABS: 2.0000 | ORAL_TABLET | Freq: Every day | ORAL | 1 refills | Status: DC

## 2019-11-27 ENCOUNTER — Telehealth: Payer: Self-pay | Admitting: *Deleted

## 2019-11-27 NOTE — Telephone Encounter (Signed)
Faxed Rx request for Clonidine 0.2 mg to [facility], no add'l refill make an appt.  Confirmation page 1:23 pm.

## 2019-11-28 ENCOUNTER — Telehealth: Payer: Self-pay | Admitting: *Deleted

## 2019-12-04 DIAGNOSIS — F4310 Post-traumatic stress disorder, unspecified: Secondary | ICD-10-CM | POA: Diagnosis not present

## 2019-12-04 DIAGNOSIS — F411 Generalized anxiety disorder: Secondary | ICD-10-CM | POA: Diagnosis not present

## 2019-12-04 DIAGNOSIS — F3170 Bipolar disorder, currently in remission, most recent episode unspecified: Secondary | ICD-10-CM | POA: Diagnosis not present

## 2019-12-04 NOTE — Telephone Encounter (Signed)
error 

## 2019-12-04 NOTE — Telephone Encounter (Signed)
Entered in error

## 2019-12-07 DIAGNOSIS — F422 Mixed obsessional thoughts and acts: Secondary | ICD-10-CM | POA: Diagnosis not present

## 2019-12-20 DIAGNOSIS — G4733 Obstructive sleep apnea (adult) (pediatric): Secondary | ICD-10-CM | POA: Diagnosis not present

## 2019-12-28 DIAGNOSIS — F422 Mixed obsessional thoughts and acts: Secondary | ICD-10-CM | POA: Diagnosis not present

## 2020-01-01 DIAGNOSIS — F411 Generalized anxiety disorder: Secondary | ICD-10-CM | POA: Diagnosis not present

## 2020-01-18 DIAGNOSIS — F422 Mixed obsessional thoughts and acts: Secondary | ICD-10-CM | POA: Diagnosis not present

## 2020-01-29 DIAGNOSIS — F429 Obsessive-compulsive disorder, unspecified: Secondary | ICD-10-CM | POA: Diagnosis not present

## 2020-02-01 DIAGNOSIS — F422 Mixed obsessional thoughts and acts: Secondary | ICD-10-CM | POA: Diagnosis not present

## 2020-02-15 DIAGNOSIS — F422 Mixed obsessional thoughts and acts: Secondary | ICD-10-CM | POA: Diagnosis not present

## 2020-02-27 DIAGNOSIS — Z6841 Body Mass Index (BMI) 40.0 and over, adult: Secondary | ICD-10-CM | POA: Diagnosis not present

## 2020-02-27 DIAGNOSIS — E291 Testicular hypofunction: Secondary | ICD-10-CM | POA: Diagnosis not present

## 2020-02-27 DIAGNOSIS — I10 Essential (primary) hypertension: Secondary | ICD-10-CM | POA: Diagnosis not present

## 2020-02-27 DIAGNOSIS — E7800 Pure hypercholesterolemia, unspecified: Secondary | ICD-10-CM | POA: Diagnosis not present

## 2020-02-27 DIAGNOSIS — R5382 Chronic fatigue, unspecified: Secondary | ICD-10-CM | POA: Diagnosis not present

## 2020-02-28 DIAGNOSIS — F422 Mixed obsessional thoughts and acts: Secondary | ICD-10-CM | POA: Diagnosis not present

## 2020-03-03 DIAGNOSIS — E291 Testicular hypofunction: Secondary | ICD-10-CM | POA: Diagnosis not present

## 2020-03-03 DIAGNOSIS — R5382 Chronic fatigue, unspecified: Secondary | ICD-10-CM | POA: Diagnosis not present

## 2020-03-03 DIAGNOSIS — R6882 Decreased libido: Secondary | ICD-10-CM | POA: Diagnosis not present

## 2020-03-03 DIAGNOSIS — M2559 Pain in other specified joint: Secondary | ICD-10-CM | POA: Diagnosis not present

## 2020-03-03 DIAGNOSIS — Z1339 Encounter for screening examination for other mental health and behavioral disorders: Secondary | ICD-10-CM | POA: Diagnosis not present

## 2020-03-03 DIAGNOSIS — Z1331 Encounter for screening for depression: Secondary | ICD-10-CM | POA: Diagnosis not present

## 2020-03-07 DIAGNOSIS — I10 Essential (primary) hypertension: Secondary | ICD-10-CM | POA: Diagnosis not present

## 2020-03-07 DIAGNOSIS — Z6841 Body Mass Index (BMI) 40.0 and over, adult: Secondary | ICD-10-CM | POA: Diagnosis not present

## 2020-03-19 DIAGNOSIS — E7800 Pure hypercholesterolemia, unspecified: Secondary | ICD-10-CM | POA: Diagnosis not present

## 2020-03-19 DIAGNOSIS — Z6841 Body Mass Index (BMI) 40.0 and over, adult: Secondary | ICD-10-CM | POA: Diagnosis not present

## 2020-03-20 DIAGNOSIS — F422 Mixed obsessional thoughts and acts: Secondary | ICD-10-CM | POA: Diagnosis not present

## 2020-03-25 DIAGNOSIS — F4310 Post-traumatic stress disorder, unspecified: Secondary | ICD-10-CM | POA: Diagnosis not present

## 2020-03-25 DIAGNOSIS — F319 Bipolar disorder, unspecified: Secondary | ICD-10-CM | POA: Diagnosis not present

## 2020-03-25 DIAGNOSIS — F411 Generalized anxiety disorder: Secondary | ICD-10-CM | POA: Diagnosis not present

## 2020-03-25 DIAGNOSIS — F429 Obsessive-compulsive disorder, unspecified: Secondary | ICD-10-CM | POA: Diagnosis not present

## 2020-03-27 DIAGNOSIS — G4733 Obstructive sleep apnea (adult) (pediatric): Secondary | ICD-10-CM | POA: Diagnosis not present

## 2020-03-28 DIAGNOSIS — F422 Mixed obsessional thoughts and acts: Secondary | ICD-10-CM | POA: Diagnosis not present

## 2020-03-28 DIAGNOSIS — G4733 Obstructive sleep apnea (adult) (pediatric): Secondary | ICD-10-CM | POA: Diagnosis not present

## 2020-03-31 DIAGNOSIS — Z6841 Body Mass Index (BMI) 40.0 and over, adult: Secondary | ICD-10-CM | POA: Diagnosis not present

## 2020-03-31 DIAGNOSIS — G4733 Obstructive sleep apnea (adult) (pediatric): Secondary | ICD-10-CM | POA: Diagnosis not present

## 2020-03-31 DIAGNOSIS — E291 Testicular hypofunction: Secondary | ICD-10-CM | POA: Diagnosis not present

## 2020-04-03 DIAGNOSIS — F422 Mixed obsessional thoughts and acts: Secondary | ICD-10-CM | POA: Diagnosis not present

## 2020-04-11 DIAGNOSIS — F422 Mixed obsessional thoughts and acts: Secondary | ICD-10-CM | POA: Diagnosis not present

## 2020-04-22 DIAGNOSIS — F319 Bipolar disorder, unspecified: Secondary | ICD-10-CM | POA: Diagnosis not present

## 2020-04-22 DIAGNOSIS — F4310 Post-traumatic stress disorder, unspecified: Secondary | ICD-10-CM | POA: Diagnosis not present

## 2020-04-22 DIAGNOSIS — F411 Generalized anxiety disorder: Secondary | ICD-10-CM | POA: Diagnosis not present

## 2020-04-22 DIAGNOSIS — F429 Obsessive-compulsive disorder, unspecified: Secondary | ICD-10-CM | POA: Diagnosis not present

## 2020-04-25 DIAGNOSIS — F422 Mixed obsessional thoughts and acts: Secondary | ICD-10-CM | POA: Diagnosis not present

## 2020-05-01 DIAGNOSIS — F429 Obsessive-compulsive disorder, unspecified: Secondary | ICD-10-CM | POA: Diagnosis not present

## 2020-05-01 DIAGNOSIS — F4310 Post-traumatic stress disorder, unspecified: Secondary | ICD-10-CM | POA: Diagnosis not present

## 2020-05-22 DIAGNOSIS — Z20822 Contact with and (suspected) exposure to covid-19: Secondary | ICD-10-CM | POA: Diagnosis not present

## 2020-05-23 DIAGNOSIS — F422 Mixed obsessional thoughts and acts: Secondary | ICD-10-CM | POA: Diagnosis not present

## 2020-05-30 DIAGNOSIS — F422 Mixed obsessional thoughts and acts: Secondary | ICD-10-CM | POA: Diagnosis not present

## 2020-06-06 DIAGNOSIS — F422 Mixed obsessional thoughts and acts: Secondary | ICD-10-CM | POA: Diagnosis not present

## 2020-06-13 ENCOUNTER — Telehealth: Payer: Self-pay | Admitting: Emergency Medicine

## 2020-06-13 ENCOUNTER — Other Ambulatory Visit: Payer: Self-pay | Admitting: Emergency Medicine

## 2020-06-13 DIAGNOSIS — F422 Mixed obsessional thoughts and acts: Secondary | ICD-10-CM | POA: Diagnosis not present

## 2020-06-13 DIAGNOSIS — I10 Essential (primary) hypertension: Secondary | ICD-10-CM

## 2020-06-13 MED ORDER — LISINOPRIL-HYDROCHLOROTHIAZIDE 20-12.5 MG PO TABS: 2.0000 | ORAL_TABLET | Freq: Every day | ORAL | 0 refills | Status: DC

## 2020-06-13 NOTE — Telephone Encounter (Signed)
Curtesy refill was already given earlier today

## 2020-06-13 NOTE — Telephone Encounter (Signed)
Unable to leave message on either pt. Phones. Sent message to pharmacy pt. Needs appointment. Courtesy refill.

## 2020-06-13 NOTE — Telephone Encounter (Signed)
Pt called and made a med refill appt for 06/17/20 but is needing a courtesy refill put in until that appt. Please advise  lisinopril-hydrochlorothiazide (ZESTORETIC) 20-12.5 MG tablet [[id number]]

## 2020-06-13 NOTE — Telephone Encounter (Signed)
Medication Refill - Medication:  lisinopril-hydrochlorothiazide (ZESTORETIC) 20-12.5 MG tablet    Has the patient contacted their pharmacy? No. (Agent: If no, request that the patient contact the pharmacy for the refill.) (Agent: If yes, when and what did the pharmacy advise?)  Preferred Pharmacy (with phone number or street name): [facility] in [place]   Agent: Please be advised that RX refills may take up to 3 business days. We ask that you follow-up with your pharmacy.

## 2020-06-17 ENCOUNTER — Ambulatory Visit (INDEPENDENT_AMBULATORY_CARE_PROVIDER_SITE_OTHER): Payer: BLUE CROSS/BLUE SHIELD | Admitting: Emergency Medicine

## 2020-06-17 ENCOUNTER — Other Ambulatory Visit: Payer: Self-pay

## 2020-06-17 ENCOUNTER — Encounter: Payer: Self-pay | Admitting: Emergency Medicine

## 2020-06-17 VITALS — BP 148/96 | HR 92 | Temp 98.0°F | Resp 16 | Ht 72.0 in | Wt 341.0 lb

## 2020-06-17 DIAGNOSIS — Z6841 Body Mass Index (BMI) 40.0 and over, adult: Secondary | ICD-10-CM

## 2020-06-17 DIAGNOSIS — F39 Unspecified mood [affective] disorder: Secondary | ICD-10-CM

## 2020-06-17 DIAGNOSIS — F319 Bipolar disorder, unspecified: Secondary | ICD-10-CM

## 2020-06-17 DIAGNOSIS — I10 Essential (primary) hypertension: Secondary | ICD-10-CM

## 2020-06-17 DIAGNOSIS — F422 Mixed obsessional thoughts and acts: Secondary | ICD-10-CM

## 2020-06-17 DIAGNOSIS — G4733 Obstructive sleep apnea (adult) (pediatric): Secondary | ICD-10-CM

## 2020-06-17 DIAGNOSIS — Z23 Encounter for immunization: Secondary | ICD-10-CM | POA: Diagnosis not present

## 2020-06-17 DIAGNOSIS — Z9989 Dependence on other enabling machines and devices: Secondary | ICD-10-CM

## 2020-06-17 MED ORDER — LOSARTAN POTASSIUM-HCTZ 100-12.5 MG PO TABS: 1.0000 | ORAL_TABLET | Freq: Every day | ORAL | 3 refills | Status: DC

## 2020-06-17 NOTE — Progress Notes (Signed)
Clinton Taylor 33 y.o.   Chief Complaint  Patient presents with  . Medication Refill    Lisinopril-hctz    HISTORY OF PRESENT ILLNESS: This is a 34 y.o. male with history of hypertension presently on lisinopril-hydrochlorothiazide, here for follow-up and medication refill. First office visit with me. BP Readings from Last 3 Encounters:  06/17/20 (!) 148/96  08/31/19 119/73  12/06/18 (!) 175/97  Also has psychiatric history including OCD, depression, bipolar disorder, and mood disorder, on multiple medications.  Sees psychiatrist on a regular basis. No other complaints or medical concerns today.  HPI   Prior to Admission medications   Medication Sig Start Date End Date Taking? Authorizing Provider  fluvoxaMINE (LUVOX) 100 MG tablet Take 100 mg by mouth at bedtime.   Yes [provider]  lamoTRIgine (LAMICTAL) 100 MG tablet Take 100 mg by mouth daily. 08/26/19  Yes [provider]  lisinopril-hydrochlorothiazide (ZESTORETIC) 20-12.5 MG tablet Take 2 tablets by mouth daily. 06/13/20 09/11/20 Yes Ozil Stettler, Eilleen Kempf, MD  citalopram (CELEXA) 10 MG tablet Take 1 tablet (10 mg total) by mouth daily. Patient not taking: Reported on 06/17/2020 09/02/19   Chales Abrahams, NP  triamcinolone cream (KENALOG) 0.1 % Apply topically 2 (two) times daily. For skin rashes Patient not taking: Reported on 06/17/2020 02/01/14   Sanjuana Kava, NP    No Known Allergies  Patient Active Problem List   Diagnosis Date Noted  . Obsessive compulsive disorder 09/01/2019  . MDD (major depressive disorder), recurrent episode, severe (HCC) 08/31/2019  . Obstructive sleep apnea treated with continuous positive airway pressure (CPAP) 12/06/2018  . Hypertension 01/05/2018  . Mood disorder (HCC) 01/05/2018  . Class 3 severe obesity due to excess calories with serious comorbidity and body mass index (BMI) of 45.0 to 49.9 in adult (HCC) 01/05/2018  . Prediabetes 01/05/2018  . Bipolar I  disorder (HCC) 01/29/2014    Past Medical History:  Diagnosis Date  . Anger   . Anxiety   . Depression   . Hiatal hernia   . Hypertension   . Mood changes   . Obsessive compulsive disorder     History reviewed. No pertinent surgical history.  Social History   Socioeconomic History  . Marital status: Married    Spouse name: Not on file  . Number of children: Not on file  . Years of education: Not on file  . Highest education level: Not on file  Occupational History  . Not on file  Tobacco Use  . Smoking status: Former Smoker    Packs/day: 1.00    Types: Cigarettes  . Smokeless tobacco: Former Neurosurgeon    Types: Engineer, drilling  . Vaping Use: Every day  Substance and Sexual Activity  . Alcohol use: No    Comment: socially  . Drug use: No    Types: Marijuana  . Sexual activity: Yes  Other Topics Concern  . Not on file  Social History Narrative  . Not on file   Social Determinants of Health   Financial Resource Strain:   . Difficulty of Paying Living Expenses: Not on file  Food Insecurity:   . Worried About Programme researcher, broadcasting/film/video in the Last Year: Not on file  . Ran Out of Food in the Last Year: Not on file  Transportation Needs:   . Lack of Transportation (Medical): Not on file  . Lack of Transportation (Non-Medical): Not on file  Physical Activity:   . Days of Exercise per Week: Not  on file  . Minutes of Exercise per Session: Not on file  Stress:   . Feeling of Stress : Not on file  Social Connections:   . Frequency of Communication with Friends and Family: Not on file  . Frequency of Social Gatherings with Friends and Family: Not on file  . Attends Religious Services: Not on file  . Active Member of Clubs or Organizations: Not on file  . Attends BankerClub or Organization Meetings: Not on file  . Marital Status: Not on file  Intimate Partner Violence:   . Fear of Current or Ex-Partner: Not on file  . Emotionally Abused: Not on file  . Physically Abused: Not on  file  . Sexually Abused: Not on file    Family History  Problem Relation Age of Onset  . OCD Mother   . Alcohol abuse Father   . Bipolar disorder Father   . Drug abuse Father   . Alcohol abuse Maternal Uncle   . Alcohol abuse Paternal Uncle   . OCD Maternal Grandfather   . Alcohol abuse Paternal Grandmother   . Drug abuse Paternal Grandmother      Review of Systems  Constitutional: Negative.  Negative for chills and fever.  HENT: Negative.  Negative for congestion and sore throat.   Respiratory: Negative.  Negative for cough and shortness of breath.   Cardiovascular: Negative.  Negative for chest pain and palpitations.  Gastrointestinal: Negative for abdominal pain, diarrhea, nausea and vomiting.  Genitourinary: Negative.   Musculoskeletal: Negative.   Skin: Negative.  Negative for rash.  Neurological: Negative.  Negative for dizziness and headaches.  All other systems reviewed and are negative.  Today's Vitals   06/17/20 1633  BP: (!) 148/96  Pulse: 92  Resp: 16  Temp: 98 F (36.7 C)  TempSrc: Temporal  SpO2: 94%  Weight: (!) 341 lb (154.7 kg)  Height: 6' (1.829 m)   Body mass index is 46.25 kg/m.   Physical Exam Vitals reviewed.  Constitutional:      Appearance: He is obese.  HENT:     Head: Normocephalic.  Eyes:     Extraocular Movements: Extraocular movements intact.     Pupils: Pupils are equal, round, and reactive to light.  Cardiovascular:     Rate and Rhythm: Normal rate and regular rhythm.     Pulses: Normal pulses.     Heart sounds: Normal heart sounds.  Pulmonary:     Effort: Pulmonary effort is normal.     Breath sounds: Normal breath sounds.  Musculoskeletal:        General: Normal range of motion.     Cervical back: Normal range of motion and neck supple.  Skin:    General: Skin is warm and dry.     Capillary Refill: Capillary refill takes less than 2 seconds.  Neurological:     General: No focal deficit present.     Mental Status:  He is alert and oriented to person, place, and time.  Psychiatric:        Mood and Affect: Mood normal.        Behavior: Behavior normal.    A total of 30 minutes was spent with the patient, greater than 50% of which was in counseling/coordination of care regarding hypertension and cardiovascular risks associated with this condition, review of all medications, review of new medication losartan-hydrochlorothiazide, review of most recent blood work results, review of most recent office visit notes, diet and nutrition, prognosis and need for follow-up.  ASSESSMENT & PLAN: Hypertension Uncontrolled hypertension.  Will change lisinopril-hydrochlorothiazide to losartan-hydrochlorothiazide 100-12.5 mg daily.  Advised to monitor blood pressure readings at home.  Diet and nutrition discussed Follow-up in 6 months.  Darroll was seen today for medication refill.  Diagnoses and all orders for this visit:  Essential hypertension -     losartan-hydrochlorothiazide (HYZAAR) 100-12.5 MG tablet; Take 1 tablet by mouth daily.  Bipolar I disorder (HCC)  Morbid obesity with BMI of 45.0-49.9, adult (HCC)  Body mass index (BMI) of 45.0-49.9 in adult (HCC)  Mood disorder (HCC)  Obstructive sleep apnea treated with continuous positive airway pressure (CPAP)  Mixed obsessional thoughts and acts  Need for prophylactic vaccination and inoculation against influenza -     Flu Vaccine QUAD 36+ mos IM  Need for diphtheria-tetanus-pertussis (Tdap) vaccine -     Tdap vaccine greater than or equal to 7yo IM    Patient Instructions    Stop Zestoretic and start losartan-hydrochlorothiazide as prescribed. Hypertension, Adult High blood pressure (hypertension) is when the force of blood pumping through the arteries is too strong. The arteries are the blood vessels that carry blood from the heart throughout the body. Hypertension forces the heart to work harder to pump blood and may cause arteries to become  narrow or stiff. Untreated or uncontrolled hypertension can cause a heart attack, heart failure, a stroke, kidney disease, and other problems. A blood pressure reading consists of a higher number over a lower number. Ideally, your blood pressure should be below 120/80. The first ("top") number is called the systolic pressure. It is a measure of the pressure in your arteries as your heart beats. The second ("bottom") number is called the diastolic pressure. It is a measure of the pressure in your arteries as the heart relaxes. What are the causes? The exact cause of this condition is not known. There are some conditions that result in or are related to high blood pressure. What increases the risk? Some risk factors for high blood pressure are under your control. The following factors may make you more likely to develop this condition:  Smoking.  Having type 2 diabetes mellitus, high cholesterol, or both.  Not getting enough exercise or physical activity.  Being overweight.  Having too much fat, sugar, calories, or salt (sodium) in your diet.  Drinking too much alcohol. Some risk factors for high blood pressure may be difficult or impossible to change. Some of these factors include:  Having chronic kidney disease.  Having a family history of high blood pressure.  Age. Risk increases with age.  Race. You may be at higher risk if you are African American.  Gender. Men are at higher risk than women before age 26. After age 79, women are at higher risk than men.  Having obstructive sleep apnea.  Stress. What are the signs or symptoms? High blood pressure may not cause symptoms. Very high blood pressure (hypertensive crisis) may cause:  Headache.  Anxiety.  Shortness of breath.  Nosebleed.  Nausea and vomiting.  Vision changes.  Severe chest pain.  Seizures. How is this diagnosed? This condition is diagnosed by measuring your blood pressure while you are seated, with your  arm resting on a flat surface, your legs uncrossed, and your feet flat on the floor. The cuff of the blood pressure monitor will be placed directly against the skin of your upper arm at the level of your heart. It should be measured at least twice using the same arm. Certain conditions  can cause a difference in blood pressure between your right and left arms. Certain factors can cause blood pressure readings to be lower or higher than normal for a short period of time:  When your blood pressure is higher when you are in a health care provider's office than when you are at home, this is called white coat hypertension. Most people with this condition do not need medicines.  When your blood pressure is higher at home than when you are in a health care provider's office, this is called masked hypertension. Most people with this condition may need medicines to control blood pressure. If you have a high blood pressure reading during one visit or you have normal blood pressure with other risk factors, you may be asked to:  Return on a different day to have your blood pressure checked again.  Monitor your blood pressure at home for 1 week or longer. If you are diagnosed with hypertension, you may have other blood or imaging tests to help your health care provider understand your overall risk for other conditions. How is this treated? This condition is treated by making healthy lifestyle changes, such as eating healthy foods, exercising more, and reducing your alcohol intake. Your health care provider may prescribe medicine if lifestyle changes are not enough to get your blood pressure under control, and if:  Your systolic blood pressure is above 130.  Your diastolic blood pressure is above 80. Your personal target blood pressure may vary depending on your medical conditions, your age, and other factors. Follow these instructions at home: Eating and drinking   Eat a diet that is high in fiber and  potassium, and low in sodium, added sugar, and fat. An example eating plan is called the DASH (Dietary Approaches to Stop Hypertension) diet. To eat this way: ? Eat plenty of fresh fruits and vegetables. Try to fill one half of your plate at each meal with fruits and vegetables. ? Eat whole grains, such as whole-wheat pasta, brown rice, or whole-grain bread. Fill about one fourth of your plate with whole grains. ? Eat or drink low-fat dairy products, such as skim milk or low-fat yogurt. ? Avoid fatty cuts of meat, processed or cured meats, and poultry with skin. Fill about one fourth of your plate with lean proteins, such as fish, chicken without skin, beans, eggs, or tofu. ? Avoid pre-made and processed foods. These tend to be higher in sodium, added sugar, and fat.  Reduce your daily sodium intake. Most people with hypertension should eat less than 1,500 mg of sodium a day.  Do not drink alcohol if: ? Your health care provider tells you not to drink. ? You are pregnant, may be pregnant, or are planning to become pregnant.  If you drink alcohol: ? Limit how much you use to:  0-1 drink a day for women.  0-2 drinks a day for men. ? Be aware of how much alcohol is in your drink. In the [place] one drink equals one 12 oz bottle of beer (355 mL), one 5 oz glass of wine (148 mL), or one 1 oz glass of hard liquor (44 mL). Lifestyle   Work with your health care provider to maintain a healthy body weight or to lose weight. Ask what an ideal weight is for you.  Get at least 30 minutes of exercise most days of the week. Activities may include walking, swimming, or biking.  Include exercise to strengthen your muscles (resistance exercise), such as Pilates or  lifting weights, as part of your weekly exercise routine. Try to do these types of exercises for 30 minutes at least 3 days a week.  Do not use any products that contain nicotine or tobacco, such as cigarettes, e-cigarettes, and chewing tobacco.  If you need help quitting, ask your health care provider.  Monitor your blood pressure at home as told by your health care provider.  Keep all follow-up visits as told by your health care provider. This is important. Medicines  Take over-the-counter and prescription medicines only as told by your health care provider. Follow directions carefully. Blood pressure medicines must be taken as prescribed.  Do not skip doses of blood pressure medicine. Doing this puts you at risk for problems and can make the medicine less effective.  Ask your health care provider about side effects or reactions to medicines that you should watch for. Contact a health care provider if you:  Think you are having a reaction to a medicine you are taking.  Have headaches that keep coming back (recurring).  Feel dizzy.  Have swelling in your ankles.  Have trouble with your vision. Get help right away if you:  Develop a severe headache or confusion.  Have unusual weakness or numbness.  Feel faint.  Have severe pain in your chest or abdomen.  Vomit repeatedly.  Have trouble breathing. Summary  Hypertension is when the force of blood pumping through your arteries is too strong. If this condition is not controlled, it may put you at risk for serious complications.  Your personal target blood pressure may vary depending on your medical conditions, your age, and other factors. For most people, a normal blood pressure is less than 120/80.  Hypertension is treated with lifestyle changes, medicines, or a combination of both. Lifestyle changes include losing weight, eating a healthy, low-sodium diet, exercising more, and limiting alcohol. This information is not intended to replace advice given to you by your health care provider. Make sure you discuss any questions you have with your health care provider. Document Revised: 06/14/2018 Document Reviewed: 06/14/2018 Elsevier Patient Education  [employer].    If you have lab work done today you will be contacted with your lab results within the next 2 weeks.  If you have not heard from [place] then please contact us. The fastest way to get your results is to register for My Chart.   IF you received an x-ray today, you will receive an invoice from [facility]. Please contact [facility] at [phone] with questions or concerns regarding your invoice.   IF you received labwork today, you will receive an invoice from [facility]. Please contact [facility] at [phone] with questions or concerns regarding your invoice.   Our billing staff will not be able to assist you with questions regarding bills from these companies.  You will be contacted with the lab results as soon as they are available. The fastest way to get your results is to activate your My Chart account. Instructions are located on the last page of this paperwork. If you have not heard from [place] regarding the results in 2 weeks, please contact this office.         Edwina Barth, MD [facility]

## 2020-06-17 NOTE — Assessment & Plan Note (Signed)
Uncontrolled hypertension.  Will change lisinopril-hydrochlorothiazide to losartan-hydrochlorothiazide 100-12.5 mg daily.  Advised to monitor blood pressure readings at home.  Diet and nutrition discussed Follow-up in 6 months.

## 2020-06-17 NOTE — Patient Instructions (Addendum)
Stop Zestoretic and start losartan-hydrochlorothiazide as prescribed. Hypertension, Adult High blood pressure (hypertension) is when the force of blood pumping through the arteries is too strong. The arteries are the blood vessels that carry blood from the heart throughout the body. Hypertension forces the heart to work harder to pump blood and may cause arteries to become narrow or stiff. Untreated or uncontrolled hypertension can cause a heart attack, heart failure, a stroke, kidney disease, and other problems. A blood pressure reading consists of a higher number over a lower number. Ideally, your blood pressure should be below 120/80. The first ("top") number is called the systolic pressure. It is a measure of the pressure in your arteries as your heart beats. The second ("bottom") number is called the diastolic pressure. It is a measure of the pressure in your arteries as the heart relaxes. What are the causes? The exact cause of this condition is not known. There are some conditions that result in or are related to high blood pressure. What increases the risk? Some risk factors for high blood pressure are under your control. The following factors may make you more likely to develop this condition:  Smoking.  Having type 2 diabetes mellitus, high cholesterol, or both.  Not getting enough exercise or physical activity.  Being overweight.  Having too much fat, sugar, calories, or salt (sodium) in your diet.  Drinking too much alcohol. Some risk factors for high blood pressure may be difficult or impossible to change. Some of these factors include:  Having chronic kidney disease.  Having a family history of high blood pressure.  Age. Risk increases with age.  Race. You may be at higher risk if you are African American.  Gender. Men are at higher risk than women before age 24. After age 45, women are at higher risk than men.  Having obstructive sleep apnea.  Stress. What are the  signs or symptoms? High blood pressure may not cause symptoms. Very high blood pressure (hypertensive crisis) may cause:  Headache.  Anxiety.  Shortness of breath.  Nosebleed.  Nausea and vomiting.  Vision changes.  Severe chest pain.  Seizures. How is this diagnosed? This condition is diagnosed by measuring your blood pressure while you are seated, with your arm resting on a flat surface, your legs uncrossed, and your feet flat on the floor. The cuff of the blood pressure monitor will be placed directly against the skin of your upper arm at the level of your heart. It should be measured at least twice using the same arm. Certain conditions can cause a difference in blood pressure between your right and left arms. Certain factors can cause blood pressure readings to be lower or higher than normal for a short period of time:  When your blood pressure is higher when you are in a health care provider's office than when you are at home, this is called white coat hypertension. Most people with this condition do not need medicines.  When your blood pressure is higher at home than when you are in a health care provider's office, this is called masked hypertension. Most people with this condition may need medicines to control blood pressure. If you have a high blood pressure reading during one visit or you have normal blood pressure with other risk factors, you may be asked to:  Return on a different day to have your blood pressure checked again.  Monitor your blood pressure at home for 1 week or longer. If you are diagnosed with  hypertension, you may have other blood or imaging tests to help your health care provider understand your overall risk for other conditions. How is this treated? This condition is treated by making healthy lifestyle changes, such as eating healthy foods, exercising more, and reducing your alcohol intake. Your health care provider may prescribe medicine if lifestyle  changes are not enough to get your blood pressure under control, and if:  Your systolic blood pressure is above 130.  Your diastolic blood pressure is above 80. Your personal target blood pressure may vary depending on your medical conditions, your age, and other factors. Follow these instructions at home: Eating and drinking   Eat a diet that is high in fiber and potassium, and low in sodium, added sugar, and fat. An example eating plan is called the DASH (Dietary Approaches to Stop Hypertension) diet. To eat this way: ? Eat plenty of fresh fruits and vegetables. Try to fill one half of your plate at each meal with fruits and vegetables. ? Eat whole grains, such as whole-wheat pasta, brown rice, or whole-grain bread. Fill about one fourth of your plate with whole grains. ? Eat or drink low-fat dairy products, such as skim milk or low-fat yogurt. ? Avoid fatty cuts of meat, processed or cured meats, and poultry with skin. Fill about one fourth of your plate with lean proteins, such as fish, chicken without skin, beans, eggs, or tofu. ? Avoid pre-made and processed foods. These tend to be higher in sodium, added sugar, and fat.  Reduce your daily sodium intake. Most people with hypertension should eat less than 1,500 mg of sodium a day.  Do not drink alcohol if: ? Your health care provider tells you not to drink. ? You are pregnant, may be pregnant, or are planning to become pregnant.  If you drink alcohol: ? Limit how much you use to:  0-1 drink a day for women.  0-2 drinks a day for men. ? Be aware of how much alcohol is in your drink. In the [place] one drink equals one 12 oz bottle of beer (355 mL), one 5 oz glass of wine (148 mL), or one 1 oz glass of hard liquor (44 mL). Lifestyle   Work with your health care provider to maintain a healthy body weight or to lose weight. Ask what an ideal weight is for you.  Get at least 30 minutes of exercise most days of the week. Activities  may include walking, swimming, or biking.  Include exercise to strengthen your muscles (resistance exercise), such as Pilates or lifting weights, as part of your weekly exercise routine. Try to do these types of exercises for 30 minutes at least 3 days a week.  Do not use any products that contain nicotine or tobacco, such as cigarettes, e-cigarettes, and chewing tobacco. If you need help quitting, ask your health care provider.  Monitor your blood pressure at home as told by your health care provider.  Keep all follow-up visits as told by your health care provider. This is important. Medicines  Take over-the-counter and prescription medicines only as told by your health care provider. Follow directions carefully. Blood pressure medicines must be taken as prescribed.  Do not skip doses of blood pressure medicine. Doing this puts you at risk for problems and can make the medicine less effective.  Ask your health care provider about side effects or reactions to medicines that you should watch for. Contact a health care provider if you:  Think you are  having a reaction to a medicine you are taking.  Have headaches that keep coming back (recurring).  Feel dizzy.  Have swelling in your ankles.  Have trouble with your vision. Get help right away if you:  Develop a severe headache or confusion.  Have unusual weakness or numbness.  Feel faint.  Have severe pain in your chest or abdomen.  Vomit repeatedly.  Have trouble breathing. Summary  Hypertension is when the force of blood pumping through your arteries is too strong. If this condition is not controlled, it may put you at risk for serious complications.  Your personal target blood pressure may vary depending on your medical conditions, your age, and other factors. For most people, a normal blood pressure is less than 120/80.  Hypertension is treated with lifestyle changes, medicines, or a combination of both. Lifestyle  changes include losing weight, eating a healthy, low-sodium diet, exercising more, and limiting alcohol. This information is not intended to replace advice given to you by your health care provider. Make sure you discuss any questions you have with your health care provider. Document Revised: 06/14/2018 Document Reviewed: 06/14/2018 Elsevier Patient Education  [employer].    If you have lab work done today you will be contacted with your lab results within the next 2 weeks.  If you have not heard from [place] then please contact us. The fastest way to get your results is to register for My Chart.   IF you received an x-ray today, you will receive an invoice from [facility]. Please contact [facility] at [phone] with questions or concerns regarding your invoice.   IF you received labwork today, you will receive an invoice from [facility]. Please contact [facility] at [phone] with questions or concerns regarding your invoice.   Our billing staff will not be able to assist you with questions regarding bills from these companies.  You will be contacted with the lab results as soon as they are available. The fastest way to get your results is to activate your My Chart account. Instructions are located on the last page of this paperwork. If you have not heard from [place] regarding the results in 2 weeks, please contact this office.

## 2020-06-20 DIAGNOSIS — F422 Mixed obsessional thoughts and acts: Secondary | ICD-10-CM | POA: Diagnosis not present

## 2020-06-27 DIAGNOSIS — F422 Mixed obsessional thoughts and acts: Secondary | ICD-10-CM | POA: Diagnosis not present

## 2020-06-29 DIAGNOSIS — Z20822 Contact with and (suspected) exposure to covid-19: Secondary | ICD-10-CM | POA: Diagnosis not present

## 2020-07-04 DIAGNOSIS — H52223 Regular astigmatism, bilateral: Secondary | ICD-10-CM | POA: Diagnosis not present

## 2020-07-04 DIAGNOSIS — F422 Mixed obsessional thoughts and acts: Secondary | ICD-10-CM | POA: Diagnosis not present

## 2020-07-11 DIAGNOSIS — F422 Mixed obsessional thoughts and acts: Secondary | ICD-10-CM | POA: Diagnosis not present

## 2020-07-25 DIAGNOSIS — F422 Mixed obsessional thoughts and acts: Secondary | ICD-10-CM | POA: Diagnosis not present

## 2020-08-01 DIAGNOSIS — F422 Mixed obsessional thoughts and acts: Secondary | ICD-10-CM | POA: Diagnosis not present

## 2020-08-29 DIAGNOSIS — F422 Mixed obsessional thoughts and acts: Secondary | ICD-10-CM | POA: Diagnosis not present

## 2020-09-05 DIAGNOSIS — F422 Mixed obsessional thoughts and acts: Secondary | ICD-10-CM | POA: Diagnosis not present

## 2020-09-12 DIAGNOSIS — F422 Mixed obsessional thoughts and acts: Secondary | ICD-10-CM | POA: Diagnosis not present

## 2020-09-20 ENCOUNTER — Other Ambulatory Visit: Payer: Self-pay | Admitting: Emergency Medicine

## 2020-09-20 DIAGNOSIS — I10 Essential (primary) hypertension: Secondary | ICD-10-CM

## 2020-10-02 DIAGNOSIS — F319 Bipolar disorder, unspecified: Secondary | ICD-10-CM | POA: Diagnosis not present

## 2020-10-02 DIAGNOSIS — F411 Generalized anxiety disorder: Secondary | ICD-10-CM | POA: Diagnosis not present

## 2020-10-02 DIAGNOSIS — F4310 Post-traumatic stress disorder, unspecified: Secondary | ICD-10-CM | POA: Diagnosis not present

## 2020-10-02 DIAGNOSIS — F429 Obsessive-compulsive disorder, unspecified: Secondary | ICD-10-CM | POA: Diagnosis not present

## 2020-10-03 IMAGING — DX DG CHEST 2V
2 series · 2 of 2 positions shown · non-contrast
Comparison: January 27, 2014.

CLINICAL DATA: Chest pain.

EXAM:
CHEST - 2 VIEW

[w chest pa]
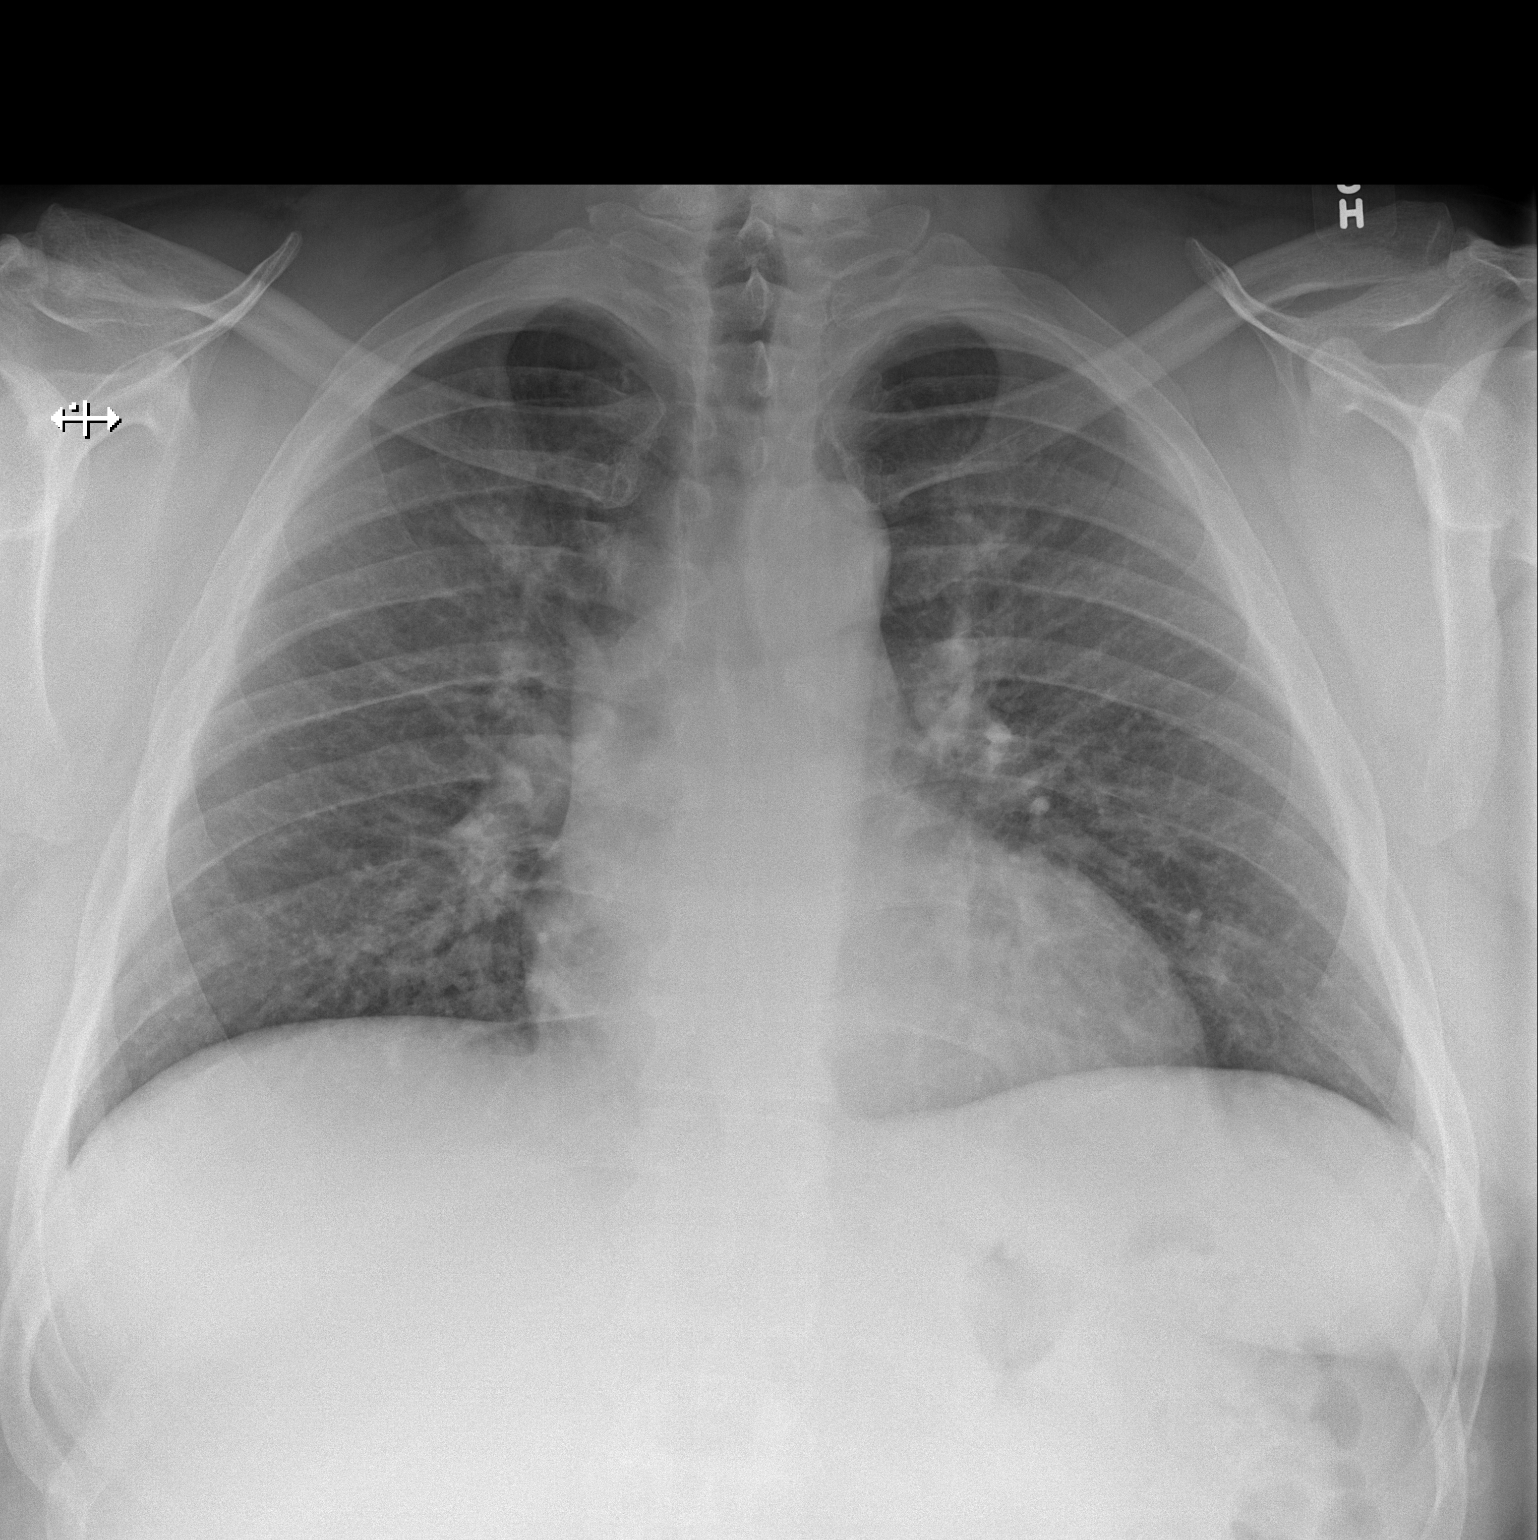

[w chest lat]
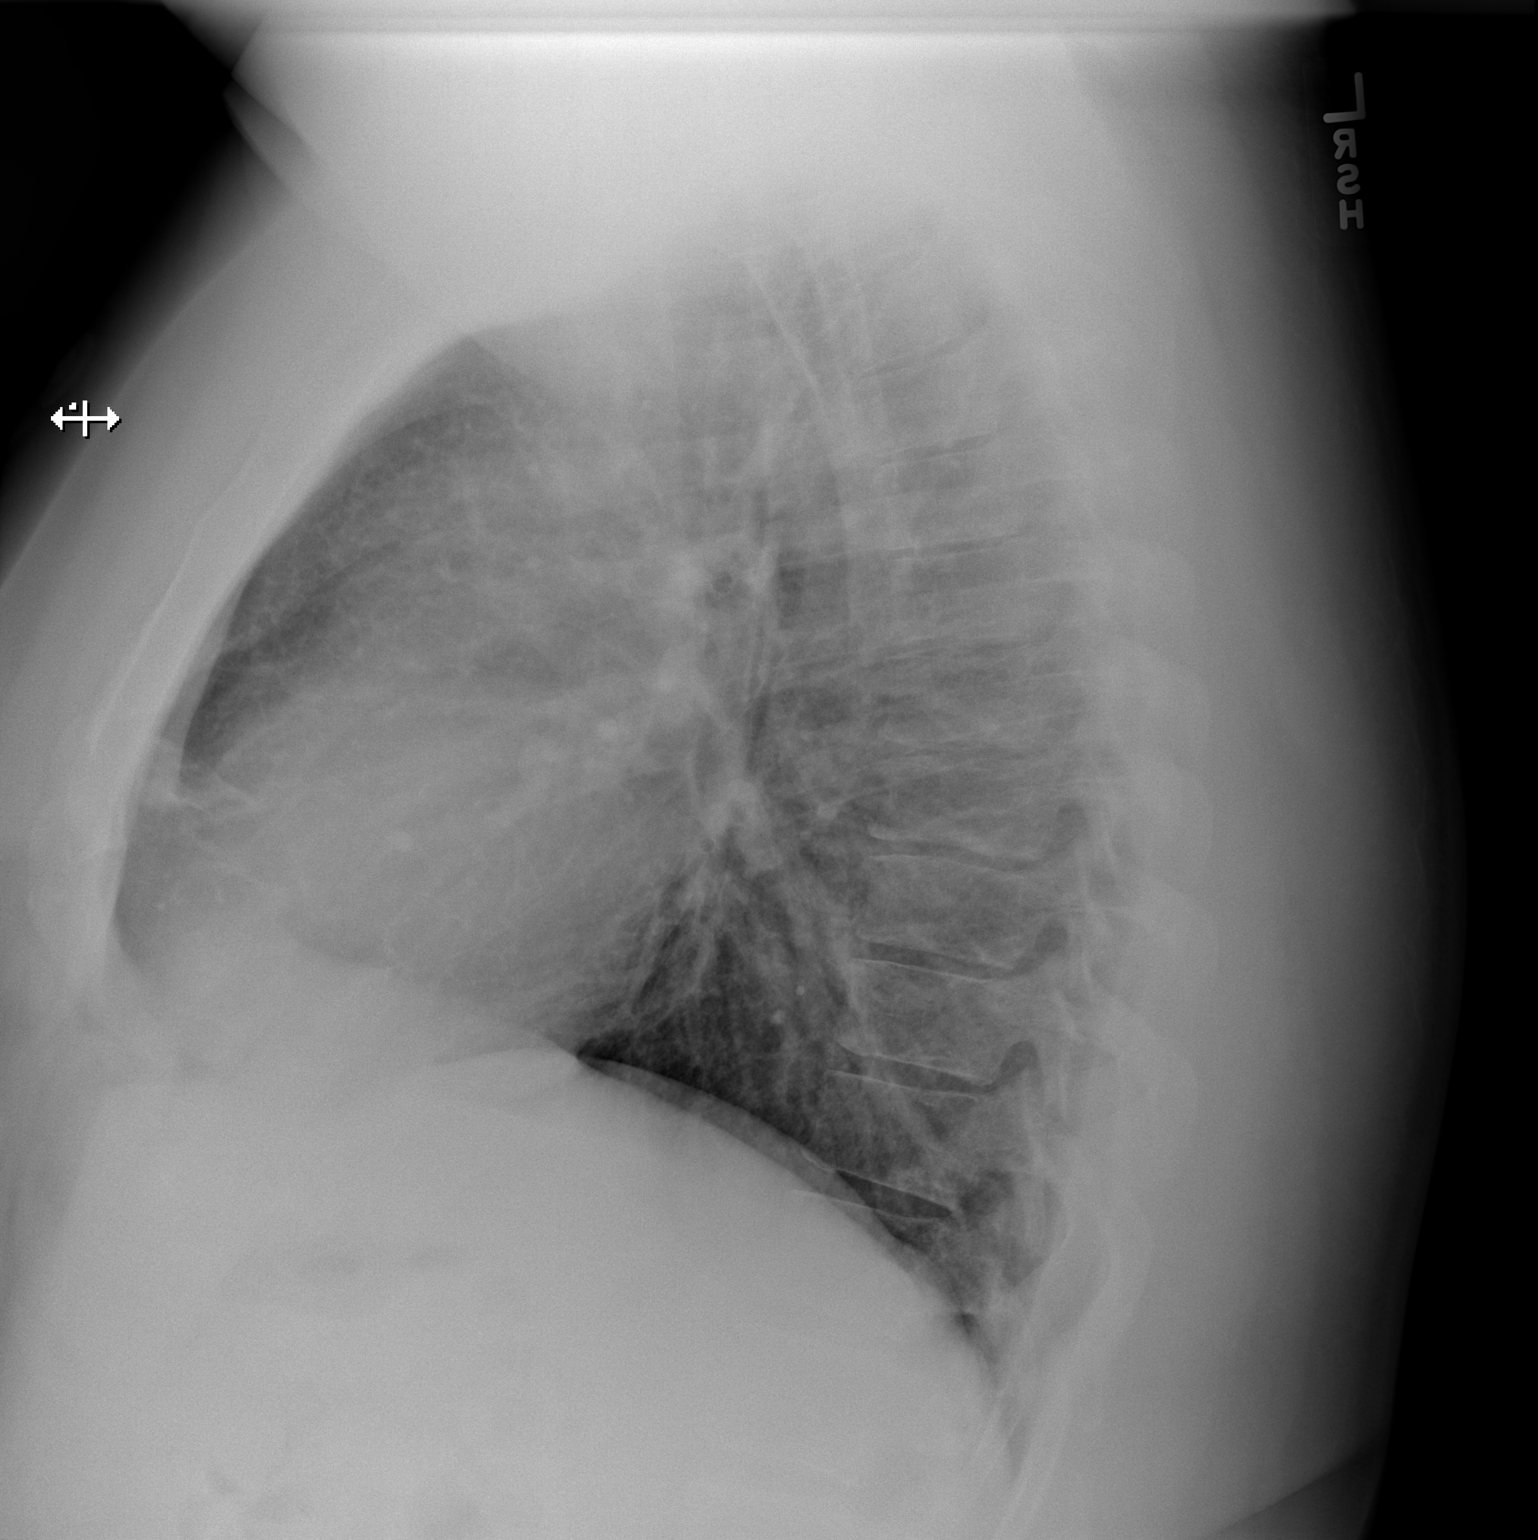

[2 of 2 positions shown; findings below may reference images not displayed]

FINDINGS: The heart size and mediastinal contours are within normal limits.
Both lungs are clear. No pneumothorax or pleural effusion is noted.
The visualized skeletal structures are unremarkable.
IMPRESSION: No active cardiopulmonary disease.

## 2020-12-15 ENCOUNTER — Encounter: Payer: Self-pay | Admitting: Emergency Medicine

## 2020-12-15 ENCOUNTER — Other Ambulatory Visit: Payer: Self-pay

## 2020-12-15 ENCOUNTER — Ambulatory Visit: Payer: BC Managed Care – PPO | Admitting: Emergency Medicine

## 2020-12-15 VITALS — BP 178/113 | HR 100 | Temp 98.0°F | Resp 18 | Ht 72.0 in | Wt 381.0 lb

## 2020-12-15 DIAGNOSIS — Z6841 Body Mass Index (BMI) 40.0 and over, adult: Secondary | ICD-10-CM

## 2020-12-15 DIAGNOSIS — Z9989 Dependence on other enabling machines and devices: Secondary | ICD-10-CM

## 2020-12-15 DIAGNOSIS — I10 Essential (primary) hypertension: Secondary | ICD-10-CM | POA: Diagnosis not present

## 2020-12-15 DIAGNOSIS — F39 Unspecified mood [affective] disorder: Secondary | ICD-10-CM

## 2020-12-15 DIAGNOSIS — G4733 Obstructive sleep apnea (adult) (pediatric): Secondary | ICD-10-CM

## 2020-12-15 DIAGNOSIS — F319 Bipolar disorder, unspecified: Secondary | ICD-10-CM

## 2020-12-15 MED ORDER — AMLODIPINE BESYLATE 10 MG PO TABS: 10.0000 mg | ORAL_TABLET | Freq: Every day | ORAL | 3 refills | Status: DC

## 2020-12-15 NOTE — Assessment & Plan Note (Signed)
Increased 40 pounds since last visit.  Chronic psychiatric condition contributing. Referred to medical weight management clinic. Diet and nutrition discussed.

## 2020-12-15 NOTE — Patient Instructions (Addendum)
   If you have lab work done today you will be contacted with your lab results within the next 2 weeks.  If you have not heard from us then please contact us. The fastest way to get your results is to register for My Chart.   IF you received an x-ray today, you will receive an invoice from [facility]. Please contact [facility] at [phone] with questions or concerns regarding your invoice.   IF you received labwork today, you will receive an invoice from [facility]. Please contact [facility] at [phone] with questions or concerns regarding your invoice.   Our billing staff will not be able to assist you with questions regarding bills from these companies.  You will be contacted with the lab results as soon as they are available. The fastest way to get your results is to activate your My Chart account. Instructions are located on the last page of this paperwork. If you have not heard from us regarding the results in 2 weeks, please contact this office.     Hypertension, Adult High blood pressure (hypertension) is when the force of blood pumping through the arteries is too strong. The arteries are the blood vessels that carry blood from the heart throughout the body. Hypertension forces the heart to work harder to pump blood and may cause arteries to become narrow or stiff. Untreated or uncontrolled hypertension can cause a heart attack, heart failure, a stroke, kidney disease, and other problems. A blood pressure reading consists of a higher number over a lower number. Ideally, your blood pressure should be below 120/80. The first ("top") number is called the systolic pressure. It is a measure of the pressure in your arteries as your heart beats. The second ("bottom") number is called the diastolic pressure. It is a measure of the pressure in your arteries as the heart relaxes. What are the causes? The exact cause of this condition is not known. There are some conditions  that result in or are related to high blood pressure. What increases the risk? Some risk factors for high blood pressure are under your control. The following factors may make you more likely to develop this condition:  Smoking.  Having type 2 diabetes mellitus, high cholesterol, or both.  Not getting enough exercise or physical activity.  Being overweight.  Having too much fat, sugar, calories, or salt (sodium) in your diet.  Drinking too much alcohol. Some risk factors for high blood pressure may be difficult or impossible to change. Some of these factors include:  Having chronic kidney disease.  Having a family history of high blood pressure.  Age. Risk increases with age.  Race. You may be at higher risk if you are African American.  Gender. Men are at higher risk than women before age 45. After age 65, women are at higher risk than men.  Having obstructive sleep apnea.  Stress. What are the signs or symptoms? High blood pressure may not cause symptoms. Very high blood pressure (hypertensive crisis) may cause:  Headache.  Anxiety.  Shortness of breath.  Nosebleed.  Nausea and vomiting.  Vision changes.  Severe chest pain.  Seizures. How is this diagnosed? This condition is diagnosed by measuring your blood pressure while you are seated, with your arm resting on a flat surface, your legs uncrossed, and your feet flat on the floor. The cuff of the blood pressure monitor will be placed directly against the skin of your upper arm at the level of your heart.   It should be measured at least twice using the same arm. Certain conditions can cause a difference in blood pressure between your right and left arms. Certain factors can cause blood pressure readings to be lower or higher than normal for a short period of time:  When your blood pressure is higher when you are in a health care provider's office than when you are at home, this is called white coat hypertension.  Most people with this condition do not need medicines.  When your blood pressure is higher at home than when you are in a health care provider's office, this is called masked hypertension. Most people with this condition may need medicines to control blood pressure. If you have a high blood pressure reading during one visit or you have normal blood pressure with other risk factors, you may be asked to:  Return on a different day to have your blood pressure checked again.  Monitor your blood pressure at home for 1 week or longer. If you are diagnosed with hypertension, you may have other blood or imaging tests to help your health care provider understand your overall risk for other conditions. How is this treated? This condition is treated by making healthy lifestyle changes, such as eating healthy foods, exercising more, and reducing your alcohol intake. Your health care provider may prescribe medicine if lifestyle changes are not enough to get your blood pressure under control, and if:  Your systolic blood pressure is above 130.  Your diastolic blood pressure is above 80. Your personal target blood pressure may vary depending on your medical conditions, your age, and other factors. Follow these instructions at home: Eating and drinking  Eat a diet that is high in fiber and potassium, and low in sodium, added sugar, and fat. An example eating plan is called the DASH (Dietary Approaches to Stop Hypertension) diet. To eat this way: ? Eat plenty of fresh fruits and vegetables. Try to fill one half of your plate at each meal with fruits and vegetables. ? Eat whole grains, such as whole-wheat pasta, brown rice, or whole-grain bread. Fill about one fourth of your plate with whole grains. ? Eat or drink low-fat dairy products, such as skim milk or low-fat yogurt. ? Avoid fatty cuts of meat, processed or cured meats, and poultry with skin. Fill about one fourth of your plate with lean proteins, such  as fish, chicken without skin, beans, eggs, or tofu. ? Avoid pre-made and processed foods. These tend to be higher in sodium, added sugar, and fat.  Reduce your daily sodium intake. Most people with hypertension should eat less than 1,500 mg of sodium a day.  Do not drink alcohol if: ? Your health care provider tells you not to drink. ? You are pregnant, may be pregnant, or are planning to become pregnant.  If you drink alcohol: ? Limit how much you use to:  0-1 drink a day for women.  0-2 drinks a day for men. ? Be aware of how much alcohol is in your drink. In the [place] one drink equals one 12 oz bottle of beer (355 mL), one 5 oz glass of wine (148 mL), or one 1 oz glass of hard liquor (44 mL).   Lifestyle  Work with your health care provider to maintain a healthy body weight or to lose weight. Ask what an ideal weight is for you.  Get at least 30 minutes of exercise most days of the week. Activities may include walking, swimming, or   biking.  Include exercise to strengthen your muscles (resistance exercise), such as Pilates or lifting weights, as part of your weekly exercise routine. Try to do these types of exercises for 30 minutes at least 3 days a week.  Do not use any products that contain nicotine or tobacco, such as cigarettes, e-cigarettes, and chewing tobacco. If you need help quitting, ask your health care provider.  Monitor your blood pressure at home as told by your health care provider.  Keep all follow-up visits as told by your health care provider. This is important.   Medicines  Take over-the-counter and prescription medicines only as told by your health care provider. Follow directions carefully. Blood pressure medicines must be taken as prescribed.  Do not skip doses of blood pressure medicine. Doing this puts you at risk for problems and can make the medicine less effective.  Ask your health care provider about side effects or reactions to medicines that you  should watch for. Contact a health care provider if you:  Think you are having a reaction to a medicine you are taking.  Have headaches that keep coming back (recurring).  Feel dizzy.  Have swelling in your ankles.  Have trouble with your vision. Get help right away if you:  Develop a severe headache or confusion.  Have unusual weakness or numbness.  Feel faint.  Have severe pain in your chest or abdomen.  Vomit repeatedly.  Have trouble breathing. Summary  Hypertension is when the force of blood pumping through your arteries is too strong. If this condition is not controlled, it may put you at risk for serious complications.  Your personal target blood pressure may vary depending on your medical conditions, your age, and other factors. For most people, a normal blood pressure is less than 120/80.  Hypertension is treated with lifestyle changes, medicines, or a combination of both. Lifestyle changes include losing weight, eating a healthy, low-sodium diet, exercising more, and limiting alcohol. This information is not intended to replace advice given to you by your health care provider. Make sure you discuss any questions you have with your health care provider. Document Revised: 06/14/2018 Document Reviewed: 06/14/2018 Elsevier Patient Education  2021 [employer]  

## 2020-12-15 NOTE — Assessment & Plan Note (Signed)
Uncontrolled hypertension despite taking Hyzaar once a day. Continue Hyzaar 100-12.5 mg and start amlodipine 10 mg daily. Diet and nutrition discussed. Follow-up in 3 months.

## 2020-12-15 NOTE — Progress Notes (Signed)
Clinton Taylor 35 y.o.   Chief Complaint  Patient presents with  . Follow-up    Patient states he is here for 6 month follow up on for hypertension. Per patient he has no other concerns.    HISTORY OF PRESENT ILLNESS: This is a 35 y.o. male with history of hypertension here for follow-up.  Presently taking Hyzaar 100-12.5 mg daily.  Compliant with medication. History of chronic psychiatric disorder including mood disorder and bipolar disorder.  Sees psychiatrist on a regular basis. No other complaints or medical concerns today.  HPI   Prior to Admission medications   Medication Sig Start Date End Date Taking? Authorizing Provider  citalopram (CELEXA) 10 MG tablet Take 1 tablet (10 mg total) by mouth daily. 09/02/19  Yes Ophelia Shoulder E, NP  fluvoxaMINE (LUVOX) 100 MG tablet Take 100 mg by mouth at bedtime.   Yes [provider]  lamoTRIgine (LAMICTAL) 100 MG tablet Take 100 mg by mouth daily. 08/26/19  Yes [provider]  losartan-hydrochlorothiazide (HYZAAR) 100-12.5 MG tablet Take 1 tablet by mouth daily. 06/17/20  Yes Wendel Homeyer, Eilleen Kempf, MD  triamcinolone cream (KENALOG) 0.1 % Apply topically 2 (two) times daily. For skin rashes Patient not taking: No sig reported 02/01/14   Sanjuana Kava, NP    No Known Allergies  Patient Active Problem List   Diagnosis Date Noted  . Obsessive compulsive disorder 09/01/2019  . MDD (major depressive disorder), recurrent episode, severe (HCC) 08/31/2019  . Obstructive sleep apnea treated with continuous positive airway pressure (CPAP) 12/06/2018  . Hypertension 01/05/2018  . Mood disorder (HCC) 01/05/2018  . Class 3 severe obesity due to excess calories with serious comorbidity and body mass index (BMI) of 45.0 to 49.9 in adult (HCC) 01/05/2018  . Prediabetes 01/05/2018  . Bipolar I disorder (HCC) 01/29/2014    Past Medical History:  Diagnosis Date  . Anger   . Anxiety   . Depression   . Hiatal hernia   .  Hypertension   . Mood changes   . Obsessive compulsive disorder     No past surgical history on file.  Social History   Socioeconomic History  . Marital status: Married    Spouse name: Not on file  . Number of children: Not on file  . Years of education: Not on file  . Highest education level: Not on file  Occupational History  . Not on file  Tobacco Use  . Smoking status: Former Smoker    Packs/day: 1.00    Types: Cigarettes  . Smokeless tobacco: Former Neurosurgeon    Types: Engineer, drilling  . Vaping Use: Every day  Substance and Sexual Activity  . Alcohol use: No    Comment: socially  . Drug use: No    Types: Marijuana  . Sexual activity: Yes  Other Topics Concern  . Not on file  Social History Narrative  . Not on file   Social Determinants of Health   Financial Resource Strain: Not on file  Food Insecurity: Not on file  Transportation Needs: Not on file  Physical Activity: Not on file  Stress: Not on file  Social Connections: Not on file  Intimate Partner Violence: Not on file    Family History  Problem Relation Age of Onset  . OCD Mother   . Alcohol abuse Father   . Bipolar disorder Father   . Drug abuse Father   . Alcohol abuse Maternal Uncle   . Alcohol abuse Paternal Uncle   .  OCD Maternal Grandfather   . Alcohol abuse Paternal Grandmother   . Drug abuse Paternal Grandmother      Review of Systems  Constitutional: Negative.  Negative for chills and fever.  HENT: Negative.  Negative for congestion and sore throat.   Respiratory: Negative.  Negative for cough and shortness of breath.   Cardiovascular: Negative.  Negative for chest pain and palpitations.  Gastrointestinal: Negative.  Negative for abdominal pain, diarrhea, nausea and vomiting.  Genitourinary: Negative.  Negative for dysuria and hematuria.  Musculoskeletal: Negative.  Negative for joint pain and myalgias.  Skin: Negative.  Negative for rash.  Neurological: Negative.  Negative for  dizziness and headaches.  All other systems reviewed and are negative.   Today's Vitals   12/15/20 1628  BP: (!) 178/113  Pulse: 100  Resp: 18  Temp: 98 F (36.7 C)  TempSrc: Temporal  SpO2: 98%  Weight: (!) 381 lb (172.8 kg)  Height: 6' (1.829 m)   Body mass index is 51.67 kg/m. Wt Readings from Last 3 Encounters:  12/15/20 (!) 381 lb (172.8 kg)  06/17/20 (!) 341 lb (154.7 kg)  08/31/19 (!) 348 lb (157.9 kg)    Physical Exam Vitals reviewed.  Constitutional:      Appearance: He is obese.  HENT:     Head: Normocephalic.  Eyes:     Extraocular Movements: Extraocular movements intact.     Conjunctiva/sclera: Conjunctivae normal.     Pupils: Pupils are equal, round, and reactive to light.  Cardiovascular:     Rate and Rhythm: Normal rate and regular rhythm.     Pulses: Normal pulses.     Heart sounds: Normal heart sounds.  Pulmonary:     Effort: Pulmonary effort is normal.     Breath sounds: Normal breath sounds.  Abdominal:     General: Bowel sounds are normal. There is no distension.     Palpations: Abdomen is soft.     Tenderness: There is no abdominal tenderness.  Musculoskeletal:        General: Normal range of motion.     Cervical back: Normal range of motion and neck supple.  Skin:    General: Skin is warm and dry.     Capillary Refill: Capillary refill takes less than 2 seconds.  Neurological:     General: No focal deficit present.     Mental Status: He is alert and oriented to person, place, and time.  Psychiatric:        Mood and Affect: Mood normal.        Behavior: Behavior normal.     A total of 40 minutes was spent with the patient, greater than 50% of which was in counseling/coordination of care regarding hypertension and morbid obesity and cardiovascular risks associated with these conditions, review of all medications and changes made including addition of amlodipine, review of most recent office visit notes, review of most recent blood work  results, education on nutrition, prognosis, documentation and need for follow-up in 3 months..   ASSESSMENT & PLAN:  Hypertension Uncontrolled hypertension despite taking Hyzaar once a day. Continue Hyzaar 100-12.5 mg and start amlodipine 10 mg daily. Diet and nutrition discussed. Follow-up in 3 months.  Morbid obesity with BMI of 50.0-59.9, adult (HCC) Increased 40 pounds since last visit.  Chronic psychiatric condition contributing. Referred to medical weight management clinic. Diet and nutrition discussed.  Bulmaro was seen today for follow-up.  Diagnoses and all orders for this visit:  Uncontrolled hypertension -  CBC with Differential/Platelet -     Comprehensive metabolic panel -     amLODipine (NORVASC) 10 MG tablet; Take 1 tablet (10 mg total) by mouth daily.  Essential hypertension -     amLODipine (NORVASC) 10 MG tablet; Take 1 tablet (10 mg total) by mouth daily.  Bipolar I disorder (HCC)  Mood disorder (HCC)  Obstructive sleep apnea treated with continuous positive airway pressure (CPAP)  Morbid obesity with BMI of 50.0-59.9, adult (HCC) -     Hemoglobin A1c -     Lipid panel -     Amb Ref to Medical Weight Management  Primary hypertension    Patient Instructions       If you have lab work done today you will be contacted with your lab results within the next 2 weeks.  If you have not heard from [place] then please contact us. The fastest way to get your results is to register for My Chart.   IF you received an x-ray today, you will receive an invoice from [facility]. Please contact [facility] at [phone] with questions or concerns regarding your invoice.   IF you received labwork today, you will receive an invoice from [facility]. Please contact [facility] at [phone] with questions or concerns regarding your invoice.   Our billing staff will not be able to assist you with questions regarding bills from these companies.  You  will be contacted with the lab results as soon as they are available. The fastest way to get your results is to activate your My Chart account. Instructions are located on the last page of this paperwork. If you have not heard from [place] regarding the results in 2 weeks, please contact this office.      Hypertension, Adult High blood pressure (hypertension) is when the force of blood pumping through the arteries is too strong. The arteries are the blood vessels that carry blood from the heart throughout the body. Hypertension forces the heart to work harder to pump blood and may cause arteries to become narrow or stiff. Untreated or uncontrolled hypertension can cause a heart attack, heart failure, a stroke, kidney disease, and other problems. A blood pressure reading consists of a higher number over a lower number. Ideally, your blood pressure should be below 120/80. The first ("top") number is called the systolic pressure. It is a measure of the pressure in your arteries as your heart beats. The second ("bottom") number is called the diastolic pressure. It is a measure of the pressure in your arteries as the heart relaxes. What are the causes? The exact cause of this condition is not known. There are some conditions that result in or are related to high blood pressure. What increases the risk? Some risk factors for high blood pressure are under your control. The following factors may make you more likely to develop this condition:  Smoking.  Having type 2 diabetes mellitus, high cholesterol, or both.  Not getting enough exercise or physical activity.  Being overweight.  Having too much fat, sugar, calories, or salt (sodium) in your diet.  Drinking too much alcohol. Some risk factors for high blood pressure may be difficult or impossible to change. Some of these factors include:  Having chronic kidney disease.  Having a family history of high blood pressure.  Age. Risk increases with  age.  Race. You may be at higher risk if you are African American.  Gender. Men are at higher risk than women before age 67. After  age 37, women are at higher risk than men.  Having obstructive sleep apnea.  Stress. What are the signs or symptoms? High blood pressure may not cause symptoms. Very high blood pressure (hypertensive crisis) may cause:  Headache.  Anxiety.  Shortness of breath.  Nosebleed.  Nausea and vomiting.  Vision changes.  Severe chest pain.  Seizures. How is this diagnosed? This condition is diagnosed by measuring your blood pressure while you are seated, with your arm resting on a flat surface, your legs uncrossed, and your feet flat on the floor. The cuff of the blood pressure monitor will be placed directly against the skin of your upper arm at the level of your heart. It should be measured at least twice using the same arm. Certain conditions can cause a difference in blood pressure between your right and left arms. Certain factors can cause blood pressure readings to be lower or higher than normal for a short period of time:  When your blood pressure is higher when you are in a health care provider's office than when you are at home, this is called white coat hypertension. Most people with this condition do not need medicines.  When your blood pressure is higher at home than when you are in a health care provider's office, this is called masked hypertension. Most people with this condition may need medicines to control blood pressure. If you have a high blood pressure reading during one visit or you have normal blood pressure with other risk factors, you may be asked to:  Return on a different day to have your blood pressure checked again.  Monitor your blood pressure at home for 1 week or longer. If you are diagnosed with hypertension, you may have other blood or imaging tests to help your health care provider understand your overall risk for other  conditions. How is this treated? This condition is treated by making healthy lifestyle changes, such as eating healthy foods, exercising more, and reducing your alcohol intake. Your health care provider may prescribe medicine if lifestyle changes are not enough to get your blood pressure under control, and if:  Your systolic blood pressure is above 130.  Your diastolic blood pressure is above 80. Your personal target blood pressure may vary depending on your medical conditions, your age, and other factors. Follow these instructions at home: Eating and drinking  Eat a diet that is high in fiber and potassium, and low in sodium, added sugar, and fat. An example eating plan is called the DASH (Dietary Approaches to Stop Hypertension) diet. To eat this way: ? Eat plenty of fresh fruits and vegetables. Try to fill one half of your plate at each meal with fruits and vegetables. ? Eat whole grains, such as whole-wheat pasta, brown rice, or whole-grain bread. Fill about one fourth of your plate with whole grains. ? Eat or drink low-fat dairy products, such as skim milk or low-fat yogurt. ? Avoid fatty cuts of meat, processed or cured meats, and poultry with skin. Fill about one fourth of your plate with lean proteins, such as fish, chicken without skin, beans, eggs, or tofu. ? Avoid pre-made and processed foods. These tend to be higher in sodium, added sugar, and fat.  Reduce your daily sodium intake. Most people with hypertension should eat less than 1,500 mg of sodium a day.  Do not drink alcohol if: ? Your health care provider tells you not to drink. ? You are pregnant, may be pregnant, or are planning to  become pregnant.  If you drink alcohol: ? Limit how much you use to:  0-1 drink a day for women.  0-2 drinks a day for men. ? Be aware of how much alcohol is in your drink. In the [place] one drink equals one 12 oz bottle of beer (355 mL), one 5 oz glass of wine (148 mL), or one 1 oz glass  of hard liquor (44 mL).   Lifestyle  Work with your health care provider to maintain a healthy body weight or to lose weight. Ask what an ideal weight is for you.  Get at least 30 minutes of exercise most days of the week. Activities may include walking, swimming, or biking.  Include exercise to strengthen your muscles (resistance exercise), such as Pilates or lifting weights, as part of your weekly exercise routine. Try to do these types of exercises for 30 minutes at least 3 days a week.  Do not use any products that contain nicotine or tobacco, such as cigarettes, e-cigarettes, and chewing tobacco. If you need help quitting, ask your health care provider.  Monitor your blood pressure at home as told by your health care provider.  Keep all follow-up visits as told by your health care provider. This is important.   Medicines  Take over-the-counter and prescription medicines only as told by your health care provider. Follow directions carefully. Blood pressure medicines must be taken as prescribed.  Do not skip doses of blood pressure medicine. Doing this puts you at risk for problems and can make the medicine less effective.  Ask your health care provider about side effects or reactions to medicines that you should watch for. Contact a health care provider if you:  Think you are having a reaction to a medicine you are taking.  Have headaches that keep coming back (recurring).  Feel dizzy.  Have swelling in your ankles.  Have trouble with your vision. Get help right away if you:  Develop a severe headache or confusion.  Have unusual weakness or numbness.  Feel faint.  Have severe pain in your chest or abdomen.  Vomit repeatedly.  Have trouble breathing. Summary  Hypertension is when the force of blood pumping through your arteries is too strong. If this condition is not controlled, it may put you at risk for serious complications.  Your personal target blood pressure  may vary depending on your medical conditions, your age, and other factors. For most people, a normal blood pressure is less than 120/80.  Hypertension is treated with lifestyle changes, medicines, or a combination of both. Lifestyle changes include losing weight, eating a healthy, low-sodium diet, exercising more, and limiting alcohol. This information is not intended to replace advice given to you by your health care provider. Make sure you discuss any questions you have with your health care provider. Document Revised: 06/14/2018 Document Reviewed: 06/14/2018 Elsevier Patient Education  2021 Elsevier Inc.      Edwina Barth, MD [facility]

## 2020-12-16 LAB — COMPREHENSIVE METABOLIC PANEL
ALT: 20 IU/L (ref 0–44)
AST: 20 IU/L (ref 0–40)
Albumin/Globulin Ratio: 2 (ref 1.2–2.2)
Albumin: 4.2 g/dL (ref 4.0–5.0)
Alkaline Phosphatase: 98 IU/L (ref 44–121)
BUN/Creatinine Ratio: 13 (ref 9–20)
BUN: 15 mg/dL (ref 6–20)
Bilirubin Total: 0.3 mg/dL (ref 0.0–1.2)
CO2: 23 mmol/L (ref 20–29)
Calcium: 9.1 mg/dL (ref 8.7–10.2)
Chloride: 103 mmol/L (ref 96–106)
Creatinine, Ser: 1.15 mg/dL (ref 0.76–1.27)
Globulin, Total: 2.1 g/dL (ref 1.5–4.5)
Glucose: 79 mg/dL (ref 65–99)
Potassium: 3.6 mmol/L (ref 3.5–5.2)
Sodium: 142 mmol/L (ref 134–144)
Total Protein: 6.3 g/dL (ref 6.0–8.5)
eGFR: 85 mL/min/{1.73_m2} (ref >59)

## 2020-12-16 LAB — CBC WITH DIFFERENTIAL/PLATELET
Basophils Absolute: 0 10*3/uL (ref 0.0–0.2)
Basos: 0 %
EOS (ABSOLUTE): 0.3 10*3/uL (ref 0.0–0.4)
Eos: 2 %
Hematocrit: 43.3 % (ref 37.5–51.0)
Hemoglobin: 14.9 g/dL (ref 13.0–17.7)
Immature Grans (Abs): 0.2 10*3/uL — ABNORMAL HIGH (ref 0.0–0.1)
Immature Granulocytes: 1 %
Lymphocytes Absolute: 3.2 10*3/uL — ABNORMAL HIGH (ref 0.7–3.1)
Lymphs: 23 %
MCH: 28.2 pg (ref 26.6–33.0)
MCHC: 34.4 g/dL (ref 31.5–35.7)
MCV: 82 fL (ref 79–97)
Monocytes Absolute: 0.8 10*3/uL (ref 0.1–0.9)
Monocytes: 6 %
Neutrophils Absolute: 9.5 10*3/uL — ABNORMAL HIGH (ref 1.4–7.0)
Neutrophils: 68 %
Platelets: 267 10*3/uL (ref 150–450)
RBC: 5.28 x10E6/uL (ref 4.14–5.80)
RDW: 13.8 % (ref 11.6–15.4)
WBC: 14 10*3/uL — ABNORMAL HIGH (ref 3.4–10.8)

## 2020-12-16 LAB — HEMOGLOBIN A1C
Est. average glucose Bld gHb Est-mCnc: 108 mg/dL
Hgb A1c MFr Bld: 5.4 % (ref 4.8–5.6)

## 2020-12-16 LAB — LIPID PANEL
Chol/HDL Ratio: 6.3 ratio — ABNORMAL HIGH (ref 0.0–5.0)
Cholesterol, Total: 164 mg/dL (ref 100–199)
HDL: 26 mg/dL — ABNORMAL LOW (ref >39)
LDL Chol Calc (NIH): 103 mg/dL — ABNORMAL HIGH (ref 0–99)
Triglycerides: 198 mg/dL — ABNORMAL HIGH (ref 0–149)
VLDL Cholesterol Cal: 35 mg/dL (ref 5–40)

## 2020-12-26 DIAGNOSIS — G4733 Obstructive sleep apnea (adult) (pediatric): Secondary | ICD-10-CM | POA: Diagnosis not present

## 2020-12-31 DIAGNOSIS — F332 Major depressive disorder, recurrent severe without psychotic features: Secondary | ICD-10-CM | POA: Diagnosis not present

## 2021-01-01 DIAGNOSIS — F332 Major depressive disorder, recurrent severe without psychotic features: Secondary | ICD-10-CM | POA: Diagnosis not present

## 2021-01-02 DIAGNOSIS — F332 Major depressive disorder, recurrent severe without psychotic features: Secondary | ICD-10-CM | POA: Diagnosis not present

## 2021-01-05 DIAGNOSIS — F332 Major depressive disorder, recurrent severe without psychotic features: Secondary | ICD-10-CM | POA: Diagnosis not present

## 2021-01-06 DIAGNOSIS — F332 Major depressive disorder, recurrent severe without psychotic features: Secondary | ICD-10-CM | POA: Diagnosis not present

## 2021-01-07 DIAGNOSIS — F332 Major depressive disorder, recurrent severe without psychotic features: Secondary | ICD-10-CM | POA: Diagnosis not present

## 2021-01-08 DIAGNOSIS — Z79890 Hormone replacement therapy (postmenopausal): Secondary | ICD-10-CM | POA: Diagnosis not present

## 2021-01-08 DIAGNOSIS — I10 Essential (primary) hypertension: Secondary | ICD-10-CM | POA: Diagnosis not present

## 2021-01-08 DIAGNOSIS — R5382 Chronic fatigue, unspecified: Secondary | ICD-10-CM | POA: Diagnosis not present

## 2021-01-08 DIAGNOSIS — R635 Abnormal weight gain: Secondary | ICD-10-CM | POA: Diagnosis not present

## 2021-01-08 DIAGNOSIS — E291 Testicular hypofunction: Secondary | ICD-10-CM | POA: Diagnosis not present

## 2021-01-08 DIAGNOSIS — Z6841 Body Mass Index (BMI) 40.0 and over, adult: Secondary | ICD-10-CM | POA: Diagnosis not present

## 2021-01-08 DIAGNOSIS — E8881 Metabolic syndrome: Secondary | ICD-10-CM | POA: Diagnosis not present

## 2021-01-08 DIAGNOSIS — E559 Vitamin D deficiency, unspecified: Secondary | ICD-10-CM | POA: Diagnosis not present

## 2021-01-08 DIAGNOSIS — E7800 Pure hypercholesterolemia, unspecified: Secondary | ICD-10-CM | POA: Diagnosis not present

## 2021-01-08 DIAGNOSIS — F332 Major depressive disorder, recurrent severe without psychotic features: Secondary | ICD-10-CM | POA: Diagnosis not present

## 2021-01-09 DIAGNOSIS — F332 Major depressive disorder, recurrent severe without psychotic features: Secondary | ICD-10-CM | POA: Diagnosis not present

## 2021-01-12 DIAGNOSIS — F422 Mixed obsessional thoughts and acts: Secondary | ICD-10-CM | POA: Diagnosis not present

## 2021-01-12 DIAGNOSIS — F332 Major depressive disorder, recurrent severe without psychotic features: Secondary | ICD-10-CM | POA: Diagnosis not present

## 2021-01-12 DIAGNOSIS — F331 Major depressive disorder, recurrent, moderate: Secondary | ICD-10-CM | POA: Diagnosis not present

## 2021-01-13 DIAGNOSIS — F332 Major depressive disorder, recurrent severe without psychotic features: Secondary | ICD-10-CM | POA: Diagnosis not present

## 2021-01-14 DIAGNOSIS — F332 Major depressive disorder, recurrent severe without psychotic features: Secondary | ICD-10-CM | POA: Diagnosis not present

## 2021-01-15 DIAGNOSIS — I10 Essential (primary) hypertension: Secondary | ICD-10-CM | POA: Diagnosis not present

## 2021-01-15 DIAGNOSIS — E291 Testicular hypofunction: Secondary | ICD-10-CM | POA: Diagnosis not present

## 2021-01-15 DIAGNOSIS — Z6841 Body Mass Index (BMI) 40.0 and over, adult: Secondary | ICD-10-CM | POA: Diagnosis not present

## 2021-01-15 DIAGNOSIS — F332 Major depressive disorder, recurrent severe without psychotic features: Secondary | ICD-10-CM | POA: Diagnosis not present

## 2021-01-15 DIAGNOSIS — N529 Male erectile dysfunction, unspecified: Secondary | ICD-10-CM | POA: Diagnosis not present

## 2021-01-16 DIAGNOSIS — F332 Major depressive disorder, recurrent severe without psychotic features: Secondary | ICD-10-CM | POA: Diagnosis not present

## 2021-01-19 DIAGNOSIS — F332 Major depressive disorder, recurrent severe without psychotic features: Secondary | ICD-10-CM | POA: Diagnosis not present

## 2021-01-20 DIAGNOSIS — F332 Major depressive disorder, recurrent severe without psychotic features: Secondary | ICD-10-CM | POA: Diagnosis not present

## 2021-01-21 DIAGNOSIS — F332 Major depressive disorder, recurrent severe without psychotic features: Secondary | ICD-10-CM | POA: Diagnosis not present

## 2021-01-23 DIAGNOSIS — E291 Testicular hypofunction: Secondary | ICD-10-CM | POA: Diagnosis not present

## 2021-01-23 DIAGNOSIS — Z6841 Body Mass Index (BMI) 40.0 and over, adult: Secondary | ICD-10-CM | POA: Diagnosis not present

## 2021-01-23 DIAGNOSIS — E559 Vitamin D deficiency, unspecified: Secondary | ICD-10-CM | POA: Diagnosis not present

## 2021-01-23 DIAGNOSIS — F332 Major depressive disorder, recurrent severe without psychotic features: Secondary | ICD-10-CM | POA: Diagnosis not present

## 2021-01-26 ENCOUNTER — Other Ambulatory Visit: Payer: Self-pay | Admitting: Emergency Medicine

## 2021-01-26 DIAGNOSIS — F332 Major depressive disorder, recurrent severe without psychotic features: Secondary | ICD-10-CM | POA: Diagnosis not present

## 2021-01-26 DIAGNOSIS — E291 Testicular hypofunction: Secondary | ICD-10-CM | POA: Diagnosis not present

## 2021-01-26 DIAGNOSIS — F39 Unspecified mood [affective] disorder: Secondary | ICD-10-CM

## 2021-01-27 DIAGNOSIS — F332 Major depressive disorder, recurrent severe without psychotic features: Secondary | ICD-10-CM | POA: Diagnosis not present

## 2021-01-28 DIAGNOSIS — Z6841 Body Mass Index (BMI) 40.0 and over, adult: Secondary | ICD-10-CM | POA: Diagnosis not present

## 2021-01-28 DIAGNOSIS — F332 Major depressive disorder, recurrent severe without psychotic features: Secondary | ICD-10-CM | POA: Diagnosis not present

## 2021-01-28 DIAGNOSIS — G4733 Obstructive sleep apnea (adult) (pediatric): Secondary | ICD-10-CM | POA: Diagnosis not present

## 2021-01-28 DIAGNOSIS — N529 Male erectile dysfunction, unspecified: Secondary | ICD-10-CM | POA: Diagnosis not present

## 2021-01-29 DIAGNOSIS — F332 Major depressive disorder, recurrent severe without psychotic features: Secondary | ICD-10-CM | POA: Diagnosis not present

## 2021-01-29 NOTE — Telephone Encounter (Signed)
Opened in error

## 2021-02-02 DIAGNOSIS — F332 Major depressive disorder, recurrent severe without psychotic features: Secondary | ICD-10-CM | POA: Diagnosis not present

## 2021-02-03 DIAGNOSIS — F332 Major depressive disorder, recurrent severe without psychotic features: Secondary | ICD-10-CM | POA: Diagnosis not present

## 2021-02-03 DIAGNOSIS — E291 Testicular hypofunction: Secondary | ICD-10-CM | POA: Diagnosis not present

## 2021-02-06 DIAGNOSIS — F332 Major depressive disorder, recurrent severe without psychotic features: Secondary | ICD-10-CM | POA: Diagnosis not present

## 2021-02-09 DIAGNOSIS — F332 Major depressive disorder, recurrent severe without psychotic features: Secondary | ICD-10-CM | POA: Diagnosis not present

## 2021-02-10 DIAGNOSIS — F332 Major depressive disorder, recurrent severe without psychotic features: Secondary | ICD-10-CM | POA: Diagnosis not present

## 2021-02-11 DIAGNOSIS — E291 Testicular hypofunction: Secondary | ICD-10-CM | POA: Diagnosis not present

## 2021-02-13 DIAGNOSIS — G4733 Obstructive sleep apnea (adult) (pediatric): Secondary | ICD-10-CM | POA: Diagnosis not present

## 2021-02-13 DIAGNOSIS — E8881 Metabolic syndrome: Secondary | ICD-10-CM | POA: Diagnosis not present

## 2021-02-13 DIAGNOSIS — Z6841 Body Mass Index (BMI) 40.0 and over, adult: Secondary | ICD-10-CM | POA: Diagnosis not present

## 2021-02-14 DIAGNOSIS — F331 Major depressive disorder, recurrent, moderate: Secondary | ICD-10-CM | POA: Diagnosis not present

## 2021-02-14 DIAGNOSIS — F422 Mixed obsessional thoughts and acts: Secondary | ICD-10-CM | POA: Diagnosis not present

## 2021-02-16 DIAGNOSIS — F332 Major depressive disorder, recurrent severe without psychotic features: Secondary | ICD-10-CM | POA: Diagnosis not present

## 2021-02-17 DIAGNOSIS — F332 Major depressive disorder, recurrent severe without psychotic features: Secondary | ICD-10-CM | POA: Diagnosis not present

## 2021-02-18 DIAGNOSIS — F332 Major depressive disorder, recurrent severe without psychotic features: Secondary | ICD-10-CM | POA: Diagnosis not present

## 2021-02-19 DIAGNOSIS — N529 Male erectile dysfunction, unspecified: Secondary | ICD-10-CM | POA: Diagnosis not present

## 2021-02-19 DIAGNOSIS — Z6841 Body Mass Index (BMI) 40.0 and over, adult: Secondary | ICD-10-CM | POA: Diagnosis not present

## 2021-02-19 DIAGNOSIS — I10 Essential (primary) hypertension: Secondary | ICD-10-CM | POA: Diagnosis not present

## 2021-02-20 DIAGNOSIS — R5382 Chronic fatigue, unspecified: Secondary | ICD-10-CM | POA: Diagnosis not present

## 2021-02-20 DIAGNOSIS — E291 Testicular hypofunction: Secondary | ICD-10-CM | POA: Diagnosis not present

## 2021-02-20 DIAGNOSIS — Z79890 Hormone replacement therapy (postmenopausal): Secondary | ICD-10-CM | POA: Diagnosis not present

## 2021-02-23 DIAGNOSIS — F332 Major depressive disorder, recurrent severe without psychotic features: Secondary | ICD-10-CM | POA: Diagnosis not present

## 2021-02-24 DIAGNOSIS — F332 Major depressive disorder, recurrent severe without psychotic features: Secondary | ICD-10-CM | POA: Diagnosis not present

## 2021-03-02 DIAGNOSIS — F332 Major depressive disorder, recurrent severe without psychotic features: Secondary | ICD-10-CM | POA: Diagnosis not present

## 2021-03-10 ENCOUNTER — Telehealth: Payer: Self-pay | Admitting: Emergency Medicine

## 2021-03-10 NOTE — Telephone Encounter (Signed)
1.Medication Requested: citalopram (CELEXA) 10 MG tablet  losartan-hydrochlorothiazide (HYZAAR) 100-12.5 MG tablet  2. Pharmacy (Name, Street, [place]): [facility] [phone] - [place], [place] - [address] [place] [address] [place] [address] 3. On Med List: Yes, both 4. Last Visit with PCP: 2.28.22 5. Next visit date with PCP: Previously 5.26.22 had to reschedule to 6.9.22 due to provider being out of office  Agent: Please be advised that RX refills may take up to 3 business days. We ask that you follow-up with your pharmacy.

## 2021-03-12 ENCOUNTER — Ambulatory Visit: Payer: Self-pay | Admitting: Emergency Medicine

## 2021-03-12 ENCOUNTER — Ambulatory Visit: Payer: BLUE CROSS/BLUE SHIELD | Admitting: Emergency Medicine

## 2021-03-13 ENCOUNTER — Other Ambulatory Visit: Payer: Self-pay

## 2021-03-13 DIAGNOSIS — I10 Essential (primary) hypertension: Secondary | ICD-10-CM

## 2021-03-13 MED ORDER — LOSARTAN POTASSIUM-HCTZ 100-12.5 MG PO TABS: 1.0000 | ORAL_TABLET | Freq: Every day | ORAL | 3 refills | Status: DC

## 2021-03-13 NOTE — Telephone Encounter (Signed)
Patient is requesting a refill of the following medications: Requested Prescriptions   Pending Prescriptions Disp Refills  . citalopram (CELEXA) 10 MG tablet 30 tablet 0    Sig: Take 1 tablet (10 mg total) by mouth daily.    Date of patient request: 03/10/21  Last office visit: 12/15/20  Date of last refill: 09/01/21  Last refill amount: 30  Follow up time period per chart: 03/26/21

## 2021-03-13 NOTE — Telephone Encounter (Signed)
Refilled prescription 

## 2021-03-13 NOTE — Telephone Encounter (Signed)
Refilled medication

## 2021-03-19 ENCOUNTER — Ambulatory Visit: Payer: BC Managed Care – PPO | Admitting: Podiatry

## 2021-03-26 ENCOUNTER — Ambulatory Visit: Payer: Self-pay | Admitting: Emergency Medicine

## 2021-03-26 DIAGNOSIS — Z0289 Encounter for other administrative examinations: Secondary | ICD-10-CM

## 2021-03-31 ENCOUNTER — Encounter (HOSPITAL_COMMUNITY): Payer: Self-pay

## 2021-03-31 ENCOUNTER — Emergency Department (HOSPITAL_COMMUNITY): Payer: BC Managed Care – PPO

## 2021-03-31 ENCOUNTER — Emergency Department (HOSPITAL_COMMUNITY)
Admission: EM | Admit: 2021-03-31 | Discharge: 2021-04-01 | Disposition: A | Discharge status: Home / Self Care | Payer: BC Managed Care – PPO | Attending: Emergency Medicine | Admitting: Emergency Medicine

## 2021-03-31 ENCOUNTER — Other Ambulatory Visit: Payer: Self-pay

## 2021-03-31 DIAGNOSIS — Y9389 Activity, other specified: Secondary | ICD-10-CM | POA: Insufficient documentation

## 2021-03-31 DIAGNOSIS — Z79899 Other long term (current) drug therapy: Secondary | ICD-10-CM | POA: Diagnosis not present

## 2021-03-31 DIAGNOSIS — Z87891 Personal history of nicotine dependence: Secondary | ICD-10-CM | POA: Diagnosis not present

## 2021-03-31 DIAGNOSIS — X500XXA Overexertion from strenuous movement or load, initial encounter: Secondary | ICD-10-CM | POA: Diagnosis not present

## 2021-03-31 DIAGNOSIS — M25561 Pain in right knee: Secondary | ICD-10-CM

## 2021-03-31 DIAGNOSIS — S8991XA Unspecified injury of right lower leg, initial encounter: Secondary | ICD-10-CM | POA: Insufficient documentation

## 2021-03-31 DIAGNOSIS — I10 Essential (primary) hypertension: Secondary | ICD-10-CM | POA: Insufficient documentation

## 2021-03-31 DIAGNOSIS — Y92019 Unspecified place in single-family (private) house as the place of occurrence of the external cause: Secondary | ICD-10-CM | POA: Diagnosis not present

## 2021-03-31 NOTE — ED Provider Notes (Signed)
Emergency Medicine Provider Triage Evaluation Note  Clinton Taylor , a 35 y.o. male  was evaluated in triage.  Pt complains of right knee pain  Review of Systems  Positive: Pop in knee Negative: fever  Physical Exam  BP (!) 220/120 (BP Location: Right Arm)   Pulse 97   Temp 99.1 F (37.3 C)   Resp 18   SpO2 95%  Gen:   Awake, no distress   Resp:  Normal effort  MSK:   Effusion right knee  Other:    Medical Decision Making  Medically screening exam initiated at 4:37 PM.  Appropriate orders placed.  Alexander Aument was informed that the remainder of the evaluation will be completed by another provider, this initial triage assessment does not replace that evaluation, and the importance of remaining in the ED until their evaluation is complete.  Pt takes blood pressure medications at night    Clinton Taylor 03/31/21 1638    Gwyneth Sprout, MD 04/03/21 1354

## 2021-03-31 NOTE — ED Triage Notes (Signed)
pt c/o Right knee pain after working around his house this weekend om the ladder. He felt like something popped in his Right knee. Denies any falls or injury. Pt takes meds for HTN, has not missed any doses. Pt HTN in triage

## 2021-04-01 ENCOUNTER — Ambulatory Visit: Payer: BC Managed Care – PPO | Admitting: Internal Medicine

## 2021-04-01 DIAGNOSIS — Z0289 Encounter for other administrative examinations: Secondary | ICD-10-CM

## 2021-04-01 DIAGNOSIS — S80911A Unspecified superficial injury of right knee, initial encounter: Secondary | ICD-10-CM | POA: Diagnosis not present

## 2021-04-01 DIAGNOSIS — M25561 Pain in right knee: Secondary | ICD-10-CM | POA: Diagnosis not present

## 2021-04-01 MED ORDER — KETOROLAC TROMETHAMINE 60 MG/2ML IM SOLN
30.0000 mg | Freq: Once | INTRAMUSCULAR | Status: AC
  Administered 2021-04-01: 30 mg via INTRAMUSCULAR
  Filled 2021-04-01: qty 2

## 2021-04-01 MED ORDER — OXYCODONE-ACETAMINOPHEN 5-325 MG PO TABS: 2.0000 | ORAL_TABLET | Freq: Four times a day (QID) | ORAL | 0 refills | Status: DC | PRN

## 2021-04-01 MED ORDER — OXYCODONE-ACETAMINOPHEN 5-325 MG PO TABS
2.0000 | ORAL_TABLET | Freq: Once | ORAL | Status: AC
  Administered 2021-04-01: 2 via ORAL
  Filled 2021-04-01: qty 2

## 2021-04-01 NOTE — ED Notes (Signed)
Ortho tech paged, will see patient shortly.

## 2021-04-01 NOTE — ED Notes (Signed)
Mesner MD aware of hypertension

## 2021-04-01 NOTE — Progress Notes (Signed)
Orthopedic Tech Progress Note Patient Details:  Clinton Taylor 15-Feb-1986 [id number]  Ortho Devices Type of Ortho Device: Knee Immobilizer, Crutches Ortho Device/Splint Location: Right Lower Extremity Ortho Device/Splint Interventions: Ordered, Application, Adjustment   Post Interventions Patient Tolerated: Well Instructions Provided: Adjustment of device, Care of device, Poper ambulation with device  April Carlyon P Harle Stanford 04/01/2021, 4:22 AM

## 2021-04-01 NOTE — ED Provider Notes (Signed)
[facility] EMERGENCY DEPARTMENT Provider Note   CSN: [id number] Arrival date & time: 03/31/21  1527     History Chief Complaint  Patient presents with   Knee Injury    Clinton Taylor is a 35 y.o. male.  35 year old morbidly obese male the presents emerged from today secondary to knee pain.  Patient states he was, down a ladder.  He was bearing full weight on his right knee.  He heard and felt a pop and then had some pain in that knee and it was weak and he cannot use it very well.  He was able to ambulate somewhat throughout that day but it progressively got worse.  This was on Sunday.  He states over the last couple days is progressively worsened to the point where the pain so bad he can barely walk on it now.  He had an appoint with his doctor for tomorrow however the pain got more severe today so presented here for further evaluation.  Patient has not any recent illnesses.  No fevers.  No redness around the knee.  No other falls or other trauma.  Patient's not any problems with his knees before.  He is currently on a weight loss diet. Has been using ibuprofen, ice and elevation at home with only minimal help in symptom.        Past Medical History:  Diagnosis Date   Anger    Anxiety    Depression    Hiatal hernia    Hypertension    Mood changes    Obsessive compulsive disorder     Patient Active Problem List   Diagnosis Date Noted   Obsessive compulsive disorder 09/01/2019   MDD (major depressive disorder), recurrent episode, severe (HCC) 08/31/2019   Obstructive sleep apnea treated with continuous positive airway pressure (CPAP) 12/06/2018   Hypertension 01/05/2018   Mood disorder (HCC) 01/05/2018   Morbid obesity with BMI of 50.0-59.9, adult (HCC) 01/05/2018   Prediabetes 01/05/2018   Bipolar I disorder (HCC) 01/29/2014    No past surgical history on file.     Family History  Problem Relation Age of Onset   OCD Mother    Alcohol abuse  Father    Bipolar disorder Father    Drug abuse Father    Alcohol abuse Maternal Uncle    Alcohol abuse Paternal Uncle    OCD Maternal Grandfather    Alcohol abuse Paternal Grandmother    Drug abuse Paternal Grandmother     Social History   Tobacco Use   Smoking status: Former    Packs/day: 1.00    Pack years: 0.00    Types: Cigarettes   Smokeless tobacco: Former    Types: Associate Professor Use: Every day  Substance Use Topics   Alcohol use: No    Comment: socially   Drug use: No    Types: Marijuana    Home Medications Prior to Admission medications   Medication Sig Start Date End Date Taking? Authorizing Provider  oxyCODONE-acetaminophen (PERCOCET) 5-325 MG tablet Take 2 tablets by mouth every 6 (six) hours as needed for severe pain. 04/01/21  Yes Glennette Galster, Barbara Cower, MD  amLODipine (NORVASC) 10 MG tablet Take 1 tablet (10 mg total) by mouth daily. 12/15/20   Georgina Quint, MD  citalopram (CELEXA) 10 MG tablet Take 1 tablet (10 mg total) by mouth daily. 09/02/19   Chales Abrahams, NP  citalopram (CELEXA) 40 MG tablet TAKE 1/2 TABLET(20 MG) BY MOUTH  DAILY 01/26/21   Georgina Quint, MD  fluvoxaMINE (LUVOX) 100 MG tablet Take 100 mg by mouth at bedtime.    [provider]  lamoTRIgine (LAMICTAL) 100 MG tablet Take 100 mg by mouth daily. 08/26/19   [provider]  losartan-hydrochlorothiazide (HYZAAR) 100-12.5 MG tablet Take 1 tablet by mouth daily. 03/13/21   Georgina Quint, MD    Allergies    Patient has no known allergies.  Review of Systems   Review of Systems  All other systems reviewed and are negative.  Physical Exam Updated Vital Signs BP (!) 185/100   Pulse 92   Temp 99.1 F (37.3 C)   Resp 17   SpO2 98%   Physical Exam Vitals and nursing note reviewed.  Constitutional:      Appearance: He is well-developed.  HENT:     Head: Normocephalic and atraumatic.     Nose: No congestion or rhinorrhea.     Mouth/Throat:      Mouth: Mucous membranes are moist.     Pharynx: Oropharynx is clear.  Eyes:     Pupils: Pupils are equal, round, and reactive to light.  Cardiovascular:     Rate and Rhythm: Normal rate.  Pulmonary:     Effort: Pulmonary effort is normal. No respiratory distress.  Abdominal:     General: Abdomen is flat. There is no distension.  Musculoskeletal:        General: Tenderness (medial right knee) present. No swelling. Normal range of motion.     Cervical back: Normal range of motion.  Skin:    General: Skin is warm and dry.     Coloration: Skin is not jaundiced or pale.  Neurological:     General: No focal deficit present.     Mental Status: He is alert.    ED Results / Procedures / Treatments   Labs (all labs ordered are listed, but only abnormal results are displayed) Labs Reviewed - No data to display  EKG None  Radiology DG Knee Complete 4 Views Right  Result Date: 03/31/2021 CLINICAL DATA:  Right medial knee pain after injury on Sunday. Worsening pain with limited range of motion. EXAM: RIGHT KNEE - COMPLETE 4+ VIEW COMPARISON:  None. FINDINGS: No evidence of fracture, dislocation, or joint effusion. No evidence of arthropathy or other focal bone abnormality. Soft tissues are unremarkable. IMPRESSION: Negative. Electronically Signed   By: Burman Nieves M.D.   On: 03/31/2021 17:22    Procedures Procedures   Medications Ordered in ED Medications  oxyCODONE-acetaminophen (PERCOCET/ROXICET) 5-325 MG per tablet 2 tablet (has no administration in time range)  ketorolac (TORADOL) injection 30 mg (has no administration in time range)    ED Course  I have reviewed the triage vital signs and the nursing notes.  Pertinent labs & imaging results that were available during my care of the patient were reviewed by me and considered in my medical decision making (see chart for details).    MDM Rules/Calculators/A&P                          Difficult to fully assess 2/2  pain and body habitus however I suspect he is a ligamentous injury of some sort. Will immobilize. Crutches. RICE therapy. If not improving 3-5 days, needs ortho/PCP follow up for likely MRI.   Final Clinical Impression(s) / ED Diagnoses Final diagnoses:  Acute pain of right knee  Injury of right knee, initial encounter  Rx / DC Orders ED Discharge Orders          Ordered    oxyCODONE-acetaminophen (PERCOCET) 5-325 MG tablet  Every 6 hours PRN        04/01/21 0213             Densel Kronick, Barbara Cower, MD 04/01/21 6144

## 2021-06-02 ENCOUNTER — Other Ambulatory Visit: Payer: Self-pay | Admitting: Emergency Medicine

## 2021-06-02 DIAGNOSIS — F39 Unspecified mood [affective] disorder: Secondary | ICD-10-CM

## 2021-06-23 ENCOUNTER — Encounter: Payer: Self-pay | Admitting: Emergency Medicine

## 2021-06-23 ENCOUNTER — Other Ambulatory Visit: Payer: Self-pay

## 2021-06-23 ENCOUNTER — Ambulatory Visit: Payer: BC Managed Care – PPO | Admitting: Emergency Medicine

## 2021-06-23 VITALS — BP 168/90 | HR 93 | Temp 98.5°F | Ht 78.0 in | Wt 384.0 lb

## 2021-06-23 DIAGNOSIS — Z6841 Body Mass Index (BMI) 40.0 and over, adult: Secondary | ICD-10-CM

## 2021-06-23 DIAGNOSIS — H539 Unspecified visual disturbance: Secondary | ICD-10-CM | POA: Diagnosis not present

## 2021-06-23 DIAGNOSIS — F429 Obsessive-compulsive disorder, unspecified: Secondary | ICD-10-CM

## 2021-06-23 DIAGNOSIS — I10 Essential (primary) hypertension: Secondary | ICD-10-CM | POA: Diagnosis not present

## 2021-06-23 DIAGNOSIS — Z23 Encounter for immunization: Secondary | ICD-10-CM

## 2021-06-23 DIAGNOSIS — F17200 Nicotine dependence, unspecified, uncomplicated: Secondary | ICD-10-CM | POA: Diagnosis not present

## 2021-06-23 MED ORDER — TRAZODONE HCL 50 MG PO TABS: 50.0000 mg | ORAL_TABLET | Freq: Every evening | ORAL | 3 refills | Status: AC | PRN

## 2021-06-23 MED ORDER — AMLODIPINE BESYLATE 10 MG PO TABS: 10.0000 mg | ORAL_TABLET | Freq: Every day | ORAL | 3 refills | Status: DC

## 2021-06-23 MED ORDER — NICOTINE 14 MG/24HR TD PT24: 14.0000 mg | MEDICATED_PATCH | Freq: Every day | TRANSDERMAL | 3 refills | Status: AC

## 2021-06-23 NOTE — Assessment & Plan Note (Signed)
Diet and nutrition discussed.  Blood work done today. 

## 2021-06-23 NOTE — Patient Instructions (Signed)

## 2021-06-23 NOTE — Assessment & Plan Note (Signed)
Started smoking again.  Wants to quit.  Requesting NicoDerm patches. Smoking cessation advice given.  Cardiovascular and cancer risks associated with smoking discussed.

## 2021-06-23 NOTE — Assessment & Plan Note (Signed)
Urgent referral to ophthalmologist placed today.

## 2021-06-23 NOTE — Progress Notes (Signed)
Langley GaussBryan Hunger 35 y.o.   Chief Complaint  Patient presents with   Medication Refill    Celexa   Headache    Pt states that he has an headache x 2 days. Pt states he has a little blurred vision out right eye.    HISTORY OF PRESENT ILLNESS: This is a 35 y.o. male with history of OCD and hypertension here for follow-up. Presently on Celexa 40 mg daily.  Needs medication refill.  Requesting trazodone 50 mg for bedtime. History of hypertension taking Hyzaar 100-12.5 mg daily.  Not taking amlodipine 10 mg tablet as he was advised to. Has been having headache for 2 days with mild blurred vision from the right eye.  Denies syncope or any other associated symptoms. Smoker.  Wants to quit. No other complaints or medical concerns today.  Medication Refill Associated symptoms include headaches. Pertinent negatives include no chest pain, chills, congestion, coughing, fever, rash, sore throat or weakness.  Headache  Associated symptoms include blurred vision (Peripheral blurriness around the clear center from right eye). Pertinent negatives include no coughing, eye pain, eye redness, fever, photophobia, seizures, sore throat or weakness.    Prior to Admission medications   Medication Sig Start Date End Date Taking? Authorizing Provider  citalopram (CELEXA) 40 MG tablet TAKE 1/2 TABLET(20 MG) BY MOUTH DAILY 06/02/21  Yes Tersea Aulds, Eilleen KempfMiguel Jose, MD  traZODone (DESYREL) 50 MG tablet Take 1 tablet (50 mg total) by mouth at bedtime as needed for sleep. 06/23/21 09/21/21 Yes Sanyia Dini, Eilleen KempfMiguel Jose, MD  amLODipine (NORVASC) 10 MG tablet Take 1 tablet (10 mg total) by mouth daily. 06/23/21   Georgina QuintSagardia, Zemirah Krasinski Jose, MD  lamoTRIgine (LAMICTAL) 100 MG tablet Take 100 mg by mouth daily. Patient not taking: Reported on 06/23/2021 08/26/19   [provider]  losartan-hydrochlorothiazide (HYZAAR) 100-12.5 MG tablet Take 1 tablet by mouth daily. Patient not taking: Reported on 06/23/2021 03/13/21   Georgina QuintSagardia, Ryan Palermo  Jose, MD  oxyCODONE-acetaminophen (PERCOCET) 5-325 MG tablet Take 2 tablets by mouth every 6 (six) hours as needed for severe pain. Patient not taking: Reported on 06/23/2021 04/01/21   Mesner, Barbara CowerJason, MD    No Known Allergies  Patient Active Problem List   Diagnosis Date Noted   Obsessive compulsive disorder 09/01/2019   MDD (major depressive disorder), recurrent episode, severe (HCC) 08/31/2019   Obstructive sleep apnea treated with continuous positive airway pressure (CPAP) 12/06/2018   Hypertension 01/05/2018   Mood disorder (HCC) 01/05/2018   Morbid obesity with BMI of 50.0-59.9, adult (HCC) 01/05/2018   Prediabetes 01/05/2018   Bipolar I disorder (HCC) 01/29/2014    Past Medical History:  Diagnosis Date   Anger    Anxiety    Depression    Hiatal hernia    Hypertension    Mood changes    Obsessive compulsive disorder     No past surgical history on file.  Social History   Socioeconomic History   Marital status: Married    Spouse name: Not on file   Number of children: Not on file   Years of education: Not on file   Highest education level: Not on file  Occupational History   Not on file  Tobacco Use   Smoking status: Former    Packs/day: 1.00    Types: Cigarettes   Smokeless tobacco: Former    Types: Associate ProfessorChew  Vaping Use   Vaping Use: Every day  Substance and Sexual Activity   Alcohol use: No    Comment: socially   Drug  use: No    Types: Marijuana   Sexual activity: Yes  Other Topics Concern   Not on file  Social History Narrative   Not on file   Social Determinants of Health   Financial Resource Strain: Not on file  Food Insecurity: Not on file  Transportation Needs: Not on file  Physical Activity: Not on file  Stress: Not on file  Social Connections: Not on file  Intimate Partner Violence: Not on file    Family History  Problem Relation Age of Onset   OCD Mother    Alcohol abuse Father    Bipolar disorder Father    Drug abuse Father     Alcohol abuse Maternal Uncle    Alcohol abuse Paternal Uncle    OCD Maternal Grandfather    Alcohol abuse Paternal Grandmother    Drug abuse Paternal Grandmother      Review of Systems  Constitutional: Negative.  Negative for chills and fever.  HENT: Negative.  Negative for congestion and sore throat.   Eyes:  Positive for blurred vision (Peripheral blurriness around the clear center from right eye). Negative for double vision, photophobia, pain, discharge and redness.  Respiratory: Negative.  Negative for cough and shortness of breath.   Cardiovascular:  Negative for chest pain and palpitations.  Genitourinary: Negative.   Musculoskeletal: Negative.   Skin: Negative.  Negative for rash.  Neurological:  Positive for headaches. Negative for sensory change, speech change, focal weakness, seizures, loss of consciousness and weakness.  All other systems reviewed and are negative.  Today's Vitals   06/23/21 1603  BP: (!) 168/90  Pulse: 93  Temp: 98.5 F (36.9 C)  TempSrc: Oral  SpO2: 96%  Weight: (!) 384 lb (174.2 kg)  Height:  (1.981 m)   Body mass index is 44.38 kg/m.  Physical Exam Vitals reviewed.  Constitutional:      Appearance: He is well-developed. He is obese.  HENT:     Head: Normocephalic and atraumatic.  Eyes:     Extraocular Movements: Extraocular movements intact.     Conjunctiva/sclera: Conjunctivae normal.     Pupils: Pupils are equal, round, and reactive to light.  Neck:     Vascular: No carotid bruit.  Cardiovascular:     Rate and Rhythm: Normal rate and regular rhythm.     Pulses: Normal pulses.     Heart sounds: Normal heart sounds.  Pulmonary:     Effort: Pulmonary effort is normal.     Breath sounds: Normal breath sounds.  Musculoskeletal:        General: Normal range of motion.     Cervical back: Normal range of motion and neck supple. No tenderness.     Right lower leg: No edema.     Left lower leg: No edema.  Skin:    General: Skin  is warm and dry.  Neurological:     General: No focal deficit present.     Mental Status: He is alert and oriented to person, place, and time.  Psychiatric:        Mood and Affect: Mood normal.        Behavior: Behavior normal.     ASSESSMENT & PLAN: Uncontrolled hypertension Presently taking Hyzaar 100-12.5 mg tablet.  Not taking amlodipine 10 mg as prescribed.  Most start amlodipine 10 mg daily today.  Dietary approaches to stop hypertension discussed.  Headache and visual problems most likely related to uncontrolled hypertension.  Visual changes Urgent referral to ophthalmologist placed today.  Smoker Started smoking again.  Wants to quit.  Requesting NicoDerm patches. Smoking cessation advice given.  Cardiovascular and cancer risks associated with smoking discussed.  Morbid obesity with BMI of 50.0-59.9, adult (HCC) Diet and nutrition discussed.  Blood work done today. Maleki was seen today for medication refill and headache.  Diagnoses and all orders for this visit:  Uncontrolled hypertension -     amLODipine (NORVASC) 10 MG tablet; Take 1 tablet (10 mg total) by mouth daily. -     Ambulatory referral to Ophthalmology  Essential hypertension -     amLODipine (NORVASC) 10 MG tablet; Take 1 tablet (10 mg total) by mouth daily. -     CBC with Differential/Platelet -     Comprehensive metabolic panel -     Hemoglobin A1c -     Lipid panel  Visual changes -     Ambulatory referral to Ophthalmology  Smoker -     nicotine (NICODERM CQ) 14 mg/24hr patch; Place 1 patch (14 mg total) onto the skin daily.  Obsessive-compulsive disorder, unspecified type  Morbid obesity with BMI of 50.0-59.9, adult (HCC)  Other orders -     Flu Vaccine QUAD 66mo+IM (Fluarix, Fluzone & Alfiuria Quad PF) -     traZODone (DESYREL) 50 MG tablet; Take 1 tablet (50 mg total) by mouth at bedtime as needed for sleep.  Patient Instructions  Hypertension, Adult High blood pressure (hypertension) is  when the force of blood pumping through the arteries is too strong. The arteries are the blood vessels that carry blood from the heart throughout the body. Hypertension forces the heart to work harder to pump blood and may cause arteries to become narrow or stiff. Untreated or uncontrolled hypertension can cause a heart attack, heart failure, a stroke, kidney disease, and other problems. A blood pressure reading consists of a higher number over a lower number. Ideally, your blood pressure should be below 120/80. The first ("top") number is called the systolic pressure. It is a measure of the pressure in your arteries as your heart beats. The second ("bottom") number is called the diastolic pressure. It is a measure of the pressure in your arteries as the heart relaxes. What are the causes? The exact cause of this condition is not known. There are some conditions that result in or are related to high blood pressure. What increases the risk? Some risk factors for high blood pressure are under your control. The following factors may make you more likely to develop this condition: Smoking. Having type 2 diabetes mellitus, high cholesterol, or both. Not getting enough exercise or physical activity. Being overweight. Having too much fat, sugar, calories, or salt (sodium) in your diet. Drinking too much alcohol. Some risk factors for high blood pressure may be difficult or impossible to change. Some of these factors include: Having chronic kidney disease. Having a family history of high blood pressure. Age. Risk increases with age. Race. You may be at higher risk if you are African American. Gender. Men are at higher risk than women before age 44. After age 18, women are at higher risk than men. Having obstructive sleep apnea. Stress. What are the signs or symptoms? High blood pressure may not cause symptoms. Very high blood pressure (hypertensive crisis) may cause: Headache. Anxiety. Shortness of  breath. Nosebleed. Nausea and vomiting. Vision changes. Severe chest pain. Seizures. How is this diagnosed? This condition is diagnosed by measuring your blood pressure while you are seated, with your arm resting on a  flat surface, your legs uncrossed, and your feet flat on the floor. The cuff of the blood pressure monitor will be placed directly against the skin of your upper arm at the level of your heart. It should be measured at least twice using the same arm. Certain conditions can cause a difference in blood pressure between your right and left arms. Certain factors can cause blood pressure readings to be lower or higher than normal for a short period of time: When your blood pressure is higher when you are in a health care provider's office than when you are at home, this is called white coat hypertension. Most people with this condition do not need medicines. When your blood pressure is higher at home than when you are in a health care provider's office, this is called masked hypertension. Most people with this condition may need medicines to control blood pressure. If you have a high blood pressure reading during one visit or you have normal blood pressure with other risk factors, you may be asked to: Return on a different day to have your blood pressure checked again. Monitor your blood pressure at home for 1 week or longer. If you are diagnosed with hypertension, you may have other blood or imaging tests to help your health care provider understand your overall risk for other conditions. How is this treated? This condition is treated by making healthy lifestyle changes, such as eating healthy foods, exercising more, and reducing your alcohol intake. Your health care provider may prescribe medicine if lifestyle changes are not enough to get your blood pressure under control, and if: Your systolic blood pressure is above 130. Your diastolic blood pressure is above 80. Your personal target  blood pressure may vary depending on your medical conditions, your age, and other factors. Follow these instructions at home: Eating and drinking  Eat a diet that is high in fiber and potassium, and low in sodium, added sugar, and fat. An example eating plan is called the DASH (Dietary Approaches to Stop Hypertension) diet. To eat this way: Eat plenty of fresh fruits and vegetables. Try to fill one half of your plate at each meal with fruits and vegetables. Eat whole grains, such as whole-wheat pasta, brown rice, or whole-grain bread. Fill about one fourth of your plate with whole grains. Eat or drink low-fat dairy products, such as skim milk or low-fat yogurt. Avoid fatty cuts of meat, processed or cured meats, and poultry with skin. Fill about one fourth of your plate with lean proteins, such as fish, chicken without skin, beans, eggs, or tofu. Avoid pre-made and processed foods. These tend to be higher in sodium, added sugar, and fat. Reduce your daily sodium intake. Most people with hypertension should eat less than 1,500 mg of sodium a day. Do not drink alcohol if: Your health care provider tells you not to drink. You are pregnant, may be pregnant, or are planning to become pregnant. If you drink alcohol: Limit how much you use to: 0-1 drink a day for women. 0-2 drinks a day for men. Be aware of how much alcohol is in your drink. In the [place] one drink equals one 12 oz bottle of beer (355 mL), one 5 oz glass of wine (148 mL), or one 1 oz glass of hard liquor (44 mL). Lifestyle  Work with your health care provider to maintain a healthy body weight or to lose weight. Ask what an ideal weight is for you. Get at least 30 minutes of  exercise most days of the week. Activities may include walking, swimming, or biking. Include exercise to strengthen your muscles (resistance exercise), such as Pilates or lifting weights, as part of your weekly exercise routine. Try to do these types of exercises  for 30 minutes at least 3 days a week. Do not use any products that contain nicotine or tobacco, such as cigarettes, e-cigarettes, and chewing tobacco. If you need help quitting, ask your health care provider. Monitor your blood pressure at home as told by your health care provider. Keep all follow-up visits as told by your health care provider. This is important. Medicines Take over-the-counter and prescription medicines only as told by your health care provider. Follow directions carefully. Blood pressure medicines must be taken as prescribed. Do not skip doses of blood pressure medicine. Doing this puts you at risk for problems and can make the medicine less effective. Ask your health care provider about side effects or reactions to medicines that you should watch for. Contact a health care provider if you: Think you are having a reaction to a medicine you are taking. Have headaches that keep coming back (recurring). Feel dizzy. Have swelling in your ankles. Have trouble with your vision. Get help right away if you: Develop a severe headache or confusion. Have unusual weakness or numbness. Feel faint. Have severe pain in your chest or abdomen. Vomit repeatedly. Have trouble breathing. Summary Hypertension is when the force of blood pumping through your arteries is too strong. If this condition is not controlled, it may put you at risk for serious complications. Your personal target blood pressure may vary depending on your medical conditions, your age, and other factors. For most people, a normal blood pressure is less than 120/80. Hypertension is treated with lifestyle changes, medicines, or a combination of both. Lifestyle changes include losing weight, eating a healthy, low-sodium diet, exercising more, and limiting alcohol. This information is not intended to replace advice given to you by your health care provider. Make sure you discuss any questions you have with your health care  provider. Document Revised: 06/14/2018 Document Reviewed: 06/14/2018 Elsevier Patient Education  2022 Elsevier Inc.    Edwina Barth, MD [facility]

## 2021-06-23 NOTE — Assessment & Plan Note (Addendum)
Presently taking Hyzaar 100-12.5 mg tablet.  Not taking amlodipine 10 mg as prescribed.  Most start amlodipine 10 mg daily today.  Dietary approaches to stop hypertension discussed.  Headache and visual problems most likely related to uncontrolled hypertension.

## 2021-07-01 DIAGNOSIS — U071 COVID-19: Secondary | ICD-10-CM | POA: Diagnosis not present

## 2021-07-03 DIAGNOSIS — G4733 Obstructive sleep apnea (adult) (pediatric): Secondary | ICD-10-CM | POA: Diagnosis not present

## 2021-07-05 DIAGNOSIS — Z20822 Contact with and (suspected) exposure to covid-19: Secondary | ICD-10-CM | POA: Diagnosis not present

## 2021-07-29 ENCOUNTER — Telehealth: Payer: Self-pay | Admitting: Emergency Medicine

## 2021-07-29 ENCOUNTER — Other Ambulatory Visit: Payer: Self-pay | Admitting: Emergency Medicine

## 2021-07-29 MED ORDER — CYCLOBENZAPRINE HCL 10 MG PO TABS: 10.0000 mg | ORAL_TABLET | Freq: Three times a day (TID) | ORAL | 0 refills | Status: DC | PRN

## 2021-07-29 NOTE — Telephone Encounter (Signed)
Thank you Byrd Hesselbach.  New prescription for Flexeril sent to pharmacy of record.

## 2021-07-29 NOTE — Telephone Encounter (Addendum)
   Patient requesting order for Flexeril  Not on med list Pharmacy [facility] [phone] - [place], [place] - [address]

## 2021-08-02 DIAGNOSIS — G4733 Obstructive sleep apnea (adult) (pediatric): Secondary | ICD-10-CM | POA: Diagnosis not present

## 2021-08-24 DIAGNOSIS — F429 Obsessive-compulsive disorder, unspecified: Secondary | ICD-10-CM | POA: Diagnosis not present

## 2021-08-24 DIAGNOSIS — F411 Generalized anxiety disorder: Secondary | ICD-10-CM | POA: Diagnosis not present

## 2021-08-24 DIAGNOSIS — F319 Bipolar disorder, unspecified: Secondary | ICD-10-CM | POA: Diagnosis not present

## 2021-08-24 DIAGNOSIS — F4310 Post-traumatic stress disorder, unspecified: Secondary | ICD-10-CM | POA: Diagnosis not present

## 2021-08-25 ENCOUNTER — Other Ambulatory Visit: Payer: Self-pay | Admitting: Emergency Medicine

## 2021-09-02 DIAGNOSIS — G4733 Obstructive sleep apnea (adult) (pediatric): Secondary | ICD-10-CM | POA: Diagnosis not present

## 2021-09-21 DIAGNOSIS — F411 Generalized anxiety disorder: Secondary | ICD-10-CM | POA: Diagnosis not present

## 2021-09-21 DIAGNOSIS — F429 Obsessive-compulsive disorder, unspecified: Secondary | ICD-10-CM | POA: Diagnosis not present

## 2021-09-21 DIAGNOSIS — F319 Bipolar disorder, unspecified: Secondary | ICD-10-CM | POA: Diagnosis not present

## 2021-09-21 DIAGNOSIS — F4310 Post-traumatic stress disorder, unspecified: Secondary | ICD-10-CM | POA: Diagnosis not present

## 2021-11-04 DIAGNOSIS — F411 Generalized anxiety disorder: Secondary | ICD-10-CM | POA: Diagnosis not present

## 2021-11-04 DIAGNOSIS — F319 Bipolar disorder, unspecified: Secondary | ICD-10-CM | POA: Diagnosis not present

## 2021-11-04 DIAGNOSIS — F4310 Post-traumatic stress disorder, unspecified: Secondary | ICD-10-CM | POA: Diagnosis not present

## 2021-11-04 DIAGNOSIS — F429 Obsessive-compulsive disorder, unspecified: Secondary | ICD-10-CM | POA: Diagnosis not present

## 2021-12-17 ENCOUNTER — Other Ambulatory Visit: Payer: Self-pay | Admitting: Emergency Medicine

## 2021-12-17 DIAGNOSIS — I10 Essential (primary) hypertension: Secondary | ICD-10-CM

## 2021-12-18 DIAGNOSIS — D2361 Other benign neoplasm of skin of right upper limb, including shoulder: Secondary | ICD-10-CM | POA: Diagnosis not present

## 2021-12-18 DIAGNOSIS — D2261 Melanocytic nevi of right upper limb, including shoulder: Secondary | ICD-10-CM | POA: Diagnosis not present

## 2021-12-18 DIAGNOSIS — Z1283 Encounter for screening for malignant neoplasm of skin: Secondary | ICD-10-CM | POA: Diagnosis not present

## 2021-12-18 DIAGNOSIS — D2262 Melanocytic nevi of left upper limb, including shoulder: Secondary | ICD-10-CM | POA: Diagnosis not present

## 2021-12-18 DIAGNOSIS — D485 Neoplasm of uncertain behavior of skin: Secondary | ICD-10-CM | POA: Diagnosis not present

## 2021-12-18 DIAGNOSIS — L918 Other hypertrophic disorders of the skin: Secondary | ICD-10-CM | POA: Diagnosis not present

## 2021-12-30 DIAGNOSIS — F319 Bipolar disorder, unspecified: Secondary | ICD-10-CM | POA: Diagnosis not present

## 2021-12-30 DIAGNOSIS — F411 Generalized anxiety disorder: Secondary | ICD-10-CM | POA: Diagnosis not present

## 2021-12-30 DIAGNOSIS — F4310 Post-traumatic stress disorder, unspecified: Secondary | ICD-10-CM | POA: Diagnosis not present

## 2021-12-30 DIAGNOSIS — F429 Obsessive-compulsive disorder, unspecified: Secondary | ICD-10-CM | POA: Diagnosis not present

## 2022-01-18 ENCOUNTER — Ambulatory Visit: Payer: BC Managed Care – PPO | Admitting: Internal Medicine

## 2022-01-18 ENCOUNTER — Encounter: Payer: Self-pay | Admitting: Internal Medicine

## 2022-01-18 VITALS — BP 136/82 | HR 91 | Temp 97.7°F | Ht 78.0 in | Wt >= 6400 oz

## 2022-01-18 DIAGNOSIS — J029 Acute pharyngitis, unspecified: Secondary | ICD-10-CM | POA: Diagnosis not present

## 2022-01-18 DIAGNOSIS — H109 Unspecified conjunctivitis: Secondary | ICD-10-CM | POA: Diagnosis not present

## 2022-01-18 DIAGNOSIS — J0100 Acute maxillary sinusitis, unspecified: Secondary | ICD-10-CM

## 2022-01-18 DIAGNOSIS — I10 Essential (primary) hypertension: Secondary | ICD-10-CM

## 2022-01-18 DIAGNOSIS — B9689 Other specified bacterial agents as the cause of diseases classified elsewhere: Secondary | ICD-10-CM | POA: Insufficient documentation

## 2022-01-18 LAB — POC COVID19 BINAXNOW: SARS Coronavirus 2 Ag: NEGATIVE

## 2022-01-18 LAB — POCT RAPID STREP A (OFFICE): Rapid Strep A Screen: NEGATIVE

## 2022-01-18 MED ORDER — AMOXICILLIN-POT CLAVULANATE 875-125 MG PO TABS: 1.0000 | ORAL_TABLET | Freq: Two times a day (BID) | ORAL | 0 refills | Status: AC

## 2022-01-18 MED ORDER — NEOMYCIN-POLYMYXIN-DEXAMETH 3.5-10000-0.1 OP SUSP: 2.0000 [drp] | Freq: Four times a day (QID) | OPHTHALMIC | 0 refills | Status: AC

## 2022-01-18 NOTE — Progress Notes (Signed)
? ?Subjective:  ?Patient ID: Clinton Taylor, male    DOB: May 01, 1986  Age: 36 y.o. MRN: [id number] ? ?CC: No chief complaint on file. ? ? ?HPI ?Clinton Taylor presents for f/up - He has been sick for about a week with cough, shortness of breath, thick yellow-green nasal phlegm, and now for 2 days with red, itchy, painful eyes with discharge.  His son has pinkeye. ? ?Outpatient Medications Prior to Visit  ?Medication Sig Dispense Refill  ? amLODipine (NORVASC) 10 MG tablet TAKE 1 TABLET(10 MG) BY MOUTH DAILY 90 tablet 3  ? citalopram (CELEXA) 40 MG tablet TAKE 1/2 TABLET(20 MG) BY MOUTH DAILY 45 tablet 1  ? cyclobenzaprine (FLEXERIL) 10 MG tablet TAKE 1 TABLET(10 MG) BY MOUTH THREE TIMES DAILY AS NEEDED FOR MUSCLE SPASMS 30 tablet 0  ? lamoTRIgine (LAMICTAL) 100 MG tablet Take 100 mg by mouth daily.    ? losartan-hydrochlorothiazide (HYZAAR) 100-12.5 MG tablet Take 1 tablet by mouth daily. 90 tablet 3  ? nicotine (NICODERM CQ) 14 mg/24hr patch Place 1 patch (14 mg total) onto the skin daily. 28 patch 3  ? oxyCODONE-acetaminophen (PERCOCET) 5-325 MG tablet Take 2 tablets by mouth every 6 (six) hours as needed for severe pain. 20 tablet 0  ? traZODone (DESYREL) 50 MG tablet Take 1 tablet (50 mg total) by mouth at bedtime as needed for sleep. 30 tablet 3  ? ?No facility-administered medications prior to visit.  ? ? ?ROS ?Review of Systems  ?Constitutional:  Positive for unexpected weight change (wt gain). Negative for appetite change, chills, diaphoresis and fatigue.  ?HENT:  Positive for congestion, postnasal drip, rhinorrhea, sinus pressure, sinus pain and sore throat. Negative for facial swelling, nosebleeds, trouble swallowing and voice change.   ?Eyes:  Positive for pain, discharge, redness and itching. Negative for photophobia and visual disturbance.  ?Respiratory:  Positive for apnea, cough and shortness of breath. Negative for choking, chest tightness, wheezing and stridor.   ?Cardiovascular:  Negative for  chest pain, palpitations and leg swelling.  ?Gastrointestinal:  Negative for abdominal pain, constipation, diarrhea, nausea and vomiting.  ?Genitourinary: Negative.   ?Musculoskeletal: Negative.   ?Skin: Negative.  Negative for rash.  ?Neurological: Negative.  Negative for dizziness, weakness and headaches.  ?Hematological:  Negative for adenopathy. Does not bruise/bleed easily.  ?Psychiatric/Behavioral: Negative.    ? ?Objective:  ?BP 136/82 (BP Location: Right Arm, Patient Position: Sitting, Cuff Size: Large)   Pulse 91   Temp 97.7 ?F (36.5 ?C) (Oral)   Ht 6\' 6"  (1.981 m)   Wt (!) 402 lb (182.3 kg)   SpO2 96%   BMI 46.46 kg/m?  ? ?BP Readings from Last 3 Encounters:  ?01/18/22 136/82  ?06/23/21 (!) 168/90  ?04/01/21 (!) 198/160  ? ? ?Wt Readings from Last 3 Encounters:  ?01/18/22 (!) 402 lb (182.3 kg)  ?06/23/21 (!) 384 lb (174.2 kg)  ?12/15/20 (!) 381 lb (172.8 kg)  ? ? ?Physical Exam ?Vitals reviewed.  ?Constitutional:   ?   General: He is not in acute distress. ?   Appearance: He is obese. He is ill-appearing. He is not toxic-appearing or diaphoretic.  ?HENT:  ?   Right Ear: Hearing, tympanic membrane, ear canal and external ear normal.  ?   Left Ear: Hearing, tympanic membrane, ear canal and external ear normal.  ?   Nose: Rhinorrhea present. No mucosal edema or congestion. Rhinorrhea is purulent.  ?   Mouth/Throat:  ?   Mouth: Mucous membranes are moist.  ?Eyes:  ?  General: No scleral icterus.    ?   Right eye: Discharge present. No foreign body or hordeolum.     ?   Left eye: Discharge present.No foreign body or hordeolum.  ?   Conjunctiva/sclera:  ?   Right eye: Right conjunctiva is injected. Exudate present. No chemosis or hemorrhage. ?   Left eye: Left conjunctiva is injected. Exudate present. No chemosis or hemorrhage. ?Neck:  ?   Thyroid: No thyromegaly.  ?Cardiovascular:  ?   Rate and Rhythm: Normal rate and regular rhythm.  ?   Heart sounds: Normal heart sounds, S1 normal and S2 normal. No  murmur heard. ?  No friction rub. No gallop.  ?   Comments: EKG- ?NSR, 87 bpm ??LAE, LAD ?LVH with repol is new ?NO Q waves ?Pulmonary:  ?   Effort: Pulmonary effort is normal.  ?   Breath sounds: No stridor. No wheezing, rhonchi or rales.  ?Abdominal:  ?   General: Abdomen is protuberant. Bowel sounds are normal. There is no distension.  ?   Palpations: Abdomen is soft. There is no hepatomegaly, splenomegaly or mass.  ?   Tenderness: There is no abdominal tenderness.  ?Musculoskeletal:  ?   Right lower leg: No edema.  ?   Left lower leg: No edema.  ?Lymphadenopathy:  ?   Cervical: No cervical adenopathy.  ?Skin: ?   General: Skin is warm and dry.  ?Neurological:  ?   General: No focal deficit present.  ?   Mental Status: Mental status is at baseline.  ?Psychiatric:     ?   Mood and Affect: Mood normal.     ?   Behavior: Behavior normal.  ? ? ?Lab Results  ?Component Value Date  ? WBC 14.0 (H) 12/15/2020  ? HGB 14.9 12/15/2020  ? HCT 43.3 12/15/2020  ? PLT 267 12/15/2020  ? GLUCOSE 79 12/15/2020  ? CHOL 164 12/15/2020  ? TRIG 198 (H) 12/15/2020  ? HDL 26 (L) 12/15/2020  ? LDLCALC 103 (H) 12/15/2020  ? ALT 20 12/15/2020  ? AST 20 12/15/2020  ? NA 142 12/15/2020  ? K 3.6 12/15/2020  ? CL 103 12/15/2020  ? CREATININE 1.15 12/15/2020  ? BUN 15 12/15/2020  ? CO2 23 12/15/2020  ? TSH 1.760 01/04/2018  ? HGBA1C 5.4 12/15/2020  ? ? ?DG Knee Complete 4 Views Right ? ?Result Date: 03/31/2021 ?CLINICAL DATA:  Right medial knee pain after injury on Sunday. Worsening pain with limited range of motion. EXAM: RIGHT KNEE - COMPLETE 4+ VIEW COMPARISON:  None. FINDINGS: No evidence of fracture, dislocation, or joint effusion. No evidence of arthropathy or other focal bone abnormality. Soft tissues are unremarkable. IMPRESSION: Negative. Electronically Signed   By: Burman Nieves M.D.   On: 03/31/2021 17:22  ? ? ?Assessment & Plan:  ? ?Diagnoses and all orders for this visit: ? ?Bacterial conjunctivitis of both eyes ?-      neomycin-polymyxin b-dexamethasone (MAXITROL) 3.5-10000-0.1 SUSP; Place 2 drops into both eyes every 6 (six) hours. ?-     WOUND CULTURE; Future ?-     WOUND CULTURE ? ?Acute non-recurrent maxillary sinusitis ?-     amoxicillin-clavulanate (AUGMENTIN) 875-125 MG tablet; Take 1 tablet by mouth 2 (two) times daily for 10 days. ? ?Pharyngitis, unspecified etiology- Testing for COVID and strep is negative. ?-     POC COVID-19 ?-     POCT rapid strep A ? ?Primary hypertension- His blood pressure is adequately well controlled. ?-  EKG 12-Lead ? ? ?I am having [name] start on neomycin-polymyxin b-dexamethasone and amoxicillin-clavulanate. I am also having him maintain his lamoTRIgine, losartan-hydrochlorothiazide, oxyCODONE-acetaminophen, citalopram, traZODone, nicotine, cyclobenzaprine, and amLODipine. ? ?Meds ordered this encounter  ?Medications  ? neomycin-polymyxin b-dexamethasone (MAXITROL) 3.5-10000-0.1 SUSP  ?  Sig: Place 2 drops into both eyes every 6 (six) hours.  ?  Dispense:  5 mL  ?  Refill:  0  ? amoxicillin-clavulanate (AUGMENTIN) 875-125 MG tablet  ?  Sig: Take 1 tablet by mouth 2 (two) times daily for 10 days.  ?  Dispense:  20 tablet  ?  Refill:  0  ? ? ? ?Follow-up: Return in about 3 weeks (around 02/08/2022). ? ?Scarlette Calico, MD ?

## 2022-01-18 NOTE — Patient Instructions (Signed)
Bacterial Conjunctivitis, Adult Bacterial conjunctivitis is an infection of your conjunctiva. This is the clear membrane that covers the white part of your eye and the inner part of your eyelid. This infection can make your eye: Red or pink. Itchy or irritated. This condition spreads easily from person to person (is contagious) and from one eye to the other eye. What are the causes? This condition is caused by germs (bacteria). You may get the infection if you come into close contact with: A person who has the infection. Items that have germs on them (are contaminated), such as face towels, contact lens solution, or eye makeup. What increases the risk? You are more likely to get this condition if: You have contact with people who have the infection. You wear contact lenses. You have a sinus infection. You have had a recent eye injury or surgery. You have a weak body defense system (immune system). You have dry eyes. What are the signs or symptoms?  Thick, yellowish discharge from the eye. Tearing or watery eyes. Itchy eyes. Burning feeling in your eyes. Eye redness. Swollen eyelids. Blurred vision. How is this treated?  Antibiotic eye drops or ointment. Antibiotic medicine taken by mouth. This is used for infections that do not get better with drops or ointment or that last more than 10 days. Cool, wet cloths placed on the eyes. Artificial tears used 2-6 times a day. Follow these instructions at home: Medicines Take or apply your antibiotic medicine as told by your doctor. Do not stop using it even if you start to feel better. Take or apply over-the-counter and prescription medicines only as told by your doctor. Do not touch your eyelid with the eye-drop bottle or the ointment tube. Managing discomfort Wipe any fluid from your eye with a warm, wet washcloth or a cotton ball. Place a clean, cool, wet cloth on your eye. Do this for 10-20 minutes, 3-4 times a day. General  instructions Do not wear contacts until the infection is gone. Wear glasses until your doctor says it is okay to wear contacts again. Do not wear eye makeup until the infection is gone. Throw away old eye makeup. Change or wash your pillowcase every day. Do not share towels or washcloths. Wash your hands often with soap and water for at least 20 seconds and especially before touching your face or eyes. Use paper towels to dry your hands. Do not touch or rub your eyes. Do not drive or use heavy machinery if your vision is blurred. Contact a doctor if: You have a fever. You do not get better after 10 days. Get help right away if: You have a fever and your symptoms get worse all of a sudden. You have very bad pain when you move your eye. Your face: Hurts. Is red. Is swollen. You have sudden loss of vision. Summary Bacterial conjunctivitis is an infection of your conjunctiva. This infection spreads easily from person to person. Wash your hands often with soap and water for at least 20 seconds and especially before touching your face or eyes. Use paper towels to dry your hands. Take or apply your antibiotic medicine as told by your doctor. Contact a doctor if you have a fever or you do not get better after 10 days. This information is not intended to replace advice given to you by your health care provider. Make sure you discuss any questions you have with your health care provider. Document Revised: 01/14/2021 Document Reviewed: 01/14/2021 Elsevier Patient Education    2022 Elsevier Inc.  

## 2022-01-21 ENCOUNTER — Telehealth: Payer: Self-pay

## 2022-01-21 NOTE — Telephone Encounter (Signed)
CRITICAL VALUE STICKER ? ?CRITICAL VALUE: gram stain showed moderate blood cell seen, no epithelial cells, no organisms seen, light growth of strep pneumoniae ? ?RECEIVER (on-site recipient of call): Ladona Ridgel A. Clovis Riley, CMA ? ?DATE & TIME NOTIFIED: 01/21/22 at 1:25p ? ?MESSENGER (representative from lab): [facility] [employer] [facility] ? ?MD NOTIFIED: yes ? ?TIME OF NOTIFICATION: 1:26p ? ?RESPONSE: pending ? ?

## 2022-01-23 LAB — WOUND CULTURE

## 2022-01-23 NOTE — Telephone Encounter (Signed)
Called patient.  ? ?He notes improvement in overall symptoms and pink eye has resolved.  ? ?No changes in medication indicated ?

## 2022-01-28 DIAGNOSIS — N529 Male erectile dysfunction, unspecified: Secondary | ICD-10-CM | POA: Diagnosis not present

## 2022-01-28 DIAGNOSIS — E038 Other specified hypothyroidism: Secondary | ICD-10-CM | POA: Diagnosis not present

## 2022-01-28 DIAGNOSIS — E291 Testicular hypofunction: Secondary | ICD-10-CM | POA: Diagnosis not present

## 2022-01-28 DIAGNOSIS — E611 Iron deficiency: Secondary | ICD-10-CM | POA: Diagnosis not present

## 2022-02-09 DIAGNOSIS — E291 Testicular hypofunction: Secondary | ICD-10-CM | POA: Diagnosis not present

## 2022-02-09 DIAGNOSIS — E559 Vitamin D deficiency, unspecified: Secondary | ICD-10-CM | POA: Diagnosis not present

## 2022-02-09 DIAGNOSIS — Z6841 Body Mass Index (BMI) 40.0 and over, adult: Secondary | ICD-10-CM | POA: Diagnosis not present

## 2022-02-13 ENCOUNTER — Inpatient Hospital Stay (HOSPITAL_COMMUNITY): Payer: BC Managed Care – PPO

## 2022-02-13 ENCOUNTER — Other Ambulatory Visit: Payer: Self-pay

## 2022-02-13 ENCOUNTER — Emergency Department (HOSPITAL_COMMUNITY): Payer: BC Managed Care – PPO

## 2022-02-13 ENCOUNTER — Inpatient Hospital Stay (HOSPITAL_COMMUNITY)
Admission: EM | Admit: 2022-02-13 | Discharge: 2022-03-18 | DRG: 023 | Disposition: E | Payer: BC Managed Care – PPO | Attending: Internal Medicine | Admitting: Internal Medicine

## 2022-02-13 DIAGNOSIS — R634 Abnormal weight loss: Secondary | ICD-10-CM | POA: Diagnosis present

## 2022-02-13 DIAGNOSIS — N179 Acute kidney failure, unspecified: Secondary | ICD-10-CM | POA: Diagnosis not present

## 2022-02-13 DIAGNOSIS — A4189 Other specified sepsis: Secondary | ICD-10-CM | POA: Diagnosis not present

## 2022-02-13 DIAGNOSIS — I610 Nontraumatic intracerebral hemorrhage in hemisphere, subcortical: Principal | ICD-10-CM | POA: Diagnosis present

## 2022-02-13 DIAGNOSIS — Z7189 Other specified counseling: Secondary | ICD-10-CM | POA: Diagnosis not present

## 2022-02-13 DIAGNOSIS — R739 Hyperglycemia, unspecified: Secondary | ICD-10-CM | POA: Diagnosis present

## 2022-02-13 DIAGNOSIS — I1310 Hypertensive heart and chronic kidney disease without heart failure, with stage 1 through stage 4 chronic kidney disease, or unspecified chronic kidney disease: Secondary | ICD-10-CM | POA: Diagnosis present

## 2022-02-13 DIAGNOSIS — R778 Other specified abnormalities of plasma proteins: Secondary | ICD-10-CM | POA: Diagnosis not present

## 2022-02-13 DIAGNOSIS — Z8249 Family history of ischemic heart disease and other diseases of the circulatory system: Secondary | ICD-10-CM

## 2022-02-13 DIAGNOSIS — Z813 Family history of other psychoactive substance abuse and dependence: Secondary | ICD-10-CM

## 2022-02-13 DIAGNOSIS — U071 COVID-19: Secondary | ICD-10-CM | POA: Diagnosis present

## 2022-02-13 DIAGNOSIS — F32A Depression, unspecified: Secondary | ICD-10-CM | POA: Diagnosis present

## 2022-02-13 DIAGNOSIS — R471 Dysarthria and anarthria: Secondary | ICD-10-CM | POA: Diagnosis present

## 2022-02-13 DIAGNOSIS — G936 Cerebral edema: Secondary | ICD-10-CM | POA: Diagnosis present

## 2022-02-13 DIAGNOSIS — I615 Nontraumatic intracerebral hemorrhage, intraventricular: Principal | ICD-10-CM

## 2022-02-13 DIAGNOSIS — J80 Acute respiratory distress syndrome: Secondary | ICD-10-CM | POA: Diagnosis not present

## 2022-02-13 DIAGNOSIS — Z66 Do not resuscitate: Secondary | ICD-10-CM | POA: Diagnosis not present

## 2022-02-13 DIAGNOSIS — D509 Iron deficiency anemia, unspecified: Secondary | ICD-10-CM | POA: Diagnosis present

## 2022-02-13 DIAGNOSIS — Z9911 Dependence on respirator [ventilator] status: Secondary | ICD-10-CM

## 2022-02-13 DIAGNOSIS — G935 Compression of brain: Secondary | ICD-10-CM | POA: Diagnosis present

## 2022-02-13 DIAGNOSIS — Y848 Other medical procedures as the cause of abnormal reaction of the patient, or of later complication, without mention of misadventure at the time of the procedure: Secondary | ICD-10-CM | POA: Diagnosis not present

## 2022-02-13 DIAGNOSIS — Y92239 Unspecified place in hospital as the place of occurrence of the external cause: Secondary | ICD-10-CM | POA: Diagnosis not present

## 2022-02-13 DIAGNOSIS — E870 Hyperosmolality and hypernatremia: Secondary | ICD-10-CM | POA: Diagnosis not present

## 2022-02-13 DIAGNOSIS — T508X5A Adverse effect of diagnostic agents, initial encounter: Secondary | ICD-10-CM | POA: Diagnosis not present

## 2022-02-13 DIAGNOSIS — F419 Anxiety disorder, unspecified: Secondary | ICD-10-CM | POA: Diagnosis present

## 2022-02-13 DIAGNOSIS — J9690 Respiratory failure, unspecified, unspecified whether with hypoxia or hypercapnia: Secondary | ICD-10-CM | POA: Diagnosis not present

## 2022-02-13 DIAGNOSIS — D6489 Other specified anemias: Secondary | ICD-10-CM | POA: Diagnosis present

## 2022-02-13 DIAGNOSIS — I161 Hypertensive emergency: Secondary | ICD-10-CM | POA: Diagnosis present

## 2022-02-13 DIAGNOSIS — I619 Nontraumatic intracerebral hemorrhage, unspecified: Secondary | ICD-10-CM | POA: Diagnosis present

## 2022-02-13 DIAGNOSIS — R29712 NIHSS score 12: Secondary | ICD-10-CM | POA: Diagnosis present

## 2022-02-13 DIAGNOSIS — G9349 Other encephalopathy: Secondary | ICD-10-CM | POA: Diagnosis present

## 2022-02-13 DIAGNOSIS — F39 Unspecified mood [affective] disorder: Secondary | ICD-10-CM | POA: Diagnosis not present

## 2022-02-13 DIAGNOSIS — B309 Viral conjunctivitis, unspecified: Secondary | ICD-10-CM | POA: Diagnosis present

## 2022-02-13 DIAGNOSIS — F17210 Nicotine dependence, cigarettes, uncomplicated: Secondary | ICD-10-CM | POA: Diagnosis present

## 2022-02-13 DIAGNOSIS — R7401 Elevation of levels of liver transaminase levels: Secondary | ICD-10-CM | POA: Diagnosis not present

## 2022-02-13 DIAGNOSIS — Z811 Family history of alcohol abuse and dependence: Secondary | ICD-10-CM

## 2022-02-13 DIAGNOSIS — Z6841 Body Mass Index (BMI) 40.0 and over, adult: Secondary | ICD-10-CM

## 2022-02-13 DIAGNOSIS — A419 Sepsis, unspecified organism: Secondary | ICD-10-CM | POA: Diagnosis present

## 2022-02-13 DIAGNOSIS — N1411 Contrast-induced nephropathy: Secondary | ICD-10-CM | POA: Diagnosis not present

## 2022-02-13 DIAGNOSIS — Z23 Encounter for immunization: Secondary | ICD-10-CM

## 2022-02-13 DIAGNOSIS — T464X5A Adverse effect of angiotensin-converting-enzyme inhibitors, initial encounter: Secondary | ICD-10-CM | POA: Diagnosis not present

## 2022-02-13 DIAGNOSIS — I255 Ischemic cardiomyopathy: Secondary | ICD-10-CM | POA: Diagnosis not present

## 2022-02-13 DIAGNOSIS — E875 Hyperkalemia: Secondary | ICD-10-CM | POA: Diagnosis not present

## 2022-02-13 DIAGNOSIS — G8101 Flaccid hemiplegia affecting right dominant side: Secondary | ICD-10-CM | POA: Diagnosis present

## 2022-02-13 DIAGNOSIS — R9431 Abnormal electrocardiogram [ECG] [EKG]: Secondary | ICD-10-CM | POA: Diagnosis not present

## 2022-02-13 DIAGNOSIS — J810 Acute pulmonary edema: Secondary | ICD-10-CM | POA: Diagnosis not present

## 2022-02-13 DIAGNOSIS — R29810 Facial weakness: Secondary | ICD-10-CM | POA: Diagnosis present

## 2022-02-13 DIAGNOSIS — E46 Unspecified protein-calorie malnutrition: Secondary | ICD-10-CM | POA: Diagnosis present

## 2022-02-13 DIAGNOSIS — Z818 Family history of other mental and behavioral disorders: Secondary | ICD-10-CM

## 2022-02-13 DIAGNOSIS — R4701 Aphasia: Secondary | ICD-10-CM | POA: Diagnosis present

## 2022-02-13 DIAGNOSIS — I10 Essential (primary) hypertension: Secondary | ICD-10-CM | POA: Diagnosis present

## 2022-02-13 DIAGNOSIS — I618 Other nontraumatic intracerebral hemorrhage: Secondary | ICD-10-CM | POA: Diagnosis not present

## 2022-02-13 DIAGNOSIS — J1282 Pneumonia due to coronavirus disease 2019: Secondary | ICD-10-CM | POA: Diagnosis present

## 2022-02-13 DIAGNOSIS — N1832 Chronic kidney disease, stage 3b: Secondary | ICD-10-CM | POA: Diagnosis present

## 2022-02-13 DIAGNOSIS — E781 Pure hyperglyceridemia: Secondary | ICD-10-CM | POA: Diagnosis present

## 2022-02-13 DIAGNOSIS — E874 Mixed disorder of acid-base balance: Secondary | ICD-10-CM | POA: Diagnosis present

## 2022-02-13 DIAGNOSIS — G932 Benign intracranial hypertension: Secondary | ICD-10-CM | POA: Diagnosis present

## 2022-02-13 DIAGNOSIS — J690 Pneumonitis due to inhalation of food and vomit: Secondary | ICD-10-CM | POA: Diagnosis not present

## 2022-02-13 DIAGNOSIS — E871 Hypo-osmolality and hyponatremia: Secondary | ICD-10-CM | POA: Diagnosis not present

## 2022-02-13 DIAGNOSIS — G4733 Obstructive sleep apnea (adult) (pediatric): Secondary | ICD-10-CM | POA: Diagnosis present

## 2022-02-13 DIAGNOSIS — R1310 Dysphagia, unspecified: Secondary | ICD-10-CM | POA: Diagnosis present

## 2022-02-13 DIAGNOSIS — F429 Obsessive-compulsive disorder, unspecified: Secondary | ICD-10-CM | POA: Diagnosis present

## 2022-02-13 DIAGNOSIS — J9602 Acute respiratory failure with hypercapnia: Secondary | ICD-10-CM | POA: Diagnosis not present

## 2022-02-13 DIAGNOSIS — E039 Hypothyroidism, unspecified: Secondary | ICD-10-CM | POA: Diagnosis present

## 2022-02-13 DIAGNOSIS — N170 Acute kidney failure with tubular necrosis: Secondary | ICD-10-CM | POA: Diagnosis present

## 2022-02-13 DIAGNOSIS — E785 Hyperlipidemia, unspecified: Secondary | ICD-10-CM | POA: Diagnosis present

## 2022-02-13 DIAGNOSIS — T461X5A Adverse effect of calcium-channel blockers, initial encounter: Secondary | ICD-10-CM | POA: Diagnosis not present

## 2022-02-13 DIAGNOSIS — Z515 Encounter for palliative care: Secondary | ICD-10-CM | POA: Diagnosis not present

## 2022-02-13 DIAGNOSIS — R34 Anuria and oliguria: Secondary | ICD-10-CM | POA: Diagnosis present

## 2022-02-13 DIAGNOSIS — E876 Hypokalemia: Secondary | ICD-10-CM | POA: Diagnosis present

## 2022-02-13 DIAGNOSIS — R001 Bradycardia, unspecified: Secondary | ICD-10-CM | POA: Diagnosis not present

## 2022-02-13 DIAGNOSIS — Z79899 Other long term (current) drug therapy: Secondary | ICD-10-CM

## 2022-02-13 DIAGNOSIS — K449 Diaphragmatic hernia without obstruction or gangrene: Secondary | ICD-10-CM | POA: Diagnosis present

## 2022-02-13 DIAGNOSIS — R6521 Severe sepsis with septic shock: Secondary | ICD-10-CM | POA: Diagnosis not present

## 2022-02-13 DIAGNOSIS — I248 Other forms of acute ischemic heart disease: Secondary | ICD-10-CM | POA: Diagnosis present

## 2022-02-13 DIAGNOSIS — I952 Hypotension due to drugs: Secondary | ICD-10-CM | POA: Diagnosis not present

## 2022-02-13 DIAGNOSIS — J9601 Acute respiratory failure with hypoxia: Secondary | ICD-10-CM | POA: Diagnosis not present

## 2022-02-13 HISTORY — DX: Obstructive sleep apnea (adult) (pediatric): G47.33

## 2022-02-13 HISTORY — DX: Morbid (severe) obesity due to excess calories: E66.01

## 2022-02-13 HISTORY — DX: Cardiomegaly: I51.7

## 2022-02-13 LAB — RESP PANEL BY RT-PCR (FLU A&B, COVID) ARPGX2
Influenza A by PCR: NEGATIVE
Influenza B by PCR: NEGATIVE
SARS Coronavirus 2 by RT PCR: POSITIVE — AB

## 2022-02-13 LAB — DIFFERENTIAL
Abs Immature Granulocytes: 0.18 10*3/uL — ABNORMAL HIGH (ref 0.00–0.07)
Basophils Absolute: 0.1 10*3/uL (ref 0.0–0.1)
Basophils Relative: 1 %
Eosinophils Absolute: 0.8 10*3/uL — ABNORMAL HIGH (ref 0.0–0.5)
Eosinophils Relative: 5 %
Immature Granulocytes: 1 %
Lymphocytes Relative: 25 %
Lymphs Abs: 3.8 10*3/uL (ref 0.7–4.0)
Monocytes Absolute: 0.9 10*3/uL (ref 0.1–1.0)
Monocytes Relative: 6 %
Neutro Abs: 9.4 10*3/uL — ABNORMAL HIGH (ref 1.7–7.7)
Neutrophils Relative %: 62 %

## 2022-02-13 LAB — CBC
HCT: 43.6 % (ref 39.0–52.0)
Hemoglobin: 13.9 g/dL (ref 13.0–17.0)
MCH: 25.2 pg — ABNORMAL LOW (ref 26.0–34.0)
MCHC: 31.9 g/dL (ref 30.0–36.0)
MCV: 79 fL — ABNORMAL LOW (ref 80.0–100.0)
Platelets: 275 10*3/uL (ref 150–400)
RBC: 5.52 MIL/uL (ref 4.22–5.81)
RDW: 16.8 % — ABNORMAL HIGH (ref 11.5–15.5)
WBC: 15.2 10*3/uL — ABNORMAL HIGH (ref 4.0–10.5)
nRBC: 0 % (ref 0.0–0.2)

## 2022-02-13 LAB — COMPREHENSIVE METABOLIC PANEL
ALT: 26 U/L (ref 0–44)
AST: 26 U/L (ref 15–41)
Albumin: 4.2 g/dL (ref 3.5–5.0)
Alkaline Phosphatase: 88 U/L (ref 38–126)
Anion gap: 12 (ref 5–15)
BUN: 29 mg/dL — ABNORMAL HIGH (ref 6–20)
CO2: 23 mmol/L (ref 22–32)
Calcium: 8.9 mg/dL (ref 8.9–10.3)
Chloride: 103 mmol/L (ref 98–111)
Creatinine, Ser: 2.59 mg/dL — ABNORMAL HIGH (ref 0.61–1.24)
GFR, Estimated: 32 mL/min — ABNORMAL LOW (ref >60)
Glucose, Bld: 125 mg/dL — ABNORMAL HIGH (ref 70–99)
Potassium: 2.9 mmol/L — ABNORMAL LOW (ref 3.5–5.1)
Sodium: 138 mmol/L (ref 135–145)
Total Bilirubin: 0.8 mg/dL (ref 0.3–1.2)
Total Protein: 8.1 g/dL (ref 6.5–8.1)

## 2022-02-13 LAB — APTT: aPTT: 27 seconds (ref 24–36)

## 2022-02-13 LAB — URINALYSIS, ROUTINE W REFLEX MICROSCOPIC
Bilirubin Urine: NEGATIVE
Glucose, UA: NEGATIVE mg/dL
Ketones, ur: NEGATIVE mg/dL
Leukocytes,Ua: NEGATIVE
Nitrite: NEGATIVE
Protein, ur: >=300 mg/dL — AB
Specific Gravity, Urine: 1.022 (ref 1.005–1.030)
pH: 5 (ref 5.0–8.0)

## 2022-02-13 LAB — HEMOGLOBIN A1C
Hgb A1c MFr Bld: 5.5 % (ref 4.8–5.6)
Mean Plasma Glucose: 111.15 mg/dL

## 2022-02-13 LAB — BASIC METABOLIC PANEL
Anion gap: 10 (ref 5–15)
BUN: 25 mg/dL — ABNORMAL HIGH (ref 6–20)
CO2: 25 mmol/L (ref 22–32)
Calcium: 8.8 mg/dL — ABNORMAL LOW (ref 8.9–10.3)
Chloride: 102 mmol/L (ref 98–111)
Creatinine, Ser: 2.72 mg/dL — ABNORMAL HIGH (ref 0.61–1.24)
GFR, Estimated: 30 mL/min — ABNORMAL LOW (ref >60)
Glucose, Bld: 119 mg/dL — ABNORMAL HIGH (ref 70–99)
Potassium: 3.4 mmol/L — ABNORMAL LOW (ref 3.5–5.1)
Sodium: 137 mmol/L (ref 135–145)

## 2022-02-13 LAB — BRAIN NATRIURETIC PEPTIDE: B Natriuretic Peptide: 343.3 pg/mL — ABNORMAL HIGH (ref 0.0–100.0)

## 2022-02-13 LAB — PROTIME-INR
INR: 1 (ref 0.8–1.2)
Prothrombin Time: 13.3 seconds (ref 11.4–15.2)

## 2022-02-13 LAB — FERRITIN: Ferritin: 80 ng/mL (ref 24–336)

## 2022-02-13 LAB — RAPID URINE DRUG SCREEN, HOSP PERFORMED
Amphetamines: NOT DETECTED
Barbiturates: NOT DETECTED
Benzodiazepines: NOT DETECTED
Cocaine: NOT DETECTED
Opiates: NOT DETECTED
Tetrahydrocannabinol: NOT DETECTED

## 2022-02-13 LAB — SODIUM
Sodium: 138 mmol/L (ref 135–145)
Sodium: 139 mmol/L (ref 135–145)

## 2022-02-13 LAB — HIV ANTIBODY (ROUTINE TESTING W REFLEX): HIV Screen 4th Generation wRfx: NONREACTIVE

## 2022-02-13 LAB — GLUCOSE, CAPILLARY
Glucose-Capillary: 123 mg/dL — ABNORMAL HIGH (ref 70–99)
Glucose-Capillary: 127 mg/dL — ABNORMAL HIGH (ref 70–99)

## 2022-02-13 LAB — MRSA NEXT GEN BY PCR, NASAL: MRSA by PCR Next Gen: NOT DETECTED

## 2022-02-13 LAB — BLOOD GAS, VENOUS
Acid-Base Excess: 0.5 mmol/L (ref 0.0–2.0)
Bicarbonate: 25.4 mmol/L (ref 20.0–28.0)
O2 Saturation: 87.2 %
Patient temperature: 37
pCO2, Ven: 41 mmHg — ABNORMAL LOW (ref 44–60)
pH, Ven: 7.4 (ref 7.25–7.43)
pO2, Ven: 56 mmHg — ABNORMAL HIGH (ref 32–45)

## 2022-02-13 LAB — MAGNESIUM: Magnesium: 2.4 mg/dL (ref 1.7–2.4)

## 2022-02-13 LAB — TROPONIN I (HIGH SENSITIVITY): Troponin I (High Sensitivity): 87 ng/L — ABNORMAL HIGH (ref <18)

## 2022-02-13 LAB — D-DIMER, QUANTITATIVE: D-Dimer, Quant: 0.82 ug/mL-FEU — ABNORMAL HIGH (ref 0.00–0.50)

## 2022-02-13 LAB — C-REACTIVE PROTEIN: CRP: 2.3 mg/dL — ABNORMAL HIGH (ref <1.0)

## 2022-02-13 LAB — ETHANOL: Alcohol, Ethyl (B): <10 mg/dL (ref <10)

## 2022-02-13 MED ORDER — SODIUM CHLORIDE 0.9 % IV SOLN: INTRAVENOUS | Status: DC | PRN

## 2022-02-13 MED ORDER — SODIUM CHLORIDE 3 % IV BOLUS
150.0000 mL | Freq: Once | INTRAVENOUS | Status: AC
  Administered 2022-02-13: 150 mL via INTRAVENOUS
  Filled 2022-02-13: qty 500

## 2022-02-13 MED ORDER — SODIUM CHLORIDE 0.9 % IV SOLN: INTRAVENOUS | Status: DC

## 2022-02-13 MED ORDER — HYDRALAZINE HCL 20 MG/ML IJ SOLN
20.0000 mg | Freq: Once | INTRAMUSCULAR | Status: AC
  Administered 2022-02-13: 20 mg via INTRAVENOUS
  Filled 2022-02-13: qty 1

## 2022-02-13 MED ORDER — LABETALOL HCL 5 MG/ML IV SOLN
10.0000 mg | INTRAVENOUS | Status: DC | PRN
  Administered 2022-02-13: 10 mg via INTRAVENOUS
  Filled 2022-02-13 (×2): qty 4

## 2022-02-13 MED ORDER — ONDANSETRON HCL 4 MG/2ML IJ SOLN
4.0000 mg | Freq: Once | INTRAMUSCULAR | Status: AC
  Filled 2022-02-13: qty 2

## 2022-02-13 MED ORDER — STROKE: EARLY STAGES OF RECOVERY BOOK
Freq: Once | Status: AC
  Administered 2022-02-13: 1
  Filled 2022-02-13: qty 1

## 2022-02-13 MED ORDER — IOHEXOL 350 MG/ML SOLN
65.0000 mL | Freq: Once | INTRAVENOUS | Status: AC | PRN
  Administered 2022-02-13: 65 mL via INTRAVENOUS

## 2022-02-13 MED ORDER — SENNOSIDES-DOCUSATE SODIUM 8.6-50 MG PO TABS
1.0000 | ORAL_TABLET | Freq: Two times a day (BID) | ORAL | Status: DC
  Administered 2022-02-14 – 2022-02-15 (×2): 1 via ORAL
  Filled 2022-02-13 (×3): qty 1

## 2022-02-13 MED ORDER — CLEVIDIPINE BUTYRATE 0.5 MG/ML IV EMUL
INTRAVENOUS | Status: AC
  Administered 2022-02-13: 2 mg/h via INTRAVENOUS
  Filled 2022-02-13: qty 50

## 2022-02-13 MED ORDER — CLEVIDIPINE BUTYRATE 0.5 MG/ML IV EMUL: 0.0000 mg/h | INTRAVENOUS | Status: DC

## 2022-02-13 MED ORDER — ACETAMINOPHEN 325 MG PO TABS: 650.0000 mg | ORAL_TABLET | ORAL | Status: DC | PRN

## 2022-02-13 MED ORDER — DEXAMETHASONE SODIUM PHOSPHATE 10 MG/ML IJ SOLN
6.0000 mg | INTRAMUSCULAR | Status: DC
  Administered 2022-02-13 – 2022-02-17 (×5): 6 mg via INTRAVENOUS
  Filled 2022-02-13 (×5): qty 1

## 2022-02-13 MED ORDER — GUAIFENESIN-DM 100-10 MG/5ML PO SYRP
10.0000 mL | ORAL_SOLUTION | ORAL | Status: DC | PRN
  Filled 2022-02-13: qty 10

## 2022-02-13 MED ORDER — ACETAMINOPHEN 650 MG RE SUPP
650.0000 mg | RECTAL | Status: DC | PRN
  Administered 2022-02-17 – 2022-02-25 (×5): 650 mg via RECTAL
  Filled 2022-02-13 (×5): qty 1

## 2022-02-13 MED ORDER — ACETAMINOPHEN 160 MG/5ML PO SOLN
650.0000 mg | ORAL | Status: DC | PRN
  Filled 2022-02-13: qty 20.3

## 2022-02-13 MED ORDER — LABETALOL HCL 5 MG/ML IV SOLN
INTRAVENOUS | Status: AC
  Administered 2022-02-13: 10 mg via INTRAVENOUS
  Filled 2022-02-13: qty 4

## 2022-02-13 MED ORDER — TENECTEPLASE FOR STROKE
PACK | INTRAVENOUS | Status: AC
  Filled 2022-02-13: qty 10

## 2022-02-13 MED ORDER — ASCORBIC ACID 500 MG PO TABS
500.0000 mg | ORAL_TABLET | Freq: Every day | ORAL | Status: DC
  Administered 2022-02-14 – 2022-02-15 (×2): 500 mg via ORAL
  Filled 2022-02-13 (×3): qty 1

## 2022-02-13 MED ORDER — NICOTINE 21 MG/24HR TD PT24
21.0000 mg | MEDICATED_PATCH | Freq: Every day | TRANSDERMAL | Status: DC
  Administered 2022-02-14 – 2022-02-20 (×6): 21 mg via TRANSDERMAL
  Filled 2022-02-13 (×7): qty 1

## 2022-02-13 MED ORDER — ONDANSETRON HCL 4 MG/2ML IJ SOLN
INTRAMUSCULAR | Status: AC
  Administered 2022-02-13: 4 mg via INTRAVENOUS
  Filled 2022-02-13: qty 2

## 2022-02-13 MED ORDER — LABETALOL HCL 5 MG/ML IV SOLN
10.0000 mg | INTRAVENOUS | Status: DC | PRN
  Administered 2022-02-13 – 2022-02-23 (×33): 10 mg via INTRAVENOUS
  Filled 2022-02-13 (×26): qty 4

## 2022-02-13 MED ORDER — ZINC SULFATE 220 (50 ZN) MG PO CAPS
220.0000 mg | ORAL_CAPSULE | Freq: Every day | ORAL | Status: DC
  Administered 2022-02-14 – 2022-02-15 (×2): 220 mg via ORAL
  Filled 2022-02-13 (×3): qty 1

## 2022-02-13 MED ORDER — CHLORHEXIDINE GLUCONATE CLOTH 2 % EX PADS
6.0000 | MEDICATED_PAD | Freq: Every day | CUTANEOUS | Status: DC
Start: 2022-02-14 — End: 2022-02-27
  Administered 2022-02-14 – 2022-02-25 (×11): 6 via TOPICAL

## 2022-02-13 MED ORDER — POTASSIUM CHLORIDE 10 MEQ/100ML IV SOLN
10.0000 meq | INTRAVENOUS | Status: AC
  Administered 2022-02-13 (×2): 10 meq via INTRAVENOUS
  Filled 2022-02-13 (×2): qty 100

## 2022-02-13 MED ORDER — SODIUM CHLORIDE 3 % IV SOLN
INTRAVENOUS | Status: DC
  Filled 2022-02-13 (×8): qty 500

## 2022-02-13 MED ORDER — INSULIN ASPART 100 UNIT/ML IJ SOLN
0.0000 [IU] | INTRAMUSCULAR | Status: DC
  Administered 2022-02-13 – 2022-02-14 (×4): 2 [IU] via SUBCUTANEOUS
  Administered 2022-02-14: 3 [IU] via SUBCUTANEOUS
  Administered 2022-02-14 – 2022-02-16 (×7): 2 [IU] via SUBCUTANEOUS
  Administered 2022-02-17 (×2): 3 [IU] via SUBCUTANEOUS
  Administered 2022-02-18 – 2022-02-20 (×10): 2 [IU] via SUBCUTANEOUS
  Administered 2022-02-20 – 2022-02-21 (×4): 3 [IU] via SUBCUTANEOUS
  Administered 2022-02-21: 2 [IU] via SUBCUTANEOUS
  Administered 2022-02-21 (×3): 3 [IU] via SUBCUTANEOUS
  Administered 2022-02-22: 2 [IU] via SUBCUTANEOUS
  Administered 2022-02-22 – 2022-02-23 (×6): 3 [IU] via SUBCUTANEOUS
  Administered 2022-02-23: 2 [IU] via SUBCUTANEOUS
  Administered 2022-02-23 – 2022-02-24 (×4): 3 [IU] via SUBCUTANEOUS
  Administered 2022-02-24 (×2): 2 [IU] via SUBCUTANEOUS
  Administered 2022-02-24 (×2): 3 [IU] via SUBCUTANEOUS
  Administered 2022-02-24 – 2022-02-25 (×2): 2 [IU] via SUBCUTANEOUS
  Administered 2022-02-25: 3 [IU] via SUBCUTANEOUS
  Administered 2022-02-25: 5 [IU] via SUBCUTANEOUS
  Administered 2022-02-25: 2 [IU] via SUBCUTANEOUS
  Administered 2022-02-25: 3 [IU] via SUBCUTANEOUS
  Administered 2022-02-26: 2 [IU] via SUBCUTANEOUS
  Administered 2022-02-26 (×2): 3 [IU] via SUBCUTANEOUS
  Administered 2022-02-26 (×2): 2 [IU] via SUBCUTANEOUS
  Administered 2022-02-27: 3 [IU] via SUBCUTANEOUS
  Administered 2022-02-27: 2 [IU] via SUBCUTANEOUS

## 2022-02-13 MED ORDER — CLEVIDIPINE BUTYRATE 0.5 MG/ML IV EMUL
0.0000 mg/h | INTRAVENOUS | Status: DC
  Administered 2022-02-13: 18 mg/h via INTRAVENOUS
  Administered 2022-02-13: 32 mg/h via INTRAVENOUS
  Administered 2022-02-13: 21 mg/h via INTRAVENOUS
  Administered 2022-02-13: 32 mg/h via INTRAVENOUS
  Administered 2022-02-13 (×2): 21 mg/h via INTRAVENOUS
  Administered 2022-02-14: 28 mg/h via INTRAVENOUS
  Administered 2022-02-14 (×2): 32 mg/h via INTRAVENOUS
  Administered 2022-02-14: 22 mg/h via INTRAVENOUS
  Administered 2022-02-14 (×2): 32 mg/h via INTRAVENOUS
  Administered 2022-02-14 (×2): 30 mg/h via INTRAVENOUS
  Administered 2022-02-14: 32 mg/h via INTRAVENOUS
  Administered 2022-02-14: 22 mg/h via INTRAVENOUS
  Administered 2022-02-14 (×2): 32 mg/h via INTRAVENOUS
  Administered 2022-02-14: 22 mg/h via INTRAVENOUS
  Administered 2022-02-15: 20 mg/h via INTRAVENOUS
  Administered 2022-02-15: 15 mg/h via INTRAVENOUS
  Administered 2022-02-15: 21 mg/h via INTRAVENOUS
  Administered 2022-02-15: 20 mg/h via INTRAVENOUS
  Administered 2022-02-16: 28 mg/h via INTRAVENOUS
  Administered 2022-02-16: 31 mg/h via INTRAVENOUS
  Administered 2022-02-16: 23 mg/h via INTRAVENOUS
  Administered 2022-02-16: 32 mg/h via INTRAVENOUS
  Administered 2022-02-16 (×2): 30 mg/h via INTRAVENOUS
  Administered 2022-02-16: 32 mg/h via INTRAVENOUS
  Administered 2022-02-16: 28 mg/h via INTRAVENOUS
  Administered 2022-02-17 (×14): 32 mg/h via INTRAVENOUS
  Administered 2022-02-17: 30 mg/h via INTRAVENOUS
  Administered 2022-02-18: 26 mg/h via INTRAVENOUS
  Administered 2022-02-18: 32 mg/h via INTRAVENOUS
  Administered 2022-02-18 (×3): 30 mg/h via INTRAVENOUS
  Administered 2022-02-18 (×4): 32 mg/h via INTRAVENOUS
  Administered 2022-02-18: 30 mg/h via INTRAVENOUS
  Administered 2022-02-18: 32 mg/h via INTRAVENOUS
  Administered 2022-02-18: 30 mg/h via INTRAVENOUS
  Administered 2022-02-18: 32 mg/h via INTRAVENOUS
  Administered 2022-02-18: 30 mg/h via INTRAVENOUS
  Administered 2022-02-19: 14 mg/h via INTRAVENOUS
  Administered 2022-02-19: 16 mg/h via INTRAVENOUS
  Filled 2022-02-13 (×3): qty 100
  Filled 2022-02-13: qty 200
  Filled 2022-02-13 (×4): qty 100
  Filled 2022-02-13 (×3): qty 200
  Filled 2022-02-13: qty 100
  Filled 2022-02-13: qty 200
  Filled 2022-02-13 (×2): qty 100
  Filled 2022-02-13 (×2): qty 50
  Filled 2022-02-13: qty 100
  Filled 2022-02-13: qty 200
  Filled 2022-02-13 (×11): qty 100
  Filled 2022-02-13: qty 200
  Filled 2022-02-13: qty 100
  Filled 2022-02-13 (×2): qty 200
  Filled 2022-02-13: qty 100
  Filled 2022-02-13 (×2): qty 200
  Filled 2022-02-13: qty 50
  Filled 2022-02-13: qty 100
  Filled 2022-02-13 (×2): qty 200
  Filled 2022-02-13 (×3): qty 100
  Filled 2022-02-13: qty 200
  Filled 2022-02-13: qty 100
  Filled 2022-02-13 (×2): qty 200
  Filled 2022-02-13: qty 100
  Filled 2022-02-13 (×3): qty 200
  Filled 2022-02-13: qty 100

## 2022-02-13 MED ORDER — PANTOPRAZOLE SODIUM 40 MG IV SOLR
40.0000 mg | Freq: Every day | INTRAVENOUS | Status: DC
  Administered 2022-02-13 – 2022-02-14 (×2): 40 mg via INTRAVENOUS
  Filled 2022-02-13 (×2): qty 10

## 2022-02-13 MED ORDER — PNEUMOCOCCAL 20-VAL CONJ VACC 0.5 ML IM SUSY
0.5000 mL | PREFILLED_SYRINGE | INTRAMUSCULAR | Status: AC
  Administered 2022-02-14: 0.5 mL via INTRAMUSCULAR
  Filled 2022-02-13: qty 0.5

## 2022-02-13 NOTE — ED Notes (Signed)
[facility] at bedside 
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
## 2022-02-13 NOTE — ED Notes (Signed)
Attempted to call report to [facility]. Nurse states to call house supervisor for possible ICU placement. AC called and advised to call patient placement for bed approval.  ?

## 2022-02-13 NOTE — Progress Notes (Signed)
Code Stroke documentation ? ?1225 Call time ?Sharon time ?1240 Exam started ?1252 Exam finished ?1256 Images sent to [facility] ?1257 Exam completed in [place] ?[employer] called ?
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
## 2022-02-13 NOTE — Progress Notes (Signed)
An USGPIV (ultrasound guided PIV) has been placed for short-term vasopressor infusion. A correctly placed ivWatch must be used when administering Vasopressors. Should this treatment be needed beyond 72 hours, central line access should be obtained.  It will be the responsibility of the bedside nurse to follow best practice to prevent extravasations.   ?

## 2022-02-13 NOTE — ED Notes (Signed)
Tele-neuro activated at this time for code stroke called in field.  ?

## 2022-02-13 NOTE — ED Notes (Signed)
[facility] leaving with patient at this time.  

## 2022-02-13 NOTE — ED Notes (Signed)
Patients O2 88% on RA. Pt placed on 2 L [place]. ?
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
## 2022-02-13 NOTE — Consult Note (Signed)
Code stroke pre elert at 1228. ? ?Left for CT at 1239. Returned at [employer]. ? ?Dr Selina Cooley called [facility] at 1259 with results of NCCT-disconnected at this time.  ? ?mRS 0. LKW 1145.  ?

## 2022-02-13 NOTE — Progress Notes (Signed)
Followed up with IV team regarding the need for U/S guided IV. Pt is on list and they are aware, but they may not arrive until the beginning of next shift. Dr. Iver Nestle notified and ok with waiting for the U/S guided IV unless the patient has a neurological decline. If pt condition changes, then MD gave verbal to use previous IV in RAC that has good blood return. Will report to night shift RN and monitor patient closely for any neuro changes. ?

## 2022-02-13 NOTE — Discharge Instructions (Signed)
Patient is being transferred to the emergency department at [facility].  Dr. Nicholes Rough excepting ?

## 2022-02-13 NOTE — Progress Notes (Signed)
Dr. Derry Lory updated. Patients 2300 BP 164/89. PRN labetalol given less than an hour ago. Cleviprex drip on max rate. Orders to give IV hydralazine 20 mg.  ? ?Dr. Derry Lory made aware patient passed [facility] bedside swallow evaluation.  ?

## 2022-02-13 NOTE — ED Notes (Addendum)
Attempted to call report x1 to [facility], nurse not available at this time. Name and phone number left with secretary.  ?

## 2022-02-13 NOTE — ED Notes (Signed)
Clinton Taylor with patient placement stated that she is working on ICU bed placement.  ?

## 2022-02-13 NOTE — H&P (Addendum)
Neurology H&P ?Reason for Consult: ICH ? ?CC: Right sided weakness ? ?History is obtained from:  Chart review, patient, and medical team ? ?HPI: Clinton Taylor is a 36 y.o. male with a PMHx of HTN, HLD, obesity (BMI 44.8), mood disorder, former smoking (1 ppd), obstructive sleep apnea on CPAP. ? ?He was in his usual state of health today, but while driving became acutely plegic on the right side.  He was able to park in his driveway at home and EMS was activated.  He was noted to have weakness and sensation loss on the right side, with intact language, possible subtle visual field deficit.  He did have an episode of emesis in the EMS truck.  He was initially taken to [facility], where head CT revealed an ICH, for which he was transferred to [facility] for further management.  Additionally there was concern for aspiration of his prior emesis given developing hypoxia just before coming to [facility] ? ?He is fully oriented, denies any prior symptoms, confirms that he did have a sinusitis for which she was treated with Augmentin and completed his course with good recovery.  He also noted he has been using a weight loss program ([employer]) with a 17 pound weight loss in the past 2 weeks. ? ?Additionally wife reports everyone at his work had Covid recently, he felt bad last Monday but was negative on home antigen test. PCR positive here.  ? ?LKW: 11:45 AM ?tPA given?: No, ICH ?IA performed?: No, ICH ?Premorbid modified rankin scale: 0-1 ?    0 - No symptoms. ?    1 - No significant disability. Able to carry out all usual activities, despite some symptoms. ? ?ICH Score:  ? Time performed: On arrival of patient to ICU ?GCS: 13-15 is 0 points ?Infratentorial: No.. If yes, 1 point - no ?Volume: >30cc is 1 point  ?Age: 36 y.o.. >80 is 1 point -- 0 ?Intraventricular extension is 1 point -- 0 ? ?Score: 1 ? ?A Score of 1 points has a 30 day mortality of 13%. Stroke. 2001 Apr;32(4):891-7. ? ? ? ?ROS: Unable to obtain due to  altered mental status.  ? ?Past Medical History:  ?Diagnosis Date  ? Anger   ? Anxiety   ? Depression   ? Hiatal hernia   ? Hypertension   ? Mood changes   ? Obsessive compulsive disorder   ? ?No past surgical history on file. ? ?Current Outpatient Medications  ?Medication Instructions  ? amLODipine (NORVASC) 10 MG tablet TAKE 1 TABLET(10 MG) BY MOUTH DAILY  ? citalopram (CELEXA) 40 MG tablet TAKE 1/2 TABLET(20 MG) BY MOUTH DAILY  ? cyclobenzaprine (FLEXERIL) 10 MG tablet TAKE 1 TABLET(10 MG) BY MOUTH THREE TIMES DAILY AS NEEDED FOR MUSCLE SPASMS  ? lamoTRIgine (LAMICTAL) 100 mg, Oral, Daily  ? losartan-hydrochlorothiazide (HYZAAR) 100-12.5 MG tablet 1 tablet, Oral, Daily  ? neomycin-polymyxin b-dexamethasone (MAXITROL) 3.5-10000-0.1 SUSP 2 drops, Both Eyes, Every 6 hours  ? nicotine (NICODERM CQ) 14 mg, Transdermal, Daily  ? oxyCODONE-acetaminophen (PERCOCET) 5-325 MG tablet 2 tablets, Oral, Every 6 hours PRN  ? traZODone (DESYREL) 50 mg, Oral, At bedtime PRN  ? ?No Known Allergies ? ?Family History  ?Problem Relation Age of Onset  ? OCD Mother   ? Alcohol abuse Father   ? Bipolar disorder Father   ? Drug abuse Father   ? Alcohol abuse Maternal Uncle   ? Alcohol abuse Paternal Uncle   ? OCD Maternal Grandfather   ?  Alcohol abuse Paternal Grandmother   ? Drug abuse Paternal Grandmother   ? ? ?Social History:  reports that he has quit smoking. His smoking use included cigarettes. He smoked an average of 1 pack per day. He has quit using smokeless tobacco.  His smokeless tobacco use included chew. He reports that he does not drink alcohol and does not use drugs. ? ?Exam: ?Current vital signs: ?BP (!) 152/102   Pulse 92   Temp 98.2 ?F (36.8 ?C) (Oral)   Resp (!) 24   Ht 6\' 6"  (1.981 m)   Wt (!) 176 kg   SpO2 92%   BMI 44.84 kg/m?  ?Vital signs in last 24 hours: ?Temp:  [98.2 ?F (36.8 ?C)] 98.2 ?F (36.8 ?C) (04/29 1301) ?Pulse Rate:  [77-92] 92 (04/29 1353) ?Resp:  [15-27] 24 (04/29 1353) ?BP: (138-201)/(83-138)  152/102 (04/29 1350) ?SpO2:  [89 %-95 %] 92 % (04/29 1353) ?Weight:  [176 kg] 176 kg (04/29 1258) ? ? ?Physical Exam  ?Constitutional: Obese, but comfortable at rest in no acute distress ?Psych: Affect calm and cooperative ?Eyes: No scleral injection ?HENT: No oropharyngeal obstruction.  ?MSK: no joint deformities.  ?Cardiovascular: Normal rate and regular rhythm, heart rate 90s, perfusing extremities well ?Respiratory: Effort normal, non-labored breathing ?GI: Soft.  No distension. There is no tenderness.  ?Skin: Warm dry and intact visible skin ? ?Neuro: ?Mental Status: ?Patient is awake, alert, oriented to person, place, month, year, and situation. ?Patient is able to give a clear and coherent history. ?No signs of aphasia or neglect ?Cranial Nerves: ?II: Visual Fields are notable for loss of vision in the right lower quadrant. Pupils are equal, round, and reactive to light.   ?III,IV, VI: EOMI without ptosis or diploplia.  ?V: Facial sensation is symmetric to temperature ?VII: Facial movement is notable for a right facial droop ?VIII: hearing is intact to voice ?X: Uvula elevates symmetrically ?XI: Shoulder shrug is symmetric. ?XII: tongue is midline without atrophy or fasciculations.  ?Motor: ?Tone is normal. Bulk is normal.  No pronator drift in the left upper extremity, able to maintain the left lower extremity 5 seconds antigravity, extensor posturing in the right upper extremity and triple flexion in the right lower extremity ?Sensory: ?Severely diminished sensation on the right arm and leg (insensate to noxious stimulation) ?Plantar reflexes: ?Toes are upgoing on the right and downgoing on the left ?Cerebellar: ?FNF and HKS are intact on the left ?Gait:  ?Deferred in acute setting, too weak ? ?NIHSS total 12 ?Performed at time of patient arrival to ED  ? ? ?I have reviewed labs in epic and the results pertinent to this consultation are: ? ?Basic Metabolic Panel: ?Recent Labs  ?Lab 02/12/2022 ?1242  ?NA 138   ?K 2.9*  ?CL 103  ?CO2 23  ?GLUCOSE 125*  ?BUN 29*  ?CREATININE 2.59*  ?CALCIUM 8.9  ? ? ?CBC: ?Recent Labs  ?Lab 01/18/2022 ?1242  ?WBC 15.2*  ?NEUTROABS 9.4*  ?HGB 13.9  ?HCT 43.6  ?MCV 79.0*  ?PLT 275  ? ? ?Coagulation Studies: ?Recent Labs  ?  01/28/2022 ?1242  ?LABPROT 13.3  ?INR 1.0  ?  ?Lab Results  ?Component Value Date  ? HGBA1C 5.4 12/15/2020  ? ?Lab Results  ?Component Value Date  ? CHOL 164 12/15/2020  ? HDL 26 (L) 12/15/2020  ? LDLCALC 103 (H) 12/15/2020  ? TRIG 198 (H) 12/15/2020  ? CHOLHDL 6.3 (H) 12/15/2020  ? ? ? ?I have reviewed the images obtained: ? ?HeadCT personally  reviewed, agree with radiology: ?Brain: Parenchymal hemorrhage involving the left corona radiata, subinsular white matter, and lentiform nucleus. This measures about 6.1 x 4.1 x 4.6 cm. Mild surrounding edema. There is no evidence of ?intraventricular extension. Regional mass effect is present. There is mild rightward midline shift measuring 4 mm. Suspect early trapping of the right lateral ventricle.  ? ?CTA personally reviewed, agree with radiology that this is a nondiagnostic evaluation ? ? ?Impression: 36 year old man with likely hypertensive hemorrhage in the setting of multiple small vessel risk factors as detailed above ? ?Recommendations: ?Assessment:  ? ?Plan: ?Subcortical ICH, nontraumatic ? ? ?Acuity: Acute ?Laterality: Left basal ganglia ?Current suspected etiology: Hypertension ?Treatment: ?-Admit to ICU ?-ICH Score: 1 ?-ICH Volume: Greater than 30 cc ?-BP control goal SYS BP 130-150 ?-PT/OT/ST  ?-neuromonitoring ?-Repeat head CT at 7:30 PM to monitor for worsening ?-MRI brain with without contrast to evaluate for possible underlying mass when clinically stabilized (not yet ordered) ? ?CNS ?Cerebral edema ?-150 cc 3%, followed by 50 cc an hour for goal sodium 145-150, when additional IV access can be obtained by IV team (per nurse should be early evening shift on 4/29) ? -If neurological status declines, will consult CCM  for central line ?-Close neuro monitoring ? ?Potential trapping of the lateral ventricle ?-monitor ? ?Dysarthria ?Dysphagia following ICH  ?-NPO until cleared by speech ?-ST ?-Advance diet as tolerated ?-May need

## 2022-02-13 NOTE — ED Notes (Signed)
CODE STROKE called and beeped out @ 1225. ?

## 2022-02-13 NOTE — Progress Notes (Signed)
?  ? ? ?          Medical Consultation ? ? ?Clinton GaussBryan Mealing  ZOX:[id number]  DOB: July 23, 1986  DOA: 02/11/2022 ? ?PCP: Georgina QuintSagardia, Miguel Jose, MD  ? ?Requesting physician: Dr. Iver NestleBhagat, MD ? ?Reason for consultation: + Covid  ?  ? ?History of Present Illness: ? ?Clinton Taylor is a 36 year old male with past medical history significant for essential hypertension, OSA on CPAP, mood disorder, tobacco use disorder, morbid obesity who [facility] after developing right-sided flaccid paralysis when driving home today.  Patient reports he was driving home from the gas station and when he arrived in his driveway approximately 11:45 AM, patient was unable to get out of the vehicle.  He reports right-sided upper and lower extremity weakness with loss of sensation as well as right-sided facial droop.  Patient has a history of poorly controlled blood pressure, not on anticoagulation outpatient. ? ?Patient reports many of his coworkers became sick last week and were diagnosed with COVID.  Patient reports nonproductive cough since.  Not oxygen dependent at baseline but utilizes CPAP nightly and is an active cigarette smoker smoking 1 pack/day.  Is on vaccinated against COVID. ? ?In the ED, code stroke was initiated and evaluated by neurology.  Temperature 98.2 ?F, HR 78, RR 27, BP 201/138, SPO2 90% on room air.  Sodium 138, potassium 2.9, chloride 103, CO2 23, glucose 125, BUN 29, creatinine 2.59.  AST 26, ALT 26, total bilirubin 0.2.  WBC 15.2, hemoglobin 13.9, platelets 275.  COVID-19 PCR positive.  Influenza A/B PCR negative.  EtOH level less than 10.  Chest x-ray with cardiomegaly, mild peribronchial thickening consistent with pulmonary edema versus bronchitis, possible small left pleural effusion.  CT head code stroke with acute left cerebral parenchymal hemorrhage, no intraventricular extension, mild rightward midline shift with probable early trapping of the right lateral ventricle.  Patient was started on a  Cleviprex drip and admitted to the neuro ICU service.  Hospitalist service consulted for evaluation of positive COVID test. ? ? ? ?Review of Systems:  ?Constitutional - + Fatigue, No Weight Loss ?Vision - No impaired vision, decreased visual acuity ?Ear/Nose/Mouth/Throat - No decreased hearing, no congestion ?Respiratory - No shortness of breath, no exertional dyspnea, +  cough ?Cardiovascular - No chest pain, no palpitations, no peripheral edema ?Gastrointestinal - + nausea, no diarrhea, no constipation, ?Genitourinary - No excessive urination, no urinary incontinence ?Integumentary - No rashes or concerning skin lesions ?Neurologic - + numbness, no dizziness, no headaches, no confusion or memory loss, + right upper/lower extremity weakness/flaccid paralysis ? ? ? ?Past Medical History: ?Past Medical History:  ?Diagnosis Date  ? Anger   ? Anxiety   ? Depression   ? Hiatal hernia   ? Hypertension   ? Mood changes   ? Obsessive compulsive disorder   ? ? ?Past Surgical History: ?No past surgical history on file. ? ? ?Allergies: ? No Known Allergies ? ? ?Social History: ? reports that he has quit smoking. His smoking use included cigarettes. He smoked an average of 1 pack per day. He has quit using smokeless tobacco.  His smokeless tobacco use included chew. He reports that he does not drink alcohol and does not use drugs. ? ? ?Family History: ?Family History  ?Problem Relation Age of Onset  ? OCD Mother   ? Alcohol abuse Father   ? Bipolar disorder Father   ? Drug abuse Father   ? Alcohol abuse Maternal Uncle   ?  Alcohol abuse Paternal Uncle   ? OCD Maternal Grandfather   ? Alcohol abuse Paternal Grandmother   ? Drug abuse Paternal Grandmother   ? ? ?Family history reviewed and not pertinent  ? ? ?Physical Exam: ?Vitals:  ? 01/28/2022 1600 01/20/2022 1645 01/17/2022 1700 02/11/2022 1715  ?BP: (!) 145/76 (!) 156/89 (!) 163/91 (!) 162/80  ?Pulse: 84 84 89 88  ?Resp: 20 (!) 22 (!) 29 19  ?Temp: 98.3 ?F (36.8 ?C)     ?TempSrc:  Oral     ?SpO2: 91% 90% 90% 90%  ?Weight:      ?Height:      ? ? ?Physical Exam: ?GEN: NAD, alert and oriented x 3, morbidly obese ?HEENT: NCAT, PERRL, EOMI, sclera clear, MMM, noted right-sided facial droop ?PULM: Decreased breath sounds bilateral bases with mild wheezing, normal respiratory effort, on 4 L nasal cannula with SPO2 92% at rest ?CV: RRR w/o M/G/R ?GI: abd soft, NTND, NABS, no R/G/M ?MSK: no peripheral edema, muscle strength 0/5 right upper/lower extremity, muscle strength globally intact left upper/lower extremity ?PSYCH: normal mood/affect ?Integumentary: dry/intact, no rashes or wounds ? ?Neuro Exam ?Mental Status: A&O x4, + dysarthria, no aphasia concentration and attention were appropriate ?Cranial Nerves: visual fields full, PERRL, EOMi, intact smooth pursuit, no nystagmus, facial sensation intact absent right side, 5/5 jaw strength, nasolabial fold & smile asymmetric on the right,   eye closure symetric, hearing symmetric and normal to rubbing fingers, palate elevates symmetrically, tongue protrusion is midline ?Motor: LUE 5/5,   LLE 5/5,   RUE 0/5,   RLE 0/5   ?normal muscle bulk no significant atrophy, normal tone, no spasticity or rigidity apperciated  ?Sensory: Sensation is grossly absent/decreased right face, right upper extremity, right chest/flank, right lower extremity  ?Coordination/Movement: no tremor noted ? ? ?Data reviewed:  I have personally reviewed following labs and imaging studies ?Labs:  ?CBC: ?Recent Labs  ?Lab 01/22/2022 ?1242  ?WBC 15.2*  ?NEUTROABS 9.4*  ?HGB 13.9  ?HCT 43.6  ?MCV 79.0*  ?PLT 275  ? ? ?Basic Metabolic Panel: ?Recent Labs  ?Lab 01/18/2022 ?1242  ?NA 138  ?K 2.9*  ?CL 103  ?CO2 23  ?GLUCOSE 125*  ?BUN 29*  ?CREATININE 2.59*  ?CALCIUM 8.9  ? ?GFR ?Estimated Creatinine Clearance: 69.2 mL/min (A) (by C-G formula based on SCr of 2.59 mg/dL (H)). ?Liver Function Tests: ?Recent Labs  ?Lab 02/03/2022 ?1242  ?AST 26  ?ALT 26  ?ALKPHOS 88  ?BILITOT 0.8  ?PROT 8.1  ?ALBUMIN  4.2  ? ?No results for input(s): LIPASE, AMYLASE in the last 168 hours. ?No results for input(s): AMMONIA in the last 168 hours. ?Coagulation profile ?Recent Labs  ?Lab 01/24/2022 ?1242  ?INR 1.0  ? ? ?Cardiac Enzymes: ?No results for input(s): CKTOTAL, CKMB, CKMBINDEX, TROPONINI in the last 168 hours. ?BNP: ?Invalid input(s): POCBNP ?CBG: ?Recent Labs  ?Lab 02/09/2022 ?1655  ?GLUCAP 123*  ? ?D-Dimer ?No results for input(s): DDIMER in the last 72 hours. ?Hgb A1c ?No results for input(s): HGBA1C in the last 72 hours. ?Lipid Profile ?No results for input(s): CHOL, HDL, LDLCALC, TRIG, CHOLHDL, LDLDIRECT in the last 72 hours. ?Thyroid function studies ?No results for input(s): TSH, T4TOTAL, T3FREE, THYROIDAB in the last 72 hours. ? ?Invalid input(s): FREET3 ?Anemia work up ?No results for input(s): VITAMINB12, FOLATE, FERRITIN, TIBC, IRON, RETICCTPCT in the last 72 hours. ?Urinalysis ?No results found for: COLORURINE, APPEARANCEUR, LABSPEC, PHURINE, GLUCOSEU, HGBUR, BILIRUBINUR, KETONESUR, PROTEINUR, UROBILINOGEN, NITRITE, LEUKOCYTESUR ? ? ?Microbiology ?Recent Results (from  the past 240 hour(s))  ?Resp Panel by RT-PCR (Flu A&B, Covid) Nasopharyngeal Swab     Status: Abnormal  ? Collection Time: 01/20/2022 12:42 PM  ? Specimen: Nasopharyngeal Swab; Nasopharyngeal(NP) swabs in vial transport medium  ?Result Value Ref Range Status  ? SARS Coronavirus 2 by RT PCR POSITIVE (A) NEGATIVE Final  ?  Comment: (NOTE) ?SARS-CoV-2 target nucleic acids are DETECTED. ? ?The SARS-CoV-2 RNA is generally detectable in upper respiratory ?specimens during the acute phase of infection. Positive results are ?indicative of the presence of the identified virus, but do not rule ?out bacterial infection or co-infection with other pathogens not ?detected by the test. Clinical correlation with patient history and ?other diagnostic information is necessary to determine patient ?infection status. The expected result is Negative. ? ?Fact Sheet for  Patients: ?BloggerCourse.com ? ?Fact Sheet for Healthcare Providers: ?SeriousBroker.it ? ?This test is not yet approved or cleared by the [place] FDA a

## 2022-02-13 NOTE — ED Notes (Signed)
Called charge RN at [employer] and [employer] operational officer states that she is unavailable for report. Informed her that [facility] was at our facility preparing for transport and would be leaving without it. Secretary states that she has already informed them of such.  ?

## 2022-02-13 NOTE — ED Triage Notes (Addendum)
Patient arrived via EMS with complaints of code stroke. Patient vomiting. EMS gave 4mg  zofran. ?

## 2022-02-13 NOTE — ED Notes (Signed)
Pt into CT, no changes, alert, NAD, calm, interactive, airway patent, denies nausea.  ?

## 2022-02-14 ENCOUNTER — Inpatient Hospital Stay (HOSPITAL_COMMUNITY): Payer: BC Managed Care – PPO

## 2022-02-14 DIAGNOSIS — I610 Nontraumatic intracerebral hemorrhage in hemisphere, subcortical: Secondary | ICD-10-CM | POA: Diagnosis not present

## 2022-02-14 DIAGNOSIS — U071 COVID-19: Secondary | ICD-10-CM | POA: Diagnosis not present

## 2022-02-14 DIAGNOSIS — I10 Essential (primary) hypertension: Secondary | ICD-10-CM | POA: Diagnosis not present

## 2022-02-14 DIAGNOSIS — I618 Other nontraumatic intracerebral hemorrhage: Secondary | ICD-10-CM | POA: Diagnosis not present

## 2022-02-14 DIAGNOSIS — N179 Acute kidney failure, unspecified: Secondary | ICD-10-CM | POA: Diagnosis not present

## 2022-02-14 DIAGNOSIS — F39 Unspecified mood [affective] disorder: Secondary | ICD-10-CM | POA: Diagnosis not present

## 2022-02-14 LAB — COMPREHENSIVE METABOLIC PANEL
ALT: 23 U/L (ref 0–44)
AST: 20 U/L (ref 15–41)
Albumin: 3.3 g/dL — ABNORMAL LOW (ref 3.5–5.0)
Alkaline Phosphatase: 69 U/L (ref 38–126)
Anion gap: 7 (ref 5–15)
BUN: 26 mg/dL — ABNORMAL HIGH (ref 6–20)
CO2: 23 mmol/L (ref 22–32)
Calcium: 8.4 mg/dL — ABNORMAL LOW (ref 8.9–10.3)
Chloride: 108 mmol/L (ref 98–111)
Creatinine, Ser: 2.9 mg/dL — ABNORMAL HIGH (ref 0.61–1.24)
GFR, Estimated: 28 mL/min — ABNORMAL LOW (ref >60)
Glucose, Bld: 134 mg/dL — ABNORMAL HIGH (ref 70–99)
Potassium: 3.1 mmol/L — ABNORMAL LOW (ref 3.5–5.1)
Sodium: 138 mmol/L (ref 135–145)
Total Bilirubin: 0.4 mg/dL (ref 0.3–1.2)
Total Protein: 6.8 g/dL (ref 6.5–8.1)

## 2022-02-14 LAB — CBC WITH DIFFERENTIAL/PLATELET
Abs Immature Granulocytes: 0.15 10*3/uL — ABNORMAL HIGH (ref 0.00–0.07)
Basophils Absolute: 0 10*3/uL (ref 0.0–0.1)
Basophils Relative: 0 %
Eosinophils Absolute: 0 10*3/uL (ref 0.0–0.5)
Eosinophils Relative: 0 %
HCT: 38.5 % — ABNORMAL LOW (ref 39.0–52.0)
Hemoglobin: 13 g/dL (ref 13.0–17.0)
Immature Granulocytes: 1 %
Lymphocytes Relative: 8 %
Lymphs Abs: 1 10*3/uL (ref 0.7–4.0)
MCH: 25.9 pg — ABNORMAL LOW (ref 26.0–34.0)
MCHC: 33.8 g/dL (ref 30.0–36.0)
MCV: 76.7 fL — ABNORMAL LOW (ref 80.0–100.0)
Monocytes Absolute: 0.4 10*3/uL (ref 0.1–1.0)
Monocytes Relative: 3 %
Neutro Abs: 10.9 10*3/uL — ABNORMAL HIGH (ref 1.7–7.7)
Neutrophils Relative %: 88 %
Platelets: 249 10*3/uL (ref 150–400)
RBC: 5.02 MIL/uL (ref 4.22–5.81)
RDW: 16.5 % — ABNORMAL HIGH (ref 11.5–15.5)
WBC: 12.4 10*3/uL — ABNORMAL HIGH (ref 4.0–10.5)
nRBC: 0 % (ref 0.0–0.2)

## 2022-02-14 LAB — GLUCOSE, CAPILLARY
Glucose-Capillary: 120 mg/dL — ABNORMAL HIGH (ref 70–99)
Glucose-Capillary: 123 mg/dL — ABNORMAL HIGH (ref 70–99)
Glucose-Capillary: 132 mg/dL — ABNORMAL HIGH (ref 70–99)
Glucose-Capillary: 134 mg/dL — ABNORMAL HIGH (ref 70–99)
Glucose-Capillary: 140 mg/dL — ABNORMAL HIGH (ref 70–99)
Glucose-Capillary: 141 mg/dL — ABNORMAL HIGH (ref 70–99)
Glucose-Capillary: 153 mg/dL — ABNORMAL HIGH (ref 70–99)

## 2022-02-14 LAB — ECHOCARDIOGRAM COMPLETE
AV Mean grad: 4 mmHg
AV Peak grad: 7.1 mmHg
Ao pk vel: 1.33 m/s
Area-P 1/2: 4.31 cm2
Height: 78 in
S' Lateral: 4.2 cm
Weight: 6208 oz

## 2022-02-14 LAB — LIPID PANEL
Cholesterol: 192 mg/dL (ref 0–200)
HDL: 20 mg/dL — ABNORMAL LOW (ref >40)
LDL Cholesterol: 110 mg/dL — ABNORMAL HIGH (ref 0–99)
Total CHOL/HDL Ratio: 9.6 RATIO
Triglycerides: 309 mg/dL — ABNORMAL HIGH (ref <150)
VLDL: 62 mg/dL — ABNORMAL HIGH (ref 0–40)

## 2022-02-14 LAB — SODIUM
Sodium: 140 mmol/L (ref 135–145)
Sodium: 145 mmol/L (ref 135–145)

## 2022-02-14 LAB — CREATININE, URINE, RANDOM: Creatinine, Urine: 86.26 mg/dL

## 2022-02-14 LAB — FERRITIN: Ferritin: 70 ng/mL (ref 24–336)

## 2022-02-14 LAB — D-DIMER, QUANTITATIVE: D-Dimer, Quant: 0.67 ug/mL-FEU — ABNORMAL HIGH (ref 0.00–0.50)

## 2022-02-14 LAB — C-REACTIVE PROTEIN: CRP: 2.6 mg/dL — ABNORMAL HIGH (ref <1.0)

## 2022-02-14 MED ORDER — LABETALOL HCL 5 MG/ML IV SOLN
0.5000 mg/min | Status: DC
  Administered 2022-02-14: 0.5 mg/min via INTRAVENOUS
  Filled 2022-02-14 (×2): qty 80

## 2022-02-14 MED ORDER — HYDRALAZINE HCL 20 MG/ML IJ SOLN
20.0000 mg | Freq: Once | INTRAMUSCULAR | Status: AC
  Administered 2022-02-14: 20 mg via INTRAVENOUS
  Filled 2022-02-14: qty 1

## 2022-02-14 MED ORDER — IPRATROPIUM-ALBUTEROL 0.5-2.5 (3) MG/3ML IN SOLN: 3.0000 mL | Freq: Four times a day (QID) | RESPIRATORY_TRACT | Status: DC

## 2022-02-14 MED ORDER — IPRATROPIUM-ALBUTEROL 0.5-2.5 (3) MG/3ML IN SOLN
3.0000 mL | Freq: Four times a day (QID) | RESPIRATORY_TRACT | Status: DC
  Administered 2022-02-14 – 2022-02-17 (×11): 3 mL via RESPIRATORY_TRACT
  Filled 2022-02-14 (×11): qty 3

## 2022-02-14 MED ORDER — LABETALOL HCL 5 MG/ML IV SOLN
0.5000 mg/min | Status: DC
  Administered 2022-02-14 (×2): 3 mg/min via INTRAVENOUS
  Administered 2022-02-15: 2 mg/min via INTRAVENOUS
  Filled 2022-02-14 (×5): qty 200

## 2022-02-14 MED ORDER — PERFLUTREN LIPID MICROSPHERE
1.0000 mL | INTRAVENOUS | Status: AC | PRN
  Administered 2022-02-14: 2 mL via INTRAVENOUS
  Filled 2022-02-14: qty 10

## 2022-02-14 MED ORDER — IPRATROPIUM-ALBUTEROL 20-100 MCG/ACT IN AERS
1.0000 | INHALATION_SPRAY | Freq: Four times a day (QID) | RESPIRATORY_TRACT | Status: DC
  Administered 2022-02-14: 1 via RESPIRATORY_TRACT
  Filled 2022-02-14: qty 4

## 2022-02-14 MED ORDER — CITALOPRAM HYDROBROMIDE 10 MG PO TABS
40.0000 mg | ORAL_TABLET | Freq: Every day | ORAL | Status: DC
  Administered 2022-02-14 – 2022-02-17 (×3): 40 mg via ORAL
  Filled 2022-02-14 (×4): qty 4

## 2022-02-14 MED ORDER — AMLODIPINE BESYLATE 10 MG PO TABS
10.0000 mg | ORAL_TABLET | Freq: Every day | ORAL | Status: DC
Start: 2022-02-14 — End: 2022-02-18
  Administered 2022-02-14 – 2022-02-17 (×3): 10 mg via ORAL
  Filled 2022-02-14 (×4): qty 1

## 2022-02-14 MED ORDER — LIOTHYRONINE SODIUM 5 MCG PO TABS
5.0000 ug | ORAL_TABLET | Freq: Every day | ORAL | Status: DC
  Administered 2022-02-17: 5 ug via ORAL
  Filled 2022-02-14 (×5): qty 1

## 2022-02-14 MED ORDER — POTASSIUM CHLORIDE CRYS ER 20 MEQ PO TBCR
40.0000 meq | EXTENDED_RELEASE_TABLET | ORAL | Status: AC
  Administered 2022-02-14 (×2): 40 meq via ORAL
  Filled 2022-02-14 (×2): qty 2

## 2022-02-14 MED ORDER — ARIPIPRAZOLE 10 MG PO TABS
10.0000 mg | ORAL_TABLET | Freq: Every day | ORAL | Status: DC
  Administered 2022-02-14 – 2022-02-17 (×3): 10 mg via ORAL
  Filled 2022-02-14 (×6): qty 1

## 2022-02-14 MED ORDER — FUROSEMIDE 10 MG/ML IJ SOLN
40.0000 mg | Freq: Once | INTRAMUSCULAR | Status: AC
  Administered 2022-02-14: 40 mg via INTRAVENOUS
  Filled 2022-02-14: qty 4

## 2022-02-14 NOTE — Progress Notes (Addendum)
STROKE TEAM PROGRESS NOTE  ? ?INTERVAL HISTORY ?Patient is seen in his room with his wife at the bedside.  Yesterday, he experienced acute right hemiplegia with right sided numbness.  He was taken to [facility], was found to have an ICH and was transferred here for further management.  ICH score is 1.  Head CT last night reveals increasing left to right midline shift to 61mm without hydrocephalus.  3% HTS is running at 75cc/hr.  Patient has confirmed covid-19 infection.  His wife reports that he smokes cigarettes and is not always compliant with his medical therapy at home. ? ?Vitals:  ? 02/14/22 1115 02/14/22 1130 02/14/22 1145 02/14/22 1200  ?BP: (!) 165/87 (!) 168/95 (!) 196/103 (!) 173/96  ?Pulse: 94 97 91 99  ?Resp: (!) 25 (!) 22    ?Temp:      ?TempSrc:      ?SpO2: (!) 86% (!) 86% 93% (!) 86%  ?Weight:      ?Height:      ? ?CBC:  ?Recent Labs  ?Lab Mar 11, 2022 ?1242 02/14/22 ?0352  ?WBC 15.2* 12.4*  ?NEUTROABS 9.4* 10.9*  ?HGB 13.9 13.0  ?HCT 43.6 38.5*  ?MCV 79.0* 76.7*  ?PLT 275 249  ? ?Basic Metabolic Panel:  ?Recent Labs  ?Lab 03-11-2022 ?1751 Mar 11, 2022 ?2042 11-Mar-2022 ?2226 02/14/22 ?8119 02/14/22 ?1478  ?NA 138 137   < > 138 140  ?K  --  3.4*  --  3.1*  --   ?CL  --  102  --  108  --   ?CO2  --  25  --  23  --   ?GLUCOSE  --  119*  --  134*  --   ?BUN  --  25*  --  26*  --   ?CREATININE  --  2.72*  --  2.90*  --   ?CALCIUM  --  8.8*  --  8.4*  --   ?MG 2.4  --   --   --   --   ? < > = values in this interval not displayed.  ? ?Lipid Panel:  ?Recent Labs  ?Lab 02/14/22 ?0352  ?CHOL 192  ?TRIG 309*  ?HDL 20*  ?CHOLHDL 9.6  ?VLDL 62*  ?LDLCALC 110*  ? ?HgbA1c:  ?Recent Labs  ?Lab 03/11/22 ?1751  ?HGBA1C 5.5  ? ?Urine Drug Screen:  ?Recent Labs  ?Lab Mar 11, 2022 ?1601  ?LABOPIA NONE DETECTED  ?COCAINSCRNUR NONE DETECTED  ?LABBENZ NONE DETECTED  ?AMPHETMU NONE DETECTED  ?THCU NONE DETECTED  ?LABBARB NONE DETECTED  ?  ?Alcohol Level  ?Recent Labs  ?Lab 2022-03-11 ?1242  ?ETH <10  ? ? ?IMAGING past 24 hours ?CT ANGIO HEAD  NECK W WO CM ? ?Result Date: 03-11-2022 ?CLINICAL DATA:  Stroke, hemorrhagic r/o aneurysm, ICH EXAM: CT ANGIOGRAPHY HEAD AND NECK TECHNIQUE: Multidetector CT imaging of the head and neck was performed using the standard protocol during bolus administration of intravenous contrast. Multiplanar CT image reconstructions and MIPs were obtained to evaluate the vascular anatomy. Carotid stenosis measurements (when applicable) are obtained utilizing NASCET criteria, using the distal internal carotid diameter as the denominator. RADIATION DOSE REDUCTION: This exam was performed according to the departmental dose-optimization program which includes automated exposure control, adjustment of the mA and/or kV according to patient size and/or use of iterative reconstruction technique. CONTRAST:  34mL OMNIPAQUE IOHEXOL 350 MG/ML SOLN COMPARISON:  None. FINDINGS: Poor arterial evaluation due to contrast bolus timing superimposed on noise/artifact related to body habitus. CTA NECK Aortic arch:  Great vessel origins are patent. Right carotid system: Poorly evaluated. Left carotid system: Poorly evaluated. Trace calcified plaque at the bifurcation. Vertebral arteries: Poorly evaluated. Skeleton: No acute abnormality. Other neck: Unremarkable. Upper chest: No apical lung mass. Review of the MIP images confirms the above findings CTA HEAD Anterior circulation: Poorly evaluated. Posterior circulation:  Poorly evaluated. Venous sinuses: Patent. Review of the MIP images confirms the above findings IMPRESSION: Nondiagnostic exam for arterial evaluation. Electronically Signed   By: Guadlupe SpanishPraneil  Patel M.D.   On: 02/01/2022 15:04  ? ?CT HEAD WO CONTRAST (5MM) ? ?Result Date: 01/28/2022 ?CLINICAL DATA:  Follow-up intracranial hemorrhage EXAM: CT HEAD WITHOUT CONTRAST TECHNIQUE: Contiguous axial images were obtained from the base of the skull through the vertex without intravenous contrast. RADIATION DOSE REDUCTION: This exam was performed according  to the departmental dose-optimization program which includes automated exposure control, adjustment of the mA and/or kV according to patient size and/or use of iterative reconstruction technique. COMPARISON:  01/23/2022 FINDINGS: Brain: Redemonstrated parenchymal hemorrhage involving the left corona radiata, subinsular white matter, and lentiform nucleus, which measures up to 0.1 x 4.0 x 4.6 cm (AP x TR x CC) (series 2, image 23 and series 4, image 42), previously 6.1 x 4.0 x 4.6 cm, grossly unchanged. Mildly increased surrounding edema with mildly increased mass effect and 9 mm of left-to-right midline shift, previously 8 mm when remeasured similarly. No hydrocephalus or significant change in the size of the temporal horns. No acute cortical infarction, mass, or extra-axial collection. No hemorrhage is seen within the ventricles. Vascular: No hyperdense vessel. Atherosclerotic calcifications in the intracranial carotid and vertebral arteries. Skull: No acute osseous abnormality. Sinuses/Orbits: Left maxillary mucous retention cyst. Mild mucosal thickening in the ethmoid air cells. The orbits are unremarkable. Other: The mastoids are well aerated. IMPRESSION: Overall unchanged size of left cerebral intraparenchymal hemorrhage, with slightly increased surrounding edema and slight increase in left-to-right midline shift, now 9 mm, previously 8 mm when remeasured similarly. Electronically Signed   By: Wiliam KeAlison  Vasan M.D.   On: 01/20/2022 22:15  ? ?US RENAL ? ?Result Date: 02/12/2022 ?CLINICAL DATA:  Acute renal failure, history hypertension EXAM: RENAL / URINARY TRACT ULTRASOUND COMPLETE COMPARISON:  None FINDINGS: Right Kidney: Renal measurements: 11.5 x 5.4 x 5.4 cm = volume: 174 mL. Normal cortical thickness. Increased cortical echogenicity. No mass, hydronephrosis, or shadowing calcification. Left Kidney: Renal measurements: 11.3 x 5.7 x 6.6 cm = volume: 200 mL. Normal cortical thickness. Increased cortical  echogenicity. Tiny mid renal cyst 13 mm diameter; no follow-up imaging recommended. No additional mass, hydronephrosis, or shadowing calcification. Bladder: Appears normal for degree of bladder distention. Other: N/A IMPRESSION: Medical renal disease changes of both kidneys. No other renal sonographic abnormalities. Electronically Signed   By: Ulyses SouthwardMark  Boles M.D.   On: 01/28/2022 19:52  ? ?DG CHEST PORT 1 VIEW ? ?Result Date: 02/07/2022 ?CLINICAL DATA:  Respiratory difficulty. EXAM: PORTABLE CHEST 1 VIEW COMPARISON:  Radiograph 08/31/2019. Lung apices from neck CTA earlier today FINDINGS: The heart is enlarged. There is mild peribronchial thickening. No confluent consolidation. Minimal blunting of left costophrenic angle may represent small effusion. No pneumothorax. No acute osseous abnormalities are seen. IMPRESSION: 1. Cardiomegaly. Mild peribronchial thickening, may represent pulmonary edema or bronchitis. 2. Possible small left pleural effusion. Electronically Signed   By: Narda RutherfordMelanie  Sanford M.D.   On: 01/27/2022 15:29  ? ?CT HEAD CODE STROKE WO CONTRAST ? ?Result Date: 01/19/2022 ?CLINICAL DATA:  Code stroke. Neuro deficit, acute, stroke suspected, right-sided deficits  EXAM: CT HEAD WITHOUT CONTRAST TECHNIQUE: Contiguous axial images were obtained from the base of the skull through the vertex without intravenous contrast. RADIATION DOSE REDUCTION: This exam was performed according to the departmental dose-optimization program which includes automated exposure control, adjustment of the mA and/or kV according to patient size and/or use of iterative reconstruction technique. COMPARISON:  None. FINDINGS: Brain: Parenchymal hemorrhage involving the left corona radiata, subinsular white matter, and lentiform nucleus. This measures about 6.1 x 4.1 x 4.6 cm. Mild surrounding edema. There is no evidence of intraventricular extension. Regional mass effect is present. There is mild rightward midline shift measuring 4 mm.  Suspect early trapping of the right lateral ventricle. Gray-white differentiation is preserved.  No extra-axial collection. Vascular: No hyperdense vessel. Skull: Unremarkable. Sinuses/Orbits: Small left maxillary si

## 2022-02-14 NOTE — Progress Notes (Signed)
Dr. Derry Lory updated about patient's sodium of 138 and potassium of 3.1. Orders to increase hypertonic saline to 75 mL/hour.  ?

## 2022-02-14 NOTE — Progress Notes (Signed)
PT Cancellation Note ? ?Patient Details ?Name: Clinton Taylor ?MRN: [id number] ?DOB: 04-May-1986 ? ? ?Cancelled Treatment:    Reason Eval/Treat Not Completed: Medical issues which prohibited therapy this afternoon. Pt with ongoing respiratory difficulties today and per RN, just got situation on bipap. Will plan to return for PT evaluation as time/schedule allow.  ? ?Vickki Muff, PT, DPT  ? ?Acute Rehabilitation Department ?Pager #: [phone] - [phone] ? ? ?[name] ?02/14/2022, 4:34 PM ?

## 2022-02-14 NOTE — Progress Notes (Signed)
Dr. Derry Lory paged about patient's blood pressure staying above 150, despite cleviprex at max rate and PRN labetalol given. Orders given for IV hydralazine 20 mg. Will continue to monitor.  ?
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
## 2022-02-14 NOTE — Progress Notes (Addendum)
?PROGRESS NOTE ? ? ? ?Clinton GaussBryan Zolman  ZOX:[id number] DOB: 1986-04-11 DOA: 01/18/2022 ?PCP: Georgina QuintSagardia, Miguel Jose, MD  ? ? ?Brief Narrative:  ? ?Clinton Taylor is a 36 year old male with past medical history significant for essential hypertension, OSA on CPAP, mood disorder, tobacco use disorder, morbid obesity who presented to [facility] ED via EMS after developing right-sided flaccid paralysis when driving home today.  Patient reports he was driving home from the gas station and when he arrived in his driveway approximately 11:45 AM, patient was unable to get out of the vehicle.  He reports right-sided upper and lower extremity weakness with loss of sensation as well as right-sided facial droop.  Patient has a history of poorly controlled blood pressure, not on anticoagulation outpatient. ?  ?Patient reports many of his coworkers became sick last week and were diagnosed with COVID.  Patient reports nonproductive cough since.  Not oxygen dependent at baseline but utilizes CPAP nightly and is an active cigarette smoker smoking 1 pack/day.  Is on vaccinated against COVID. ?  ?In the ED, code stroke was initiated and evaluated by neurology.  Temperature 98.2 ?F, HR 78, RR 27, BP 201/138, SPO2 90% on room air.  Sodium 138, potassium 2.9, chloride 103, CO2 23, glucose 125, BUN 29, creatinine 2.59.  AST 26, ALT 26, total bilirubin 0.2.  WBC 15.2, hemoglobin 13.9, platelets 275.  COVID-19 PCR positive.  Influenza A/B PCR negative.  EtOH level less than 10.  Chest x-ray with cardiomegaly, mild peribronchial thickening consistent with pulmonary edema versus bronchitis, possible small left pleural effusion.  CT head code stroke with acute left cerebral parenchymal hemorrhage, no intraventricular extension, mild rightward midline shift with probable early trapping of the right lateral ventricle.  Patient was started on a Cleviprex drip and admitted to the neuro ICU service.  Hospitalist service consulted for evaluation of positive  COVID test. ? ?Assessment & Plan: ?  ?Acute intracranial hemorrhage ?Patient presenting to the ED following flaccid paralysis to right upper/lower extremity with right-sided facial droop with loss of sensation at approximately 11:45 AM.  Code stroke was initiated on arrival and CT head remarkable for acute left cerebral parenchymal hemorrhage with no intraventricular extension, mild rightward midline shift and probable early trapping of the right lateral ventricle. Etiology likely secondary to underlying malignant hypertension.  Not on anticoagulation/antiplatelet outpatient. ?-- Repeat CT head with overall unchanged size of left cerebral intraparenchymal hemorrhage with slightly increased surrounding edema and slight increase in left-right midline shift, now 9 mm from 8 mm. ?--Management per neuro ICU team ?  ?Malignant hypertension ?Patient reports compliance with amlodipine, losartan/HCTZ outpatient.  Blood pressure elevated 201/138 on admission. ?--Cleviprex drip ?--Labetalol drip ?--Amlodipine 10 mg p.o. daily ?--Goal SBP <150 per neurology ?  ?Acute renal failure ?Creatinine elevated 2.59.  No other values available for review in EMR.  Etiology likely secondary to poorly controlled hypertension complicated by use of ARB/HCTZ outpatient.  Renal ultrasound consistent with medical renal disease, no obstruction. ?--Cr 2.59>2.72>2.90 ?--Urine output 1.6 L past 24 hours ?--Strict I's and O's, monitor urinary output closely ?--Avoid nephrotoxins, renal dose all medications ?--BMP daily ?  ?Acute hypoxic respiratory failure ?COVID-19 viral infection ?Patient reported sick contacts with multiple members at work that were diagnosed with COVID.  Patient is unvaccinated.  Also history of prolonged tobacco abuse with 1 pack/day, no formal diagnosis of lung disease.  Given patient's hypoxia on presentation and unvaccinated status with risk factors of hypertension, obesity, tobacco abuse; would be reasonable to initiate  treatment with remdesivir and steroids.  Discussed with patient and spouse at bedside, they decline remdesivir infusion but okay with IV steroids. ?--CRP 2.3>2.6 ?--ddimer 0.82>0.67 ?--Decadron IV x 10 days ?--Combivent 1 puff every 6 hours ?--Zinc, vitamin C ?--Incentive spirometry, flutter valve ?--Continue supplemental oxygen, maintain SPO2 greater than 92% ?--CRP, ferritin, D-dimer daily ? ?Hypokalemia ?Potassium 3.1, will replete. ?--Repeat electrolytes in a.m. to include magnesium ?  ?Tobacco use disorder ?Continue to smoke 1 pack/day.  Counseled on need for complete cessation. ?--Nicotine patch ?  ?Mood disorder ?Anxiety/depression ?Continue home Celexa, Abilify ?  ?Morbid obesity ?Body mass index is 44.84 kg/m?Marland Kitchen  Patient currently follows with weight loss provider outpatient, recently started Saxenda and liothyronine.  Discussed with patient needs for aggressive lifestyle changes/weight loss as this complicates all facets of care.  ?  ?OSA ?--Continue nocturnal CPAP and while sleeping ?  ?  ?  ?Thank you for the opportunity to participate in the care of this [name].  The [facility] hospitalist team will continue to follow the patient with you. ? ? ?DVT prophylaxis: SCD's Start: 01/27/2022 1438 ? ?  Code Status: Full Code ?Family Communication: Spouse present at bedside this morning ? ?Disposition Plan:  ?Level of care: ICU ?Status is: Inpatient ?  ? ?Procedures:  ?Renal ultrasound ?TTE: Pending ? ?Antimicrobials:  ?None ? ? ?Subjective: ?Patient seen examined bedside, lying in bed.  Anxious.  Spouse present.  Asking when he can go home.  Reports poor sleep overnight.  Remains on Cleviprex drip with blood pressures difficult to control.  Remains with flaccid paralysis right upper/lower extremity with loss of sensation, relatively unchanged since yesterday.  No other specific questions or concerns at this time.  Denies chest pain, no palpitations, no cough, no congestion, no abdominal pain, no fatigue.  Other  than difficult control of BP overnight, no other acute concerns per nursing staff this morning. ? ?Objective: ?Vitals:  ? 02/14/22 0625 02/14/22 0630 02/14/22 0645 02/14/22 0700  ?BP: (!) 164/95 (!) 158/102 (!) 163/96 (!) 162/95  ?Pulse:  91 92 97  ?Resp:  (!) 24 (!) 23 (!) 22  ?Temp:      ?TempSrc:      ?SpO2:  (!) 88% 91% 93%  ?Weight:      ?Height:      ? ? ?Intake/Output Summary (Last 24 hours) at 02/14/2022 1114 ?Last data filed at 02/14/2022 0700 ?Gross per 24 hour  ?Intake 2571.08 ml  ?Output 1625 ml  ?Net 946.08 ml  ? ?Filed Weights  ? 01/17/2022 1258  ?Weight: (!) 176 kg  ? ? ?Examination: ? ?Physical Exam: ?GEN: NAD, alert and oriented x 3, morbidly obese ?HEENT: NCAT, PERRL, EOMI, sclera clear, MMM, noted right-sided facial droop ?PULM: Decreased breath sounds bilateral bases with mild wheezing, normal respiratory effort, on 4 L nasal cannula with SPO2 92% at rest ?CV: RRR w/o M/G/R ?GI: abd soft, NTND, NABS, no R/G/M ?MSK: no peripheral edema, muscle strength 0/5 right upper/lower extremity, muscle strength globally intact left upper/lower extremity ?PSYCH: normal mood/affect ?Integumentary: dry/intact, no rashes or wounds ?  ?Neuro Exam ?Mental Status: A&O x4, + dysarthria, no aphasia concentration and attention were appropriate ?Cranial Nerves: visual fields full, PERRL, EOMi, intact smooth pursuit, no nystagmus, facial sensation intact on left but absent right side, 5/5 jaw strength, smile asymmetric on the right,   eye closure symetric, hearing symmetric and normal to rubbing fingers, palate elevates symmetrically, tongue protrusion is midline ?Motor: LUE 5/5,   LLE 5/5,  RUE 0/5,   RLE 0/5   ?normal muscle bulk no significant atrophy, normal tone, no spasticity or rigidity apperciated  ?Sensory: Sensation is grossly absent/decreased right face, right upper extremity, right chest/flank, right lower extremity  ?Coordination/Movement: no tremor noted ? ? ? ?Data Reviewed: I have personally reviewed  following labs and imaging studies ? ?CBC: ?Recent Labs  ?Lab 02/02/2022 ?1242 02/14/22 ?0352  ?WBC 15.2* 12.4*  ?NEUTROABS 9.4* 10.9*  ?HGB 13.9 13.0  ?HCT 43.6 38.5*  ?MCV 79.0* 76.7*  ?PLT 275 249  ? ?Basic Me

## 2022-02-15 ENCOUNTER — Inpatient Hospital Stay (HOSPITAL_COMMUNITY): Payer: BC Managed Care – PPO

## 2022-02-15 DIAGNOSIS — I161 Hypertensive emergency: Secondary | ICD-10-CM

## 2022-02-15 DIAGNOSIS — N179 Acute kidney failure, unspecified: Secondary | ICD-10-CM | POA: Diagnosis not present

## 2022-02-15 DIAGNOSIS — I10 Essential (primary) hypertension: Secondary | ICD-10-CM | POA: Diagnosis not present

## 2022-02-15 DIAGNOSIS — J1282 Pneumonia due to coronavirus disease 2019: Secondary | ICD-10-CM

## 2022-02-15 DIAGNOSIS — J9601 Acute respiratory failure with hypoxia: Secondary | ICD-10-CM | POA: Diagnosis not present

## 2022-02-15 DIAGNOSIS — U071 COVID-19: Secondary | ICD-10-CM

## 2022-02-15 DIAGNOSIS — I610 Nontraumatic intracerebral hemorrhage in hemisphere, subcortical: Secondary | ICD-10-CM | POA: Diagnosis not present

## 2022-02-15 DIAGNOSIS — F39 Unspecified mood [affective] disorder: Secondary | ICD-10-CM | POA: Diagnosis not present

## 2022-02-15 LAB — COMPREHENSIVE METABOLIC PANEL
ALT: 19 U/L (ref 0–44)
AST: 17 U/L (ref 15–41)
Albumin: 3 g/dL — ABNORMAL LOW (ref 3.5–5.0)
Alkaline Phosphatase: 60 U/L (ref 38–126)
Anion gap: 7 (ref 5–15)
BUN: 28 mg/dL — ABNORMAL HIGH (ref 6–20)
CO2: 21 mmol/L — ABNORMAL LOW (ref 22–32)
Calcium: 8.6 mg/dL — ABNORMAL LOW (ref 8.9–10.3)
Chloride: 120 mmol/L — ABNORMAL HIGH (ref 98–111)
Creatinine, Ser: 3.37 mg/dL — ABNORMAL HIGH (ref 0.61–1.24)
GFR, Estimated: 23 mL/min — ABNORMAL LOW (ref >60)
Glucose, Bld: 113 mg/dL — ABNORMAL HIGH (ref 70–99)
Potassium: 3.9 mmol/L (ref 3.5–5.1)
Sodium: 148 mmol/L — ABNORMAL HIGH (ref 135–145)
Total Bilirubin: 0.3 mg/dL (ref 0.3–1.2)
Total Protein: 6.2 g/dL — ABNORMAL LOW (ref 6.5–8.1)

## 2022-02-15 LAB — CBC WITH DIFFERENTIAL/PLATELET
Abs Immature Granulocytes: 0.08 10*3/uL — ABNORMAL HIGH (ref 0.00–0.07)
Basophils Absolute: 0 10*3/uL (ref 0.0–0.1)
Basophils Relative: 0 %
Eosinophils Absolute: 0 10*3/uL (ref 0.0–0.5)
Eosinophils Relative: 0 %
HCT: 36.4 % — ABNORMAL LOW (ref 39.0–52.0)
Hemoglobin: 11.4 g/dL — ABNORMAL LOW (ref 13.0–17.0)
Immature Granulocytes: 1 %
Lymphocytes Relative: 9 %
Lymphs Abs: 1.1 10*3/uL (ref 0.7–4.0)
MCH: 25.2 pg — ABNORMAL LOW (ref 26.0–34.0)
MCHC: 31.3 g/dL (ref 30.0–36.0)
MCV: 80.5 fL (ref 80.0–100.0)
Monocytes Absolute: 0.6 10*3/uL (ref 0.1–1.0)
Monocytes Relative: 5 %
Neutro Abs: 11 10*3/uL — ABNORMAL HIGH (ref 1.7–7.7)
Neutrophils Relative %: 85 %
Platelets: 232 10*3/uL (ref 150–400)
RBC: 4.52 MIL/uL (ref 4.22–5.81)
RDW: 17.7 % — ABNORMAL HIGH (ref 11.5–15.5)
WBC: 12.9 10*3/uL — ABNORMAL HIGH (ref 4.0–10.5)
nRBC: 0 % (ref 0.0–0.2)

## 2022-02-15 LAB — GLUCOSE, CAPILLARY
Glucose-Capillary: 115 mg/dL — ABNORMAL HIGH (ref 70–99)
Glucose-Capillary: 116 mg/dL — ABNORMAL HIGH (ref 70–99)
Glucose-Capillary: 122 mg/dL — ABNORMAL HIGH (ref 70–99)
Glucose-Capillary: 122 mg/dL — ABNORMAL HIGH (ref 70–99)
Glucose-Capillary: 123 mg/dL — ABNORMAL HIGH (ref 70–99)
Glucose-Capillary: 126 mg/dL — ABNORMAL HIGH (ref 70–99)

## 2022-02-15 LAB — TSH: TSH: 0.797 u[IU]/mL (ref 0.350–4.500)

## 2022-02-15 LAB — SODIUM
Sodium: 151 mmol/L — ABNORMAL HIGH (ref 135–145)
Sodium: 152 mmol/L — ABNORMAL HIGH (ref 135–145)
Sodium: 155 mmol/L — ABNORMAL HIGH (ref 135–145)

## 2022-02-15 LAB — T4, FREE: Free T4: 0.97 ng/dL (ref 0.61–1.12)

## 2022-02-15 LAB — UREA NITROGEN, URINE: Urea Nitrogen, Ur: 400 mg/dL

## 2022-02-15 LAB — FERRITIN: Ferritin: 93 ng/mL (ref 24–336)

## 2022-02-15 LAB — D-DIMER, QUANTITATIVE: D-Dimer, Quant: 0.87 ug/mL-FEU — ABNORMAL HIGH (ref 0.00–0.50)

## 2022-02-15 LAB — C-REACTIVE PROTEIN: CRP: 2.6 mg/dL — ABNORMAL HIGH (ref <1.0)

## 2022-02-15 LAB — TRIGLYCERIDES: Triglycerides: 270 mg/dL — ABNORMAL HIGH (ref <150)

## 2022-02-15 LAB — MAGNESIUM: Magnesium: 2.3 mg/dL (ref 1.7–2.4)

## 2022-02-15 LAB — CBG MONITORING, ED: Glucose-Capillary: 141 mg/dL — ABNORMAL HIGH (ref 70–99)

## 2022-02-15 MED ORDER — SODIUM CHLORIDE 0.9 % IV SOLN
500.0000 mg | INTRAVENOUS | Status: AC
  Administered 2022-02-15 – 2022-02-19 (×5): 500 mg via INTRAVENOUS
  Filled 2022-02-15 (×5): qty 5

## 2022-02-15 MED ORDER — DIPHENHYDRAMINE HCL 50 MG/ML IJ SOLN
25.0000 mg | Freq: Once | INTRAMUSCULAR | Status: AC
Start: 2022-02-15 — End: 2022-02-15
  Administered 2022-02-15: 25 mg via INTRAVENOUS
  Filled 2022-02-15: qty 1

## 2022-02-15 MED ORDER — LACTATED RINGERS IV BOLUS: 1000.0000 mL | Freq: Once | INTRAVENOUS | Status: DC

## 2022-02-15 MED ORDER — SODIUM CHLORIDE 0.9 % IV BOLUS
1000.0000 mL | Freq: Once | INTRAVENOUS | Status: AC
  Administered 2022-02-15: 1000 mL via INTRAVENOUS

## 2022-02-15 MED ORDER — HYDRALAZINE HCL 20 MG/ML IJ SOLN
10.0000 mg | Freq: Three times a day (TID) | INTRAMUSCULAR | Status: DC
  Administered 2022-02-15 – 2022-02-16 (×3): 10 mg via INTRAVENOUS
  Filled 2022-02-15 (×3): qty 1

## 2022-02-15 MED ORDER — SODIUM CHLORIDE 0.9 % IV SOLN
2.0000 g | INTRAVENOUS | Status: AC
  Administered 2022-02-15 – 2022-02-21 (×7): 2 g via INTRAVENOUS
  Filled 2022-02-15 (×7): qty 20

## 2022-02-15 NOTE — Progress Notes (Signed)
OT Cancellation Note ? ?Patient Details ?Name: Clinton Taylor ?MRN: [id number] ?DOB: 12/24/1985 ? ? ?Cancelled Treatment:    Reason Eval/Treat Not Completed: Active bedrest order (OT evaluation to f/u once activity orders are progressed) ? ?[name] ?02/15/2022, 8:06 AM ?

## 2022-02-15 NOTE — Progress Notes (Signed)
STROKE TEAM PROGRESS NOTE  ? ?INTERVAL HISTORY ?Wife at the bedside. Pt on BiPAP, sleepy and drowsy, but still able to open eyes on voice and follows most simple commands. NPO now, pending CT in pm. On 3% saline and Na 151. Kidney function worsened overnight. Urine output is low.  ? ?Vitals:  ? 02/15/22 1100 02/15/22 1130 02/15/22 1200 02/15/22 1216  ?BP: (!) 156/84 137/72 140/71 (!) 142/77  ?Pulse: 70 72 71 77  ?Resp: 18 (!) 21 (!) 22 (!) 24  ?Temp:      ?TempSrc:      ?SpO2: 98% 93% 94% 94%  ?Weight:      ?Height:      ? ?CBC:  ?Recent Labs  ?Lab 02/14/22 ?0352 02/15/22 ?0452  ?WBC 12.4* 12.9*  ?NEUTROABS 10.9* 11.0*  ?HGB 13.0 11.4*  ?HCT 38.5* 36.4*  ?MCV 76.7* 80.5  ?PLT 249 232  ? ?Basic Metabolic Panel:  ?Recent Labs  ?Lab 10-01-2022 ?1751 10-01-2022 ?2042 02/14/22 ?[id number] 02/14/22 ?[id number] 02/15/22 ?[id number] 02/15/22 ?[id number]  ?NA 138   < > 138   < > 148* 151*  ?K  --    < > 3.1*  --  3.9  --   ?CL  --    < > 108  --  120*  --   ?CO2  --    < > 23  --  21*  --   ?GLUCOSE  --    < > 134*  --  113*  --   ?BUN  --    < > 26*  --  28*  --   ?CREATININE  --    < > 2.90*  --  3.37*  --   ?CALCIUM  --    < > 8.4*  --  8.6*  --   ?MG 2.4  --   --   --  2.3  --   ? < > = values in this interval not displayed.  ? ?Lipid Panel:  ?Recent Labs  ?Lab 02/14/22 ?0352 02/15/22 ?0452  ?CHOL 192  --   ?TRIG 309* 270*  ?HDL 20*  --   ?CHOLHDL 9.6  --   ?VLDL 62*  --   ?LDLCALC 110*  --   ? ?HgbA1c:  ?Recent Labs  ?Lab 10-01-2022 ?1751  ?HGBA1C 5.5  ? ?Urine Drug Screen:  ?Recent Labs  ?Lab 10-01-2022 ?1601  ?LABOPIA NONE DETECTED  ?COCAINSCRNUR NONE DETECTED  ?LABBENZ NONE DETECTED  ?AMPHETMU NONE DETECTED  ?THCU NONE DETECTED  ?LABBARB NONE DETECTED  ?  ?Alcohol Level  ?Recent Labs  ?Lab 10-01-2022 ?1242  ?ETH <10  ? ? ?IMAGING past 24 hours ?CT HEAD WO CONTRAST (5MM) ? ?Result Date: 02/14/2022 ?CLINICAL DATA:  Follow-up intracranial hemorrhage. EXAM: CT HEAD WITHOUT CONTRAST TECHNIQUE: Contiguous axial images were obtained from the base of the  skull through the vertex without intravenous contrast. RADIATION DOSE REDUCTION: This exam was performed according to the departmental dose-optimization program which includes automated exposure control, adjustment of the mA and/or kV according to patient size and/or use of iterative reconstruction technique. COMPARISON:  2021/11/04 FINDINGS: Brain: Again noted is a large parenchymal hematoma within the left basal ganglia measuring 6.0 by 3.5 by 4.5 cm, image 40/4 and image 17/2. This is not significantly changed in size from the previous exam. There is mildly increased surrounding low attenuation edema. Mass effect upon the overlying brain parenchyma is identified. Midline shift from left to right is again noted. This measures 7 mm, image 16/2. Previously this  was measured at 9 mm. No signs of intraventricular hemorrhage or hydronephrosis. No new areas of intracranial hemorrhage identified. Vascular: No hyperdense vessel or unexpected calcification. Skull: Normal. Negative for fracture or focal lesion. Sinuses/Orbits: Left maxillary sinus retention cyst. Mild mucosal thickening within the ethmoid air cells. Mastoid air cells are clear. Other: None IMPRESSION: 1. No significant change in size of left basal ganglia parenchymal hematoma with mildly surrounding low attenuation edema. 2. Left right midline shift measures 7 mm on today's study. Previously 9 mm. Electronically Signed   By: Signa Kell M.D.   On: 02/14/2022 15:48  ? ?ECHOCARDIOGRAM COMPLETE ? ?Result Date: 02/14/2022 ?   ECHOCARDIOGRAM REPORT   Patient Name:   Clinton Taylor Date of Exam: 02/14/2022 Medical Rec #:  [id number]        Height:       78.0 in Accession #:    [id number]       Weight:       388.0 lb Date of Birth:  15-Sep-1986       BSA:          2.992 m? Patient Age:    35 years         BP:           141/83 mmHg Patient Gender: M                HR:           77 bpm. Exam Location:  Inpatient Procedure: 2D Echo, Cardiac Doppler, Color Doppler  and Intracardiac            Opacification Agent Indications:    CVA  History:        Patient has no prior history of Echocardiogram examinations.                 Risk Factors:Hypertension.  Sonographer:    Neomia Dear RDCS Referring Phys: [id number] Gordy Councilman  Sonographer Comments: Technically difficult study due to poor echo windows, patient is morbidly obese and echo performed with patient supine and on artificial respirator. Image acquisition challenging due to patient body habitus. IMPRESSIONS  1. Left ventricular ejection fraction, by estimation, is 60 to 65%. The left ventricle has normal function. The left ventricle has no regional wall motion abnormalities. There is moderate left ventricular hypertrophy. Left ventricular diastolic parameters are indeterminate. Elevated left atrial pressure.  2. RV not well visualized, grossly appears normal in size and function. . Right ventricular systolic function was not well visualized. The right ventricular size is not well visualized. Tricuspid regurgitation signal is inadequate for assessing PA pressure.  3. The mitral valve was not well visualized. No evidence of mitral valve regurgitation. No evidence of mitral stenosis.  4. The aortic valve was not well visualized. Aortic valve regurgitation is not visualized. No aortic stenosis is present. FINDINGS  Left Ventricle: Left ventricular ejection fraction, by estimation, is 60 to 65%. The left ventricle has normal function. The left ventricle has no regional wall motion abnormalities. The left ventricular internal cavity size was normal in size. There is  moderate left ventricular hypertrophy. Left ventricular diastolic parameters are indeterminate. Elevated left atrial pressure. Right Ventricle: RV not well visualized, grossly appears normal in size and function. The right ventricular size is not well visualized. Right vetricular wall thickness was not well visualized. Right ventricular systolic function was not  well visualized.  Tricuspid regurgitation signal is inadequate for assessing PA pressure. Left Atrium: Left atrial size was not well  visualized. Right Atrium: Right atrial size was not well visualized. Pericardium: The pericardium was not well visualized. Mitral Valve: The mitral valve was not well visualized. No evidence of mitral valve regurgitation. No evidence of mitral valve stenosis. Tricuspid Valve: The tricuspid valve is not well visualized. Tricuspid valve regurgitation is not demonstrated. No evidence of tricuspid stenosis. Aortic Valve: The aortic valve was not well visualized. Aortic valve regurgitation is not visualized. No aortic stenosis is present. Aortic valve mean gradient measures 4.0 mmHg. Aortic valve peak gradient measures 7.1 mmHg. Pulmonic Valve: The pulmonic valve was not well visualized. Pulmonic valve regurgitation is not visualized. No evidence of pulmonic stenosis. Aorta: The aortic root is normal in size and structure. IAS/Shunts: The interatrial septum was not well visualized.  LEFT VENTRICLE PLAX 2D LVIDd:         6.90 cm   Diastology LVIDs:         4.20 cm   LV e' medial:    4.40 cm/s LV PW:         1.40 cm   LV E/e' medial:  23.2 LV IVS:        1.30 cm   LV e' lateral:   4.74 cm/s LVOT diam:     2.70 cm   LV E/e' lateral: 21.5 LVOT Area:     5.73 cm?  LEFT ATRIUM              Index LA diam:        4.40 cm  1.47 cm/m? LA Vol (A2C):   109.0 ml 36.43 ml/m? LA Vol (A4C):   63.6 ml  21.26 ml/m? LA Biplane Vol: 86.4 ml  28.88 ml/m?  AORTIC VALVE              PULMONIC VALVE AV Vmax:      133.00 cm/s PV Vmax:       1.33 m/s AV Vmean:     90.500 cm/s PV Vmean:      90.500 cm/s AV VTI:       0.237 m     PV VTI:        0.253 m AV Peak Grad: 7.1 mmHg    PV Peak grad:  7.1 mmHg AV Mean Grad: 4.0 mmHg    PV Mean grad:  4.0 mmHg  AORTA Ao Root diam: 3.90 cm Ao Asc diam:  3.50 cm MITRAL VALVE                TRICUSPID VALVE MV Area (PHT): 4.31 cm?     TR Peak grad:   37.5 mmHg MV Decel Time: 176  msec     TR Vmax:        306.00 cm/s MV E velocity: 102.00 cm/s MV A velocity: 93.10 cm/s   SHUNTS MV E/A ratio:  1.10         Systemic Diam: 2.70 cm Dina Rich MD Electronically signed by Dina Rich

## 2022-02-15 NOTE — Progress Notes (Signed)
Pt waiting on CT appointment (ordered for 1400). CT states they are running behind and will send for him as soon as they can. ?

## 2022-02-15 NOTE — Progress Notes (Signed)
?PROGRESS NOTE ? ? ? ?Clinton GaussBryan Billey  ZOX:[id number] DOB: 07/07/1986 DOA: 05/20/2022 ?PCP: Georgina QuintSagardia, Miguel Jose, MD  ? ? ?Brief Narrative:  ? ?Clinton Taylor is a 36 year old male with past medical history significant for essential hypertension, OSA on CPAP, mood disorder, tobacco use disorder, morbid obesity who presented to [facility] ED via EMS after developing right-sided flaccid paralysis when driving home today.  Patient reports he was driving home from the gas station and when he arrived in his driveway approximately 11:45 AM, patient was unable to get out of the vehicle.  He reports right-sided upper and lower extremity weakness with loss of sensation as well as right-sided facial droop.  Patient has a history of poorly controlled blood pressure, not on anticoagulation outpatient. ?  ?Patient reports many of his coworkers became sick last week and were diagnosed with COVID.  Patient reports nonproductive cough since.  Not oxygen dependent at baseline but utilizes CPAP nightly and is an active cigarette smoker smoking 1 pack/day.  Is on vaccinated against COVID. ?  ?In the ED, code stroke was initiated and evaluated by neurology.  Temperature 98.2 ?F, HR 78, RR 27, BP 201/138, SPO2 90% on room air.  Sodium 138, potassium 2.9, chloride 103, CO2 23, glucose 125, BUN 29, creatinine 2.59.  AST 26, ALT 26, total bilirubin 0.2.  WBC 15.2, hemoglobin 13.9, platelets 275.  COVID-19 PCR positive.  Influenza A/B PCR negative.  EtOH level less than 10.  Chest x-ray with cardiomegaly, mild peribronchial thickening consistent with pulmonary edema versus bronchitis, possible small left pleural effusion.  CT head code stroke with acute left cerebral parenchymal hemorrhage, no intraventricular extension, mild rightward midline shift with probable early trapping of the right lateral ventricle.  Patient was started on a Cleviprex drip and admitted to the neuro ICU service.  Hospitalist service consulted for evaluation of positive  COVID test. ? ?Assessment & Plan: ?  ?Acute intracranial hemorrhage ?Patient presenting to the ED following flaccid paralysis to right upper/lower extremity with right-sided facial droop with loss of sensation at approximately 11:45 AM.  Code stroke was initiated on arrival and CT head remarkable for acute left cerebral parenchymal hemorrhage with no intraventricular extension, mild rightward midline shift and probable early trapping of the right lateral ventricle. Etiology likely secondary to underlying malignant hypertension.  Not on anticoagulation/antiplatelet outpatient. Repeat CT head with overall unchanged size of left cerebral intraparenchymal hemorrhage with slightly increased surrounding edema and slight increase in left-right midline shift, now 9 mm from 8 mm. ?--Management per neuro ICU team ?  ?Malignant hypertension ?Patient reports compliance with amlodipine, losartan/HCTZ outpatient.  Blood pressure elevated 201/138 on admission. ?--Cleviprex drip ?--Labetalol drip ?--Amlodipine 10 mg p.o. daily ?--Goal SBP <150 per neurology ?  ?Acute renal failure ?Creatinine elevated 2.59.  No other values available for review in EMR.  Etiology likely secondary to poorly controlled hypertension complicated by use of ARB/HCTZ outpatient.  Renal ultrasound consistent with medical renal disease, no obstruction. ?--Cr 2.59>2.72>2.90>3.37 ?--Urine output 1.4 L past 24 hours with 1 unmeasured occurrence ?--Strict I's and O's, monitor urinary output closely ?--Avoid nephrotoxins, renal dose all medications ?--BMP daily ?  ?Acute hypoxic respiratory failure ?COVID-19 viral infection ?Patient reported sick contacts with multiple members at work that were diagnosed with COVID.  Patient is unvaccinated.  Also history of prolonged tobacco abuse with 1 pack/day, no formal diagnosis of lung disease.  Given patient's hypoxia on presentation and unvaccinated status with risk factors of hypertension, obesity, tobacco abuse; would  be  reasonable to initiate treatment with remdesivir and steroids.  Discussed with patient and spouse at bedside, they decline remdesivir infusion but okay with IV steroids. ?--CRP 2.3>2.6>2.6 ?--ddimer 0.82>0.67>0.87 ?--Decadron IV x 10 days ?--Combivent 1 puff every 6 hours ?--Zinc, vitamin C ?--Incentive spirometry, flutter valve ?--Continue supplemental oxygen, maintain SPO2 greater than 92% ?--CRP, ferritin, D-dimer daily ? ?Hypokalemia ?Potassium 3.9 today ?--Repeat electrolytes in a.m. to include magnesium ?  ?Tobacco use disorder ?Continue to smoke 1 pack/day.  Counseled on need for complete cessation. ?--Nicotine patch ?  ?Mood disorder ?Anxiety/depression ?Continue home Celexa, Abilify ?  ?Morbid obesity ?Body mass index is 44.84 kg/m?Marland Kitchen  Patient currently follows with weight loss provider outpatient, recently started Saxenda and liothyronine.  Discussed with patient needs for aggressive lifestyle changes/weight loss as this complicates all facets of care.  ?  ?OSA ?--Continue nocturnal CPAP and while sleeping ?  ?  ?  ?Thank you for the opportunity to participate in the care of this [name].  Pulmonary critical care medicine now consulted and following.  Discussed with neurology NP this morning, hospitalist service will sign off at this time to avoid duplicity in medical management given PCCM involvement; as patient still requires ICU level of care on multiple IV drips and requiring BiPAP.  Feel free to call us back with any questions especially when stable for discharge out of the intensive care unit. ? ? ?DVT prophylaxis: SCD's Start: 2022/03/11 1438 ? ?  Code Status: Full Code ?Family Communication: No family present at bedside this morning ? ?Disposition Plan:  ?Level of care: ICU ?Status is: Inpatient ?  ? ?Procedures:  ?Renal ultrasound ?TTE: Pending ? ?Antimicrobials:  ?None ? ? ?Subjective: ?Patient seen examined bedside, lying in bed.  On BiPAP with FiO2 70%, sleeping but arousable.  No family  present.  Remains on Cleviprex and labetalol drip.  Remains with flaccid paralysis right upper/lower extremity with loss of sensation, relatively unchanged since admission.  Discussed with neurology NP, hospitalist service will sign off for now given continued ICU level of care with PCCM involvement. ? ?Objective: ?Vitals:  ? 02/15/22 0700 02/15/22 0800 02/15/22 0830 02/15/22 0834  ?BP: 122/69 126/68    ?Pulse: 81 68 68 69  ?Resp: (!) 22 20 20  (!) 21  ?Temp:  99.1 ?F (37.3 ?C)    ?TempSrc:  Axillary    ?SpO2: 95% (!) 89% 90% 90%  ?Weight:      ?Height:      ? ? ?Intake/Output Summary (Last 24 hours) at 02/15/2022 0949 ?Last data filed at 02/15/2022 0700 ?Gross per 24 hour  ?Intake 3466.94 ml  ?Output 1440 ml  ?Net 2026.94 ml  ? ?Filed Weights  ? 2022-03-11 1258 02/15/22 0441  ?Weight: (!) 176 kg (!) 161.6 kg  ? ? ?Examination: ? ?Physical Exam: ?GEN: NAD, alert and oriented x 3, morbidly obese ?HEENT: NCAT, PERRL, EOMI, sclera clear, MMM, noted right-sided facial droop ?PULM: Decreased breath sounds bilateral bases with mild wheezing, normal respiratory effort, BiPAP with FiO2 70% ?CV: RRR w/o M/G/R ?GI: abd soft, NTND, NABS, no R/G/M ?MSK: no peripheral edema, muscle strength 0/5 right upper/lower extremity, muscle strength globally intact left upper/lower extremity ?PSYCH: normal mood/affect ?Integumentary: dry/intact, no rashes or wounds ?  ?Neuro Exam ?Mental Status: A&O x4, + dysarthria, no aphasia concentration and attention were appropriate ?Cranial Nerves: visual fields full, PERRL, EOMi, no nystagmus, facial sensation intact on left but absent right side, 5/5 jaw strength, smile asymmetric on the right,   eye closure  symetric, hearing symmetric and normal to rubbing fingers, palate elevates symmetrically, tongue protrusion is midline ?Motor: LUE 5/5,   LLE 5/5,   RUE 0/5,   RLE 0/5   ?normal muscle bulk no significant atrophy, normal tone, no spasticity or rigidity apperciated  ?Sensory: Sensation is grossly  absent/decreased right face, right upper extremity, right chest/flank, right lower extremity  ?Coordination/Movement: no tremor noted ? ? ? ?Data Reviewed: I have personally reviewed following labs and

## 2022-02-15 NOTE — Progress Notes (Signed)
Neurologist notified that CT has not been done. ?

## 2022-02-15 NOTE — Progress Notes (Signed)
?  Transition of Care (TOC) Screening Note ? ? ?Patient Details  ?Name: Clinton Taylor ?Date of Birth: December 21, 1985 ? ? ?Transition of Care [facility]) CM/SW Contact:    ?Baldemar Lenis, LCSW ?Phone Number: ?02/15/2022, 3:11 PM ? ? ? ?Transition of Care Department [facility]) has reviewed patient and no TOC needs have been identified at this time; medical workup ongoing. We will continue to monitor patient advancement through interdisciplinary progression rounds. If new patient transition needs arise, please place a TOC consult. ?  ?

## 2022-02-15 NOTE — Progress Notes (Signed)
SLP Cancellation Note ? ?Patient Details ?Name: Clinton Taylor ?MRN: [id number] ?DOB: Oct 11, 1986 ? ? ?Cancelled treatment:       Reason Eval/Treat Not Completed: Medical issues which prohibited therapy; pt currently on Bipap; desaturating when removed; nursing deferred; ST will continue to assess pt ability to participate in tx as able. ? ? ?Tressie Stalker, M.S., CCC-SLP ?02/15/2022, 9:58 AM ?

## 2022-02-15 NOTE — Progress Notes (Signed)
PT Cancellation Note ? ?Patient Details ?Name: Clinton Taylor ?MRN: [id number] ?DOB: [birth year] ? ? ?Cancelled Treatment:    Reason Eval/Treat Not Completed: Active bedrest order. ? ?Lyanne Co, PT  ?Acute Rehab Services ? Pager [phone] ?Office [phone] ? ? ? ?Clinton Taylor ?02/15/2022, 9:43 AM ?

## 2022-02-15 NOTE — Progress Notes (Addendum)
? ?NAME:  Clinton Taylor, MRN:  [id number], DOB:  1986-02-28, LOS: 2 ?ADMISSION DATE:  02/07/2022, CONSULTATION DATE:  02/14/22 ?REFERRING MD:  Stroke team CHIEF COMPLAINT:  Hypoxemia  ? ? ?History of Present Illness:  ?36 year old [facility]CH with edema and midline shift and hypertensive emergency. Prior to admission to [facility] he developed right sided weakness while driving. EMS was called and in the ED code stroke was called. He was found with left IPH measuring 6.1 x 4.1x4.6 with mild edema and 4mm rightward midline shift, no IV extension. [facility]. Reportedly had concerns for aspiration and hypoxemia before [facility]. Wife at bedside reports COVID infection with his co-workers last week. Has had non-productive cough since then. COVID PCR positive. PCCM consulted for evaluation COVID infection, aspiration and respiratory management ?  ?He reports feeling short of breath which is improved on oxygen and CPAP. Did not sleep last night and feeling tired now. ? ?Past Medical History:  ?Former tobacco use, HTN, OSA, hiatal hernia, anxiety, depression, OCD ? ?Significant Hospital Events:  ?[facility] ?4/30 started on continuous BiPAP  ? ?Procedures:  ?None ? ?Significant Diagnostic Tests:  ?CT Head 4/30- IMPRESSION: No significant change in size of left basal ganglia parenchymal hematoma with mildly surrounding low attenuation edema. Left right midline shift measures 7 mm on today's study. Previously 9 mm. ? ? ?Interim History / Subjective:  ?Patient seen comfortably resting on BiPAP, wife at bedside. States he has been agitated and not sleeping well. Patient denies any pain. Asking for water.  ? ?Objective   ?Blood pressure 126/68, pulse 69, temperature 99.1 ?F (37.3 ?C), temperature source Axillary, resp. rate (!) 21, height 6\' 6"  (1.981 m), weight (!) 161.6 kg, SpO2 90 %. ?   ?FiO2 (%):  [60 %-70 %] 70 %  ? ?Intake/Output Summary  (Last 24 hours) at 02/15/2022 0840 ?Last data filed at 02/15/2022 0700 ?Gross per 24 hour  ?Intake 3466.94 ml  ?Output 1440 ml  ?Net 2026.94 ml  ? ?Filed Weights  ? 01/24/2022 1258 02/15/22 0441  ?Weight: (!) 176 kg (!) 161.6 kg  ? ? ?Examination: ?General: NAD, on BiPAP, drowsy but easily awakens  ?HENT: [place], AT, anicteric  ?Lungs: Diminished breath sounds bilaterally, on BiPAP ?Cardiovascular: RRR, no murmurs rubs or gallops ?Abdomen: sfot, nontender, bowel sounds present ?Extremities: no edema ?Neuro: drowsy but easily awakens, oriented x3, able to move both upper extremities and left LE, unable to move RLE ? ? ?Resolved Hospital Problem list   ? ? ?Assessment & Plan:  ?Left ICH, non-traumatic with right sided paralysis ?Cerebral edema ?HTN emergency ?Hypernatremia ?Repeat CT head on 4/30 shows stable left basal basal ganglia parenchymal hematoma with mild edema. Left midline shift 7 mm, previously 9 mm. Continuing to wean cleviprex. Given national shortage of labetalol may also need to adjust this regimen with scheduled hydral vs oral medications if able.  ?-Management and further imaging per Stroke team ?-Hypertonic saline, may need to wean given hypernatremia ?-Cleviprex gtt  ?-PRN hydralazine and labetalol ?-BP goal <160 ?-PT/OT/Speech ?  ?Acute hypoxemic respiratory failure ?COVID+, not vaccinated ?Possible pulmonary edema ?Former smoker - quit prior to admission. At least 20 pack-years ?Patient has required continuous BiPAP since yesterday afternoon. Attempted to wean off BiPAP to [place] this morning unsuccessfully. Will re attempt early afternoon and if patient fails again will likely need to be intubated.   ?-Goal SpO2 >85% ?-  S/p one dose of IV furosemide 40 mg  ?-Continue duonbes ?-Decadron x 10d. Patient declined remdesivir ?-Nicotine patch ?  ?OSA on CPAP ?Currently on continuous BiPAP, attempt to wean today, if fails will likely need to be intubated. ?  ?AKI   ?Likely exacerbated by HTN. Worsening Cr, 3.37<2.9.  Urinary output decreased, ~650 cc reported today. Continue to monitor closely. Renal ultrasound demonstrated medical renal disease. ?-Monitor UOP/Cr ?-Avoid nephrotoxic agents ?  ?Mood disorder ?Celexa, abilify when able ? ?Best practice (evaluated daily)  ?Diet: NPO ?Pain/Anxiety/Delirium protocol (if indicated): n/a ?VAP protocol (if indicated): n/a ?DVT prophylaxis: CI for ICH, SCDs ?GI prophylaxis: PPI ?Glucose control: n/a ?Mobility: as able ?Disposition: pending improvement ? ?Goals of Care:  ?Last date of multidisciplinary goals of care discussion:5/1 ?Family and staff present: wife at bedside ?Summary of discussion: plan to wean off BiPAP today but if fail may need intubation ?Follow up goals of care discussion due: 5/2 ?Code Status: Full code  ? ?Labs   ?CBC: ?Recent Labs  ?Lab 01/27/2022 ?1242 02/14/22 ?0352 02/15/22 ?0452  ?WBC 15.2* 12.4* 12.9*  ?NEUTROABS 9.4* 10.9* 11.0*  ?HGB 13.9 13.0 11.4*  ?HCT 43.6 38.5* 36.4*  ?MCV 79.0* 76.7* 80.5  ?PLT 275 249 232  ? ? ?Basic Metabolic Panel: ?Recent Labs  ?Lab 01/22/2022 ?1242 02/12/2022 ?1751 01/21/2022 ?2042 01/29/2022 ?2226 02/14/22 ?4496 02/14/22 ?7591 02/14/22 ?2206 02/15/22 ?6384  ?NA 138 138 137 139 138 140 145 148*  ?K 2.9*  --  3.4*  --  3.1*  --   --  3.9  ?CL 103  --  102  --  108  --   --  120*  ?CO2 23  --  25  --  23  --   --  21*  ?GLUCOSE 125*  --  119*  --  134*  --   --  113*  ?BUN 29*  --  25*  --  26*  --   --  28*  ?CREATININE 2.59*  --  2.72*  --  2.90*  --   --  3.37*  ?CALCIUM 8.9  --  8.8*  --  8.4*  --   --  8.6*  ?MG  --  2.4  --   --   --   --   --  2.3  ? ?GFR: ?Estimated Creatinine Clearance: 50.7 mL/min (A) (by C-G formula based on SCr of 3.37 mg/dL (H)). ?Recent Labs  ?Lab 02/11/2022 ?1242 02/14/22 ?0352 02/15/22 ?0452  ?WBC 15.2* 12.4* 12.9*  ? ? ?Liver Function Tests: ?Recent Labs  ?Lab 01/26/2022 ?1242 02/14/22 ?0352 02/15/22 ?0452  ?AST 26 20 17   ?ALT 26 23 19   ?ALKPHOS 88 69 60  ?BILITOT 0.8 0.4 0.3  ?PROT 8.1 6.8 6.2*  ?ALBUMIN 4.2 3.3*  3.0*  ? ?No results for input(s): LIPASE, AMYLASE in the last 168 hours. ?No results for input(s): AMMONIA in the last 168 hours. ? ?ABG ?   ?Component Value Date/Time  ? HCO3 25.4 02/03/2022 1801  ? O2SAT 87.2 01/21/2022 1801  ?  ? ?Coagulation Profile: ?Recent Labs  ?Lab 01/23/2022 ?1242  ?INR 1.0  ? ? ?Cardiac Enzymes: ?No results for input(s): CKTOTAL, CKMB, CKMBINDEX, TROPONINI in the last 168 hours. ? ?HbA1C: ?Hgb A1c MFr Bld  ?Date/Time Value Ref Range Status  ?02/03/2022 05:51 PM 5.5 4.8 - 5.6 % Final  ?  Comment:  ?  (NOTE) ?Pre diabetes:          5.7%-6.4% ? ?Diabetes:              >  6.4% ? ?Glycemic control for   <7.0% ?adults with diabetes ?  ?12/15/2020 04:57 PM 5.4 4.8 - 5.6 % Final  ?  Comment:  ?           Prediabetes: 5.7 - 6.4 ?         Diabetes: >6.4 ?         Glycemic control for adults with diabetes: <7.0 ?  ? ? ?CBG: ?Recent Labs  ?Lab 02/14/22 ?1457 02/14/22 ?2006 02/14/22 ?2334 02/15/22 ?0422 02/15/22 ?9373  ?GLUCAP 132* 134* 140* 126* 122*  ? ? ?Review of Systems:   ?Negative except as her HPI ? ?Past Medical History:  ?He,  has a past medical history of Anger, Anxiety, Depression, Hiatal hernia, Hypertension, Mood changes, and Obsessive compulsive disorder.  ? ?Surgical History:  ?No past surgical history on file.  ? ?Social History:  ? reports that he has quit smoking. His smoking use included cigarettes. He smoked an average of 1 pack per day. He has quit using smokeless tobacco.  His smokeless tobacco use included chew. He reports that he does not drink alcohol and does not use drugs.  ? ?Family History:  ?His family history includes Alcohol abuse in his father, maternal uncle, paternal grandmother, and paternal uncle; Bipolar disorder in his father; Drug abuse in his father and paternal grandmother; OCD in his maternal grandfather and mother.  ? ?Allergies ?No Known Allergies  ? ?Home Medications  ?Prior to Admission medications   ?Medication Sig Start Date End Date Taking? Authorizing  Provider  ?amLODipine (NORVASC) 10 MG tablet TAKE 1 TABLET(10 MG) BY MOUTH DAILY ?Patient taking differently: Take 10 mg by mouth daily. 12/17/21  Yes Georgina Quint, MD  ?ARIPiprazole (ABILIFY) 10 M

## 2022-02-15 DEATH — deceased

## 2022-02-16 DIAGNOSIS — I161 Hypertensive emergency: Secondary | ICD-10-CM | POA: Diagnosis not present

## 2022-02-16 DIAGNOSIS — U071 COVID-19: Secondary | ICD-10-CM | POA: Diagnosis not present

## 2022-02-16 DIAGNOSIS — I610 Nontraumatic intracerebral hemorrhage in hemisphere, subcortical: Secondary | ICD-10-CM | POA: Diagnosis not present

## 2022-02-16 DIAGNOSIS — J9601 Acute respiratory failure with hypoxia: Secondary | ICD-10-CM | POA: Diagnosis not present

## 2022-02-16 DIAGNOSIS — J1282 Pneumonia due to coronavirus disease 2019: Secondary | ICD-10-CM | POA: Diagnosis not present

## 2022-02-16 LAB — SODIUM
Sodium: 159 mmol/L — ABNORMAL HIGH (ref 135–145)
Sodium: 160 mmol/L — ABNORMAL HIGH (ref 135–145)

## 2022-02-16 LAB — D-DIMER, QUANTITATIVE: D-Dimer, Quant: 1.01 ug/mL-FEU — ABNORMAL HIGH (ref 0.00–0.50)

## 2022-02-16 LAB — COMPREHENSIVE METABOLIC PANEL
ALT: 25 U/L (ref 0–44)
AST: 26 U/L (ref 15–41)
Albumin: 3.1 g/dL — ABNORMAL LOW (ref 3.5–5.0)
Alkaline Phosphatase: 62 U/L (ref 38–126)
Anion gap: 9 (ref 5–15)
BUN: 34 mg/dL — ABNORMAL HIGH (ref 6–20)
CO2: 19 mmol/L — ABNORMAL LOW (ref 22–32)
Calcium: 8.6 mg/dL — ABNORMAL LOW (ref 8.9–10.3)
Chloride: 126 mmol/L — ABNORMAL HIGH (ref 98–111)
Creatinine, Ser: 3.56 mg/dL — ABNORMAL HIGH (ref 0.61–1.24)
GFR, Estimated: 22 mL/min — ABNORMAL LOW (ref >60)
Glucose, Bld: 106 mg/dL — ABNORMAL HIGH (ref 70–99)
Potassium: 4.1 mmol/L (ref 3.5–5.1)
Sodium: 154 mmol/L — ABNORMAL HIGH (ref 135–145)
Total Bilirubin: 0.5 mg/dL (ref 0.3–1.2)
Total Protein: 6.5 g/dL (ref 6.5–8.1)

## 2022-02-16 LAB — GLUCOSE, CAPILLARY
Glucose-Capillary: 117 mg/dL — ABNORMAL HIGH (ref 70–99)
Glucose-Capillary: 105 mg/dL — ABNORMAL HIGH (ref 70–99)
Glucose-Capillary: 109 mg/dL — ABNORMAL HIGH (ref 70–99)
Glucose-Capillary: 105 mg/dL — ABNORMAL HIGH (ref 70–99)
Glucose-Capillary: 115 mg/dL — ABNORMAL HIGH (ref 70–99)
Glucose-Capillary: 130 mg/dL — ABNORMAL HIGH (ref 70–99)

## 2022-02-16 LAB — CBC WITH DIFFERENTIAL/PLATELET
Abs Immature Granulocytes: 0.12 10*3/uL — ABNORMAL HIGH (ref 0.00–0.07)
Basophils Absolute: 0.1 10*3/uL (ref 0.0–0.1)
Basophils Relative: 0 %
Eosinophils Absolute: 0 10*3/uL (ref 0.0–0.5)
Eosinophils Relative: 0 %
HCT: 37.3 % — ABNORMAL LOW (ref 39.0–52.0)
Hemoglobin: 11.6 g/dL — ABNORMAL LOW (ref 13.0–17.0)
Immature Granulocytes: 1 %
Lymphocytes Relative: 8 %
Lymphs Abs: 0.9 10*3/uL (ref 0.7–4.0)
MCH: 25.3 pg — ABNORMAL LOW (ref 26.0–34.0)
MCHC: 31.1 g/dL (ref 30.0–36.0)
MCV: 81.3 fL (ref 80.0–100.0)
Monocytes Absolute: 0.4 10*3/uL (ref 0.1–1.0)
Monocytes Relative: 4 %
Neutro Abs: 10 10*3/uL — ABNORMAL HIGH (ref 1.7–7.7)
Neutrophils Relative %: 87 %
Platelets: 239 10*3/uL (ref 150–400)
RBC: 4.59 MIL/uL (ref 4.22–5.81)
RDW: 18.4 % — ABNORMAL HIGH (ref 11.5–15.5)
WBC: 11.5 10*3/uL — ABNORMAL HIGH (ref 4.0–10.5)
nRBC: 0 % (ref 0.0–0.2)

## 2022-02-16 LAB — FERRITIN: Ferritin: 92 ng/mL (ref 24–336)

## 2022-02-16 LAB — C-REACTIVE PROTEIN: CRP: 8.7 mg/dL — ABNORMAL HIGH (ref <1.0)

## 2022-02-16 MED ORDER — METOPROLOL TARTRATE 50 MG PO TABS: 50.0000 mg | ORAL_TABLET | Freq: Two times a day (BID) | ORAL | Status: DC

## 2022-02-16 MED ORDER — SODIUM CHLORIDE 0.9 % IV SOLN
INTRAVENOUS | Status: DC
Start: 2022-02-16 — End: 2022-02-17

## 2022-02-16 MED ORDER — HYDRALAZINE HCL 20 MG/ML IJ SOLN
20.0000 mg | Freq: Three times a day (TID) | INTRAMUSCULAR | Status: DC
  Administered 2022-02-16 – 2022-02-18 (×7): 20 mg via INTRAVENOUS
  Filled 2022-02-16 (×6): qty 1

## 2022-02-16 NOTE — Progress Notes (Signed)
New bottle of cleviprex scanned in channel not communicating with computer. RN manually input med, will change channel to avoid this in the future  ?

## 2022-02-16 NOTE — Consult Note (Signed)
Reason for Consult: Acute kidney injury  ?Referring Physician: Karie Fetch, MD (CCM) ? ?HPI:  ?36 year old man admitted to [facility] with hypertensive emergency (201/138) complicated by left sided intracerebral hemorrhage and associated edema/midline shift.  He originally presented with right-sided weakness to [facility] where he was diagnosed with this and transferred here.  He was noted to be hypoxic prior to arrival to this hospital and is suspected to have had an aspiration event from emesis but was also diagnosed with COVID-19 infection.  He has had a tenuous respiratory status and is on BiPAP. ? ?From review of available labs, creatinine last year (2022) was normal at 1.1, his wife believes that 2 weeks ago labs showed some renal dysfunction (does not recall values or have access to these currently).  On admission (01/27/2022) creatinine was 2.6.  He got a CT scan of the head with intravenous contrast on 4/29.  Creatinine rose to 2.9 on 4/30, 3.4 on 5/1 and today is 3.6 along with medically induced hypernatremia to limit cerebral edema injury. ? ?His urine output overnight was 1.9 L.  Review of records since admission shows some fluctuating blood pressures without obvious hypotension.  Renal ultrasound from 4/29 shows medical renal disease changes of both kidneys (increased cortical echogenicity with normal renal volume) without any hydronephrosis. ? ?Past Medical History:  ?Diagnosis Date  ? Anger   ? Anxiety   ? Depression   ? Hiatal hernia   ? Hypertension   ? Mood changes   ? Obsessive compulsive disorder   ? ? ?No past surgical history on file. ? ?Family History  ?Problem Relation Age of Onset  ? OCD Mother   ? Alcohol abuse Father   ? Bipolar disorder Father   ? Drug abuse Father   ? Alcohol abuse Maternal Uncle   ? Alcohol abuse Paternal Uncle   ? OCD Maternal Grandfather   ? Alcohol abuse Paternal Grandmother   ? Drug abuse Paternal Grandmother   ? ? ?Social History:  reports that he has quit  smoking. His smoking use included cigarettes. He smoked an average of 1 pack per day. He has quit using smokeless tobacco.  His smokeless tobacco use included chew. He reports that he does not drink alcohol and does not use drugs. ? ?Allergies: No Known Allergies ? ?Medications: I have reviewed the patient's current medications. ?Scheduled: ? amLODipine  10 mg Oral Daily  ? ARIPiprazole  10 mg Oral Daily  ? vitamin C  500 mg Oral Daily  ? Chlorhexidine Gluconate Cloth  6 each Topical Q0600  ? citalopram  40 mg Oral Daily  ? dexamethasone (DECADRON) injection  6 mg Intravenous Q24H  ? hydrALAZINE  20 mg Intravenous Q8H  ? insulin aspart  0-15 Units Subcutaneous Q4H  ? ipratropium-albuterol  3 mL Nebulization Q6H  ? liothyronine  5 mcg Oral QAC breakfast  ? nicotine  21 mg Transdermal Daily  ? zinc sulfate  220 mg Oral Daily  ? ? ? ?  Latest Ref Rng & Units 02/16/2022  ?  9:39 AM 02/16/2022  ?  3:31 AM 02/15/2022  ? 10:08 PM  ?BMP  ?Glucose 70 - 99 mg/dL  767     ?BUN 6 - 20 mg/dL  34     ?Creatinine 0.61 - 1.24 mg/dL  2.09     ?Sodium 135 - 145 mmol/L 159   154   155    ?Potassium 3.5 - 5.1 mmol/L  4.1     ?  Chloride 98 - 111 mmol/L  126     ?CO2 22 - 32 mmol/L  19     ?Calcium 8.9 - 10.3 mg/dL  8.6     ? ? ?  Latest Ref Rng & Units 02/16/2022  ?  3:31 AM 02/15/2022  ?  4:52 AM 02/14/2022  ?  3:52 AM  ?CBC  ?WBC 4.0 - 10.5 K/uL 11.5   12.9   12.4    ?Hemoglobin 13.0 - 17.0 g/dL 26.3   33.5   45.6    ?Hematocrit 39.0 - 52.0 % 37.3   36.4   38.5    ?Platelets 150 - 400 K/uL 239   232   249    ? ?Urinalysis ?   ?Component Value Date/Time  ? COLORURINE YELLOW 02/11/2022 1601  ? APPEARANCEUR CLEAR 02/05/2022 1601  ? LABSPEC 1.022 01/27/2022 1601  ? PHURINE 5.0 01/21/2022 1601  ? GLUCOSEU NEGATIVE 01/23/2022 1601  ? HGBUR SMALL (A) 02/11/2022 1601  ? BILIRUBINUR NEGATIVE 01/28/2022 1601  ? KETONESUR NEGATIVE 02/11/2022 1601  ? PROTEINUR >=300 (A) 01/23/2022 1601  ? NITRITE NEGATIVE 02/12/2022 1601  ? LEUKOCYTESUR NEGATIVE 01/31/2022  1601  ? ? ? ? ?CT HEAD WO CONTRAST ( ) ? ?Result Date: 02/15/2022 ?CLINICAL DATA:  Hemorrhagic stroke EXAM: CT HEAD WITHOUT CONTRAST TECHNIQUE: Contiguous axial images were obtained from the base of the skull through the vertex without intravenous contrast. RADIATION DOSE REDUCTION: This exam was performed according to the departmental dose-optimization program which includes automated exposure control, adjustment of the mA and/or kV according to patient size and/or use of iterative reconstruction technique. COMPARISON:  02/14/2022 FINDINGS: Brain: Unchanged size of intraparenchymal hematoma centered in the left basal ganglia. Unchanged mass effect on the left lateral ventricle with 5 mm of rightward midline shift. No new site of hemorrhage. No hydrocephalus. Vascular: No abnormal hyperdensity of the major intracranial arteries or dural venous sinuses. No intracranial atherosclerosis. Skull: The visualized skull base, calvarium and extracranial soft tissues are normal. Sinuses/Orbits: No fluid levels or advanced mucosal thickening of the visualized paranasal sinuses. No mastoid or middle ear effusion. The orbits are normal. IMPRESSION: Unchanged size of intraparenchymal hematoma centered in the left basal ganglia with 5 mm of rightward midline shift. Electronically Signed   By: Deatra Robinson M.D.   On: 02/15/2022 23:12  ? ?DG CHEST PORT 1 VIEW ? ?Result Date: 02/15/2022 ?CLINICAL DATA:  COVID positive with high O2 requirements. EXAM: PORTABLE CHEST 1 VIEW COMPARISON:  01/28/2022 FINDINGS: The heart is enlarged but stable. Worsening lung aeration with patchy bilateral infiltrates. No pleural effusions or pneumothorax. IMPRESSION: Worsening lung aeration with patchy bilateral infiltrates. Electronically Signed   By: Rudie Meyer M.D.   On: 02/15/2022 13:21  ? ?ECHOCARDIOGRAM COMPLETE ? ?Result Date: 02/14/2022 ?   ECHOCARDIOGRAM REPORT   Patient Name:   Clinton Taylor Date of Exam: 02/14/2022 Medical Rec #:  [id number]         Height:       78.0 in Accession #:    [id number]       Weight:       388.0 lb Date of Birth:  05/20/1986       BSA:          2.992 m? Patient Age:    35 years         BP:           141/83 mmHg Patient Gender: M  HR:           77 bpm. Exam Location:  Inpatient Procedure: 2D Echo, Cardiac Doppler, Color Doppler and Intracardiac            Opacification Agent Indications:    CVA  History:        Patient has no prior history of Echocardiogram examinations.                 Risk Factors:Hypertension.  Sonographer:    Neomia DearAMARA CROWN RDCS Referring Phys: [id number] Gordy CouncilmanSRISHTI L BHAGAT  Sonographer Comments: Technically difficult study due to poor echo windows, patient is morbidly obese and echo performed with patient supine and on artificial respirator. Image acquisition challenging due to patient body habitus. IMPRESSIONS  1. Left ventricular ejection fraction, by estimation, is 60 to 65%. The left ventricle has normal function. The left ventricle has no regional wall motion abnormalities. There is moderate left ventricular hypertrophy. Left ventricular diastolic parameters are indeterminate. Elevated left atrial pressure.  2. RV not well visualized, grossly appears normal in size and function. . Right ventricular systolic function was not well visualized. The right ventricular size is not well visualized. Tricuspid regurgitation signal is inadequate for assessing PA pressure.  3. The mitral valve was not well visualized. No evidence of mitral valve regurgitation. No evidence of mitral stenosis.  4. The aortic valve was not well visualized. Aortic valve regurgitation is not visualized. No aortic stenosis is present. FINDINGS  Left Ventricle: Left ventricular ejection fraction, by estimation, is 60 to 65%. The left ventricle has normal function. The left ventricle has no regional wall motion abnormalities. The left ventricular internal cavity size was normal in size. There is  moderate left ventricular  hypertrophy. Left ventricular diastolic parameters are indeterminate. Elevated left atrial pressure. Right Ventricle: RV not well visualized, grossly appears normal in size and function. The right ventricular

## 2022-02-16 NOTE — Progress Notes (Signed)
Inpatient Rehab Admissions Coordinator:  ? ?Per therapy recommendations, patient was screened for CIR candidacy by Clemens Catholic, MS, CCC-SLP. At this time, Pt. is not tolerating OOB and I do not think he's at a level to tolerate the intensity of CIR.  Pt. may have potential to progress to becoming a potential CIR candidate, so CIR admissions team will follow and monitor for progress and participation with therapies and place consult order if Pt. appears to be an appropriate candidate. Please contact me with any questions.  ? ? ?Clemens Catholic, MS, CCC-SLP ?Rehab Admissions Coordinator  ?[phone] (celll) ?[phone] (office) ? ?
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
## 2022-02-16 NOTE — Progress Notes (Signed)
STROKE TEAM PROGRESS NOTE  ? ?INTERVAL HISTORY ?Wife at the bedside. Pt still on BiPAP, more awake alert and high five with me using his left hand.  Still has right hemiplegia.  CCM concerning for his respiratory, high risk for intubation.  PT OT recommend CIR. Na 159, change 3% saline to normal saline. ? ?Vitals:  ? 02/16/22 1506 02/16/22 1508 02/16/22 1515 02/16/22 1530  ?BP:   (!) 183/98 (!) 159/90  ?Pulse: 97  98 (!) 101  ?Resp: (!) 26  20 (!) 27  ?Temp:      ?TempSrc:      ?SpO2: 95% 94% 90% 92%  ?Weight:      ?Height:      ? ?CBC:  ?Recent Labs  ?Lab 02/15/22 ?0452 02/16/22 ?0331  ?WBC 12.9* 11.5*  ?NEUTROABS 11.0* 10.0*  ?HGB 11.4* 11.6*  ?HCT 36.4* 37.3*  ?MCV 80.5 81.3  ?PLT 232 239  ? ?Basic Metabolic Panel:  ?Recent Labs  ?Lab 01/21/2022 ?1751 01/25/2022 ?2042 02/15/22 ?6222 02/15/22 ?9798 02/16/22 ?0331 02/16/22 ?9211  ?NA 138   < > 148*   < > 154* 159*  ?K  --    < > 3.9  --  4.1  --   ?CL  --    < > 120*  --  126*  --   ?CO2  --    < > 21*  --  19*  --   ?GLUCOSE  --    < > 113*  --  106*  --   ?BUN  --    < > 28*  --  34*  --   ?CREATININE  --    < > 3.37*  --  3.56*  --   ?CALCIUM  --    < > 8.6*  --  8.6*  --   ?MG 2.4  --  2.3  --   --   --   ? < > = values in this interval not displayed.  ? ?Lipid Panel:  ?Recent Labs  ?Lab 02/14/22 ?0352 02/15/22 ?0452  ?CHOL 192  --   ?TRIG 309* 270*  ?HDL 20*  --   ?CHOLHDL 9.6  --   ?VLDL 62*  --   ?LDLCALC 110*  --   ? ?HgbA1c:  ?Recent Labs  ?Lab 01/31/2022 ?1751  ?HGBA1C 5.5  ? ?Urine Drug Screen:  ?Recent Labs  ?Lab 02/06/2022 ?1601  ?LABOPIA NONE DETECTED  ?COCAINSCRNUR NONE DETECTED  ?LABBENZ NONE DETECTED  ?AMPHETMU NONE DETECTED  ?THCU NONE DETECTED  ?LABBARB NONE DETECTED  ?  ?Alcohol Level  ?Recent Labs  ?Lab 02/01/2022 ?1242  ?ETH <10  ? ? ?IMAGING past 24 hours ?CT HEAD WO CONTRAST ( ) ? ?Result Date: 02/15/2022 ?CLINICAL DATA:  Hemorrhagic stroke EXAM: CT HEAD WITHOUT CONTRAST TECHNIQUE: Contiguous axial images were obtained from the base of the skull  through the vertex without intravenous contrast. RADIATION DOSE REDUCTION: This exam was performed according to the departmental dose-optimization program which includes automated exposure control, adjustment of the mA and/or kV according to patient size and/or use of iterative reconstruction technique. COMPARISON:  02/14/2022 FINDINGS: Brain: Unchanged size of intraparenchymal hematoma centered in the left basal ganglia. Unchanged mass effect on the left lateral ventricle with 5 mm of rightward midline shift. No new site of hemorrhage. No hydrocephalus. Vascular: No abnormal hyperdensity of the major intracranial arteries or dural venous sinuses. No intracranial atherosclerosis. Skull: The visualized skull base, calvarium and extracranial soft tissues are normal. Sinuses/Orbits: No fluid levels or  advanced mucosal thickening of the visualized paranasal sinuses. No mastoid or middle ear effusion. The orbits are normal. IMPRESSION: Unchanged size of intraparenchymal hematoma centered in the left basal ganglia with 5 mm of rightward midline shift. Electronically Signed   By: Deatra Robinson M.D.   On: 02/15/2022 23:12   ? ?PHYSICAL EXAM ?General:  Drowsy, morbid obese, well-developed patient on BiPAP ?Respiratory: Respirations somewhat labored on BiPAP and supplemental O2 ? ?NEURO:  ?Patient awake alert, open eyes spontaneously, not easily to talk with BiPAP, but follows all simple commands.  Pupil equal reactive, right facial droop, visual field not cooperative, but tracking bilaterally.  Right upper and extremity flaccid.  Left upper and lower extremity strong and spontaneous movement.  RUE flaccid, RLE withdraw to pain 2/5. Sensation diminished on the right, FTN intact on the left. Gait not tested.  ? ? ?ASSESSMENT/PLAN ?Clinton Taylor is a 36 y.o. male with history of HTN, HLD, obesity, smoking, OSA on CPAP and mood disorder presenting with acute right hemiplegia with right sided numbness.  He was taken to  [facility], was found to have an ICH and was transferred here for further management.  ICH score is 1.  Head CT last night reveals increasing left to right midline shift to 82mm without hydrocephalus.  3% HTS is running at 75cc/hr.  Patient has confirmed covid-19 infection.  His wife reports that he smokes cigarettes and is not always compliant with his medical therapy at home. ? ?ICH:  left large ICH involving corona radiata, lentiform nucleus and subinsular white matter with 89mm left to right midline shift  ?CT head IPH involving left corona radiata, left lentiform nucleus and subinsular white matter with regional mass effect and 22mm midline shift ?CTA Head and Neck Nondiagnostic exam ?Follow up CT 4/29 2112 unchanged size of left ICH with increased edema and 56mm left to right midline shift ?CT head 4/30 unchanged hematoma size, midline shift 7 mm ?CT repeat 5/1 stable hematoma, midline shift 5 mm ?Consider MRI and MRA once stable off BiPAP ?2D Echo EF 60-65%, interatrial septum not well visualized ?LDL 110 ?HgbA1c 5.5 ?VTE prophylaxis - SCDs ?No antithrombotic prior to admission, now on No antithrombotic secondary to ICH ?Therapy recommendations:  pending ?Disposition:  pending ? ?Cerebral Edema (brain compression) ?Cerebral edema present from ICH with 65mm midline shift ?3% HTS at 75 cc/hr -> NS @ 50 ?CT Head 4/30 unchanged hematoma, midline shift 7 mm ?CT head 5/1 midline shift 50mm ?Na goal 150-155 ?Na 148->151->152->159 ?Na monitoring Q6h ? ?Hypertensive emergency ?Home meds:  losartan-HCTZ 100-12.5 daily, amlodipine 10 mg daily ?Unstable ?requiring cleviprex drip ?Keep SBP < 160 ?Add amlodipine 10 and metoprolol 50 twice daily ?Long-term BP goal normotensive ? ?AKI with ATN ?Cre 2.59->2.72->2.90->3.37->3.56 ?Avoid nephrotoxic agent ?Nephro on board ?Considering ATN ? ?Covid-19 infection  ?Patient is positive for covid-19 infection ?Mild hypoxia, will use supplemental O2 via [place] ?CCM on board ?On decadron IV ?On  BiPAP ?Per report, pt refused remedesivir and paxlovid  ?High risk for intubation ? ?Hyperlipidemia ?Home meds:  none ?LDL 110, goal < 70 ?Hold off statin now given ICH ?Add statin at discharge ? ?Dysphagia ?Patient did not pass swallow but sip with meds, on BiPAP ?Decrease IV fluid ? ?Tobacco abuse ?Current smoker ?Smoking cessation counseling provided ?Nicotine patch provided ?Pt is willing to quit ? ?Other Stroke Risk Factors ?Obesity, Body mass index is 43.92 kg/m?., BMI >/= 30 associated with increased stroke risk, recommend weight loss, diet and exercise as  appropriate  ?Obstructive sleep apnea, on CPAP at home ? ?Other Active Problems ?Leukocytosis WBC 15.2-12.4->12.9->11.5 ?Mood disorder ? ?Hospital day # 3 ? ?This patient is critically ill due to large left ICH, cerebral edema, respiratory distress, COVID infection and at significant risk of neurological worsening, death form brain herniation, hydrocephalus, respiratory failure. This patient's care requires constant monitoring of vital signs, hemodynamics, respiratory and cardiac monitoring, review of multiple databases, neurological assessment, discussion with family, other specialists and medical decision making of high complexity. I spent 35 minutes of neurocritical care time in the care of this patient.  I discussed with Dr. Chestine Sporelark. I had long discussion with wife at bedside, updated pt current condition, treatment plan and potential prognosis, and answered all the questions.  She expressed understanding and appreciation.  ? ? ?Marvel PlanJindong Nixie Laube, MD PhD ?Stroke Neurology ?02/16/2022 ?3:52 PM ? ? ?To contact Stroke Continuity provider, please refer to [email or web address]. ?After hours, contact General Neurology  ?

## 2022-02-16 NOTE — Progress Notes (Signed)
SLP Cancellation Note ? ?Patient Details ?Name: Clinton Taylor ?MRN: [id number] ?DOB: 03-27-1986 ? ? ?Cancelled treatment:       Reason Eval/Treat Not Completed: Medical issues which prohibited therapy. RN reports pt is not able to come off BIPAP at this time. SLP will f/u tomorrow.  ? ? ?Tiffaney Heimann, Riley Nearing ?02/16/2022, 8:56 AM ?

## 2022-02-17 DIAGNOSIS — N170 Acute kidney failure with tubular necrosis: Secondary | ICD-10-CM | POA: Diagnosis not present

## 2022-02-17 DIAGNOSIS — I10 Essential (primary) hypertension: Secondary | ICD-10-CM | POA: Diagnosis not present

## 2022-02-17 DIAGNOSIS — I610 Nontraumatic intracerebral hemorrhage in hemisphere, subcortical: Secondary | ICD-10-CM | POA: Diagnosis not present

## 2022-02-17 DIAGNOSIS — U071 COVID-19: Secondary | ICD-10-CM | POA: Diagnosis not present

## 2022-02-17 LAB — COMPREHENSIVE METABOLIC PANEL
ALT: 47 U/L — ABNORMAL HIGH (ref 0–44)
AST: 65 U/L — ABNORMAL HIGH (ref 15–41)
Albumin: 3.1 g/dL — ABNORMAL LOW (ref 3.5–5.0)
Alkaline Phosphatase: 55 U/L (ref 38–126)
BUN: 37 mg/dL — ABNORMAL HIGH (ref 6–20)
CO2: 18 mmol/L — ABNORMAL LOW (ref 22–32)
Calcium: 8.9 mg/dL (ref 8.9–10.3)
Chloride: >130 mmol/L (ref 98–111)
Creatinine, Ser: 3.72 mg/dL — ABNORMAL HIGH (ref 0.61–1.24)
GFR, Estimated: 21 mL/min — ABNORMAL LOW (ref >60)
Glucose, Bld: 99 mg/dL (ref 70–99)
Potassium: 3.9 mmol/L (ref 3.5–5.1)
Sodium: 161 mmol/L (ref 135–145)
Total Bilirubin: 0.4 mg/dL (ref 0.3–1.2)
Total Protein: 6.7 g/dL (ref 6.5–8.1)

## 2022-02-17 LAB — GLUCOSE, CAPILLARY
Glucose-Capillary: 103 mg/dL — ABNORMAL HIGH (ref 70–99)
Glucose-Capillary: 103 mg/dL — ABNORMAL HIGH (ref 70–99)
Glucose-Capillary: 108 mg/dL — ABNORMAL HIGH (ref 70–99)
Glucose-Capillary: 146 mg/dL — ABNORMAL HIGH (ref 70–99)
Glucose-Capillary: 153 mg/dL — ABNORMAL HIGH (ref 70–99)
Glucose-Capillary: 155 mg/dL — ABNORMAL HIGH (ref 70–99)

## 2022-02-17 LAB — CBC WITH DIFFERENTIAL/PLATELET
Abs Immature Granulocytes: 0.15 10*3/uL — ABNORMAL HIGH (ref 0.00–0.07)
Basophils Absolute: 0.1 10*3/uL (ref 0.0–0.1)
Basophils Relative: 1 %
Eosinophils Absolute: 0 10*3/uL (ref 0.0–0.5)
Eosinophils Relative: 0 %
HCT: 37 % — ABNORMAL LOW (ref 39.0–52.0)
Hemoglobin: 11.5 g/dL — ABNORMAL LOW (ref 13.0–17.0)
Immature Granulocytes: 1 %
Lymphocytes Relative: 13 %
Lymphs Abs: 1.4 10*3/uL (ref 0.7–4.0)
MCH: 25.3 pg — ABNORMAL LOW (ref 26.0–34.0)
MCHC: 31.1 g/dL (ref 30.0–36.0)
MCV: 81.3 fL (ref 80.0–100.0)
Monocytes Absolute: 0.7 10*3/uL (ref 0.1–1.0)
Monocytes Relative: 7 %
Neutro Abs: 8.9 10*3/uL — ABNORMAL HIGH (ref 1.7–7.7)
Neutrophils Relative %: 78 %
Platelets: 256 10*3/uL (ref 150–400)
RBC: 4.55 MIL/uL (ref 4.22–5.81)
RDW: 19.1 % — ABNORMAL HIGH (ref 11.5–15.5)
WBC: 11.3 10*3/uL — ABNORMAL HIGH (ref 4.0–10.5)
nRBC: 0 % (ref 0.0–0.2)

## 2022-02-17 LAB — FERRITIN: Ferritin: 100 ng/mL (ref 24–336)

## 2022-02-17 LAB — D-DIMER, QUANTITATIVE: D-Dimer, Quant: 1.27 ug/mL-FEU — ABNORMAL HIGH (ref 0.00–0.50)

## 2022-02-17 LAB — SODIUM
Sodium: 161 mmol/L (ref 135–145)
Sodium: 162 mmol/L (ref 135–145)
Sodium: 161 mmol/L (ref 135–145)
Sodium: 161 mmol/L (ref 135–145)

## 2022-02-17 LAB — C-REACTIVE PROTEIN: CRP: 8.8 mg/dL — ABNORMAL HIGH (ref <1.0)

## 2022-02-17 LAB — TRIGLYCERIDES: Triglycerides: 354 mg/dL — ABNORMAL HIGH (ref <150)

## 2022-02-17 MED ORDER — IPRATROPIUM-ALBUTEROL 0.5-2.5 (3) MG/3ML IN SOLN
3.0000 mL | Freq: Three times a day (TID) | RESPIRATORY_TRACT | Status: DC
  Administered 2022-02-17 – 2022-02-26 (×28): 3 mL via RESPIRATORY_TRACT
  Filled 2022-02-17 (×28): qty 3

## 2022-02-17 MED ORDER — SODIUM CHLORIDE 0.9 % IV SOLN: INTRAVENOUS | Status: DC | PRN

## 2022-02-17 MED ORDER — SODIUM CHLORIDE 0.9 % IV SOLN: INTRAVENOUS | Status: DC

## 2022-02-17 MED ORDER — DILTIAZEM HCL-DEXTROSE 125-5 MG/125ML-% IV SOLN (PREMIX)
5.0000 mg/h | INTRAVENOUS | Status: DC
  Administered 2022-02-17: 5 mg/h via INTRAVENOUS
  Administered 2022-02-17 – 2022-02-19 (×5): 15 mg/h via INTRAVENOUS
  Filled 2022-02-17 (×7): qty 125

## 2022-02-17 MED ORDER — ONDANSETRON HCL 4 MG/2ML IJ SOLN
4.0000 mg | Freq: Once | INTRAMUSCULAR | Status: DC
  Filled 2022-02-17: qty 2

## 2022-02-17 NOTE — Progress Notes (Signed)
eLink Physician-Brief Progress Note ?Patient Name: Shoaib Siefker ?DOB: [birth year] ?MRN: [id number] ? ? ?Date of Service ? 02/17/2022  ?HPI/Events of Note ? Patient is nauseated, Qtc 488.  ?eICU Interventions ? Zofran ordered.  ? ? ? ?  ? ?Thomasene Lot Iyonnah Ferrante ?02/17/2022, 1:09 AM ?
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
## 2022-02-17 NOTE — Progress Notes (Signed)
Paged Dr. Otelia Limes critical result: sodium 161. No new orders at this time. Will continue to monitor.  ? ?Octavia Bruckner RN ?

## 2022-02-17 NOTE — TOC CAGE-AID Note (Signed)
Transition of Care (TOC) - CAGE-AID Screening ? ? ?Patient Details  ?Name: Clinton Taylor ?MRN: [id number] ?Date of Birth: January 31, 1986 ? ?Transition of Care (TOC) CM/SW Contact:    ?Danyael Alipio C Tarpley-Carter, LCSWA ?Phone Number: ?02/17/2022, 2:30 PM ? ? ?Clinical Narrative: ?Pt participated in [place].  Pt stated he does not use substance or ETOH.  Pt was not offered resources, due to no usage of substance or ETOH.   ? ?Passenger transport manager, MSW, LCSW-A ?Pronouns:  She/Her/Hers ?[facility] ?Clinical Social Worker ?Direct Number:  [phone] ?[email or web address] ? ?CAGE-AID Screening: ?  ? ?Have You Ever Felt You Ought to Cut Down on Your Drinking or Drug Use?: No ?Have People Annoyed You By Critizing Your Drinking Or Drug Use?: No ?Have You Felt Bad Or Guilty About Your Drinking Or Drug Use?: No ?Have You Ever Had a Drink or Used Drugs First Thing In The Morning to Steady Your Nerves or to Get Rid of a Hangover?: No ?CAGE-AID Score: 0 ? ?Substance Abuse Education Offered: No ? ?  ? ? ? ? ? ? ?
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
## 2022-02-17 NOTE — Progress Notes (Signed)
Continues to have elevated Na, currently at 161.  ? ?Increasing NS infusion rate to 75 cc/hr from 10 cc/hr for the next 24 hours.   ? ?Electronically signed: Dr. Caryl Pina ? ?

## 2022-02-17 NOTE — Progress Notes (Signed)
Patient ID: Langley GaussBryan Delahoz, male   DOB: 20-Sep-1986, 36 y.o.   MRN: [id number] ?[facility] ?Progress Note  ? ?Assessment/ Plan:   ?1.  Acute kidney injury on chronic kidney disease stage IIIb (labs from last month showed creatinine 2.39):  Based on the history and timeline of events, this appears to be hemodynamically mediated with recent contrast exposure and fluctuations in blood pressure likely resulting in ATN.  Excellent urine output overnight with relatively unchanged renal function, awaiting urine studies for additional evaluation especially with dipstick proteinuria. ?2.  Hypernatremia: Medically induced with hypertonic saline to mitigate risk from cerebral edema.  He was transitioned from hypertonic saline to normal saline overnight with most recent sodium level of 161. ?3.  Hypertensive emergency with acute left-sided intracerebral hemorrhage: With right-sided hemiparesis at this time with ongoing efforts at blood pressure control at this time. ?4.  Acute hypoxic respiratory failure: On noninvasive positive pressure ventilation along with Decadron treatment for COVID-19 infection and community-acquired pneumonia on ceftriaxone and azithromycin. ?5.  Metabolic acidosis: Mild and likely associated with acute kidney injury, will continue to follow labs. ? ?Subjective:   ?Without acute events overnight, transitioned from hypertonic saline to normal saline overnight after increasing hyponatremia.  ? ?Objective:   ?BP (!) 174/96   Pulse 98   Temp 99.4 ?F (37.4 ?C) (Axillary)   Resp (!) 27   Ht 6\' 6"  (1.981 m)   Wt (!) 176.1 kg   SpO2 (!) 87%   BMI 44.86 kg/m?  ? ?Intake/Output Summary (Last 24 hours) at 02/17/2022 0918 ?Last data filed at 02/17/2022 0800 ?Gross per 24 hour  ?Intake 3095.22 ml  ?Output 3375 ml  ?Net -279.78 ml  ? ?Weight change: 3.7 kg ? ?Physical Exam: ?Gen: Comfortably resting in bed, wife at bedside. ?CVS: Pulse regular rhythm, normal rate, S1 and S2 normal ?Resp: Clear to  auscultation bilaterally, no distinct rales or rhonchi.  On high flow oxygen via nasal cannula ?Abd: Soft, obese, nontender, bowel sounds normal ?Ext: 1+ bilateral lower extremity edema, right-sided hemiparesis ? ?Imaging: ?CT HEAD WO CONTRAST (5MM) ? ?Result Date: 02/15/2022 ?CLINICAL DATA:  Hemorrhagic stroke EXAM: CT HEAD WITHOUT CONTRAST TECHNIQUE: Contiguous axial images were obtained from the base of the skull through the vertex without intravenous contrast. RADIATION DOSE REDUCTION: This exam was performed according to the departmental dose-optimization program which includes automated exposure control, adjustment of the mA and/or kV according to patient size and/or use of iterative reconstruction technique. COMPARISON:  02/14/2022 FINDINGS: Brain: Unchanged size of intraparenchymal hematoma centered in the left basal ganglia. Unchanged mass effect on the left lateral ventricle with 5 mm of rightward midline shift. No new site of hemorrhage. No hydrocephalus. Vascular: No abnormal hyperdensity of the major intracranial arteries or dural venous sinuses. No intracranial atherosclerosis. Skull: The visualized skull base, calvarium and extracranial soft tissues are normal. Sinuses/Orbits: No fluid levels or advanced mucosal thickening of the visualized paranasal sinuses. No mastoid or middle ear effusion. The orbits are normal. IMPRESSION: Unchanged size of intraparenchymal hematoma centered in the left basal ganglia with 5 mm of rightward midline shift. Electronically Signed   By: Deatra RobinsonKevin  Herman M.D.   On: 02/15/2022 23:12  ? ?DG CHEST PORT 1 VIEW ? ?Result Date: 02/15/2022 ?CLINICAL DATA:  COVID positive with high O2 requirements. EXAM: PORTABLE CHEST 1 VIEW COMPARISON:  02/08/2022 FINDINGS: The heart is enlarged but stable. Worsening lung aeration with patchy bilateral infiltrates. No pleural effusions or pneumothorax. IMPRESSION: Worsening lung aeration with patchy bilateral  infiltrates. Electronically Signed    By: Marijo Sanes M.D.   On: 02/15/2022 13:21   ? ?Labs: ?BMET ?Recent Labs  ?Lab 01/28/2022 ?1242 01/19/2022 ?1751 01/29/2022 ?2042 01/29/2022 ?2226 02/14/22 ?[id number] 02/14/22 ?[id number] 02/15/22 ?[id number] 02/15/22 ?[id number] 02/15/22 ?1626 02/15/22 ?2208 02/16/22 ?[id number] 02/16/22 ?[id number] 02/16/22 ?1618 02/16/22 ?2311 02/17/22 ?0433  ?NA 138   < > 137   < > 138   < > 148*   < > 152* 155* 154* 159* 160* 161* 161*  ?K 2.9*  --  3.4*  --  3.1*  --  3.9  --   --   --  4.1  --   --   --  3.9  ?CL 103  --  102  --  108  --  120*  --   --   --  126*  --   --   --  >130*  ?CO2 23  --  25  --  23  --  21*  --   --   --  19*  --   --   --  18*  ?GLUCOSE 125*  --  119*  --  134*  --  113*  --   --   --  106*  --   --   --  99  ?BUN 29*  --  25*  --  26*  --  28*  --   --   --  34*  --   --   --  37*  ?CREATININE 2.59*  --  2.72*  --  2.90*  --  3.37*  --   --   --  3.56*  --   --   --  3.72*  ?CALCIUM 8.9  --  8.8*  --  8.4*  --  8.6*  --   --   --  8.6*  --   --   --  8.9  ? < > = values in this interval not displayed.  ? ?CBC ?Recent Labs  ?Lab 02/14/22 ?[id number] 02/15/22 ?[id number] 02/16/22 ?0331 02/17/22 ?[id number]  ?WBC 12.4* 12.9* 11.5* 11.3*  ?NEUTROABS 10.9* 11.0* 10.0* 8.9*  ?HGB 13.0 11.4* 11.6* 11.5*  ?HCT 38.5* 36.4* 37.3* 37.0*  ?MCV 76.7* 80.5 81.3 81.3  ?PLT 249 232 239 256  ? ? ?Medications:   ? ? amLODipine  10 mg Oral Daily  ? ARIPiprazole  10 mg Oral Daily  ? vitamin C  500 mg Oral Daily  ? Chlorhexidine Gluconate Cloth  6 each Topical Q0600  ? citalopram  40 mg Oral Daily  ? dexamethasone (DECADRON) injection  6 mg Intravenous Q24H  ? hydrALAZINE  20 mg Intravenous Q8H  ? insulin aspart  0-15 Units Subcutaneous Q4H  ? ipratropium-albuterol  3 mL Nebulization Q6H  ? liothyronine  5 mcg Oral QAC breakfast  ? nicotine  21 mg Transdermal Daily  ? ondansetron (ZOFRAN) IV  4 mg Intravenous Once  ? zinc sulfate  220 mg Oral Daily  ? ?Elmarie Shiley, MD ?02/17/2022, 9:18 AM  ? ?

## 2022-02-17 NOTE — Plan of Care (Signed)
Patient able to be off BiPap intermittently throughout day.  Also dangle at bedside with PT.  Speech advanced diet and is tolerating well but need continual reinforcement to eat and drink slowly with intention. Remains very impulsive.    ?

## 2022-02-17 NOTE — Progress Notes (Signed)
Date and time results received: 02/17/22 2300 ?(use smartphrase ".now" to insert current time) ? ?Test: Sodium ?Critical Value: 161 ? ?Name of Provider Notified: EOtelia Limes ? ?Orders Received? Or Actions Taken?:  No new orders at this time ?

## 2022-02-17 NOTE — Progress Notes (Addendum)
STROKE TEAM PROGRESS NOTE  ? ?INTERVAL HISTORY ?Wife at the bedside. Patient is no on nasal cannula and is more alert, oriented x3 and able to follow commands.  He continues to require Cleviprex for BP control. ? ?Vitals:  ? 02/17/22 1145 02/17/22 1200 02/17/22 1210 02/17/22 1233  ?BP: (!) 169/91 (!) 170/92    ?Pulse: 96 95  91  ?Resp: 20 (!) 22  (!) 22  ?Temp:      ?TempSrc:      ?SpO2: 93% 93%  95%  ?Weight:      ?Height:   5\' 9"  (1.753 m)   ? ?CBC:  ?Recent Labs  ?Lab 02/16/22 ?0331 02/17/22 ?0433  ?WBC 11.5* 11.3*  ?NEUTROABS 10.0* 8.9*  ?HGB 11.6* 11.5*  ?HCT 37.3* 37.0*  ?MCV 81.3 81.3  ?PLT 239 256  ? ? ?Basic Metabolic Panel:  ?Recent Labs  ?Lab 02/03/2022 ?1751 01/25/2022 ?2042 02/15/22 ?[id number] 02/15/22 ?[id number] 02/16/22 ?0331 02/16/22 ?[id number] 02/17/22 ?[id number] 02/17/22 ?1156  ?NA 138   < > 148*   < > 154*   < > 161* 162*  ?K  --    < > 3.9  --  4.1  --  3.9  --   ?CL  --    < > 120*  --  126*  --  >130*  --   ?CO2  --    < > 21*  --  19*  --  18*  --   ?GLUCOSE  --    < > 113*  --  106*  --  99  --   ?BUN  --    < > 28*  --  34*  --  37*  --   ?CREATININE  --    < > 3.37*  --  3.56*  --  3.72*  --   ?CALCIUM  --    < > 8.6*  --  8.6*  --  8.9  --   ?MG 2.4  --  2.3  --   --   --   --   --   ? < > = values in this interval not displayed.  ? ? ?Lipid Panel:  ?Recent Labs  ?Lab 02/14/22 ?0352 02/15/22 ?0452 02/17/22 ?0433  ?CHOL 192  --   --   ?TRIG 309*   < > 354*  ?HDL 20*  --   --   ?CHOLHDL 9.6  --   --   ?VLDL 62*  --   --   ?LDLCALC 110*  --   --   ? < > = values in this interval not displayed.  ? ? ?HgbA1c:  ?Recent Labs  ?Lab 02/01/2022 ?1751  ?HGBA1C 5.5  ? ? ?Urine Drug Screen:  ?Recent Labs  ?Lab 01/23/2022 ?1601  ?LABOPIA NONE DETECTED  ?COCAINSCRNUR NONE DETECTED  ?LABBENZ NONE DETECTED  ?AMPHETMU NONE DETECTED  ?THCU NONE DETECTED  ?LABBARB NONE DETECTED  ? ?  ?Alcohol Level  ?Recent Labs  ?Lab 02/10/2022 ?1242  ?ETH <10  ? ? ? ?IMAGING past 24 hours ?No results found. ? ?PHYSICAL EXAM ?General:  Drowsy, morbid  obese, well-developed patient  ?Respiratory: Respirations somewhat labored on supplemental O2 ? ? ? ?NEURO:  ?Mental Status: AA&Ox3  ?Speech/Language: speech is with some dysarthria  Fluency, and comprehension intact. ? ?Cranial Nerves:  ?II: PERRL.  ?III, IV, VI: EOMI. Eyelids elevate symmetrically.  ?V: Sensation is intact to light touch and symmetrical to face.  ?VII: Smile is symmetrical.  ?VIII: hearing intact to  voice. ?XII: tongue is midline without fasciculations. ?Motor: 5/5 strength to LUE and LLE, 0/5 to RUE and RLE ?Tone: is normal and bulk is normal ?Sensation- Intact to light touch bilaterally.  ?Coordination: FTN intact on left .No drift on left.  ?Gait- deferred ? ? ? ?ASSESSMENT/PLAN ?Mr. Marcques Wrightsman is a 36 y.o. male with history of HTN, HLD, obesity, smoking, OSA on CPAP and mood disorder presenting with acute right hemiplegia with right sided numbness.  He was taken to [facility], was found to have an ICH and was transferred here for further management.  ICH score is 1.  Head CT last night reveals increasing left to right midline shift to 69mm without hydrocephalus.  3% HTS is running at 75cc/hr.  Patient has confirmed covid-19 infection.  His wife reports that he smokes cigarettes and is not always compliant with his medical therapy at home. ? ?ICH:  left large ICH involving corona radiata, lentiform nucleus and subinsular white matter with 100mm left to right midline shift  ?CT head IPH involving left corona radiata, left lentiform nucleus and subinsular white matter with regional mass effect and 49mm midline shift ?CTA Head and Neck Nondiagnostic exam ?Follow up CT 4/29 2112 unchanged size of left ICH with increased edema and 72mm left to right midline shift ?CT head 4/30 unchanged hematoma size, midline shift 7 mm ?CT repeat 5/1 stable hematoma, midline shift 5 mm ?Consider MRI and MRA once stable off BiPAP ?2D Echo EF 60-65%, interatrial septum not well visualized ?LDL 110 ?HgbA1c 5.5 ?VTE  prophylaxis - SCDs ?No antithrombotic prior to admission, now on No antithrombotic secondary to ICH ?Therapy recommendations:  CIR ?Disposition:  pending ? ?Cerebral Edema (brain compression) ?Cerebral edema present from ICH with 48mm midline shift ?3% HTS at 75 cc/hr -> NS @ 50-> off ?CT Head 4/30 unchanged hematoma, midline shift 7 mm ?CT head 5/1 midline shift 41mm ?Na goal 150-155 ?Na 148->151->152->159-> 162 ?Na monitoring Q6h ? ?Hypertensive emergency ?Home meds:  losartan-HCTZ 100-12.5 daily, amlodipine 10 mg daily ?Unstable ?On cleviprex drip ?Also on cardizem drip ?Keep SBP < 160 ?On amlodipine 10 and hydralazine IV Q8h ?Long-term BP goal normotensive ? ?AKI with ATN ?Cre 2.59->2.72->2.90->3.37->3.56-> 3.72 ?Avoid nephrotoxic agent ?Nephro on board, appreciate recommendations ?Considering ATN ? ?Covid-19 infection  ?Patient is positive for covid-19 infection ?Mild hypoxia, will use supplemental O2 via [place] ?CCM on board ?On decadron IV ?On BiPAP while sleeping ?Per report, pt refused remedesivir and paxlovid  ?High risk for intubation ? ?Hyperlipidemia ?Home meds:  none ?LDL 110, goal < 70 ?Hold off statin now given ICH ?Add statin at discharge ? ?Dysphagia ?Passed swallow ?Off IVF ? ?Tobacco abuse ?Current smoker ?Smoking cessation counseling provided ?Nicotine patch provided ?Pt is willing to quit ? ?Other Stroke Risk Factors ?Obesity, Body mass index is 57.33 kg/m?., BMI >/= 30 associated with increased stroke risk, recommend weight loss, diet and exercise as appropriate  ?Obstructive sleep apnea, on CPAP at home ? ?Other Active Problems ?Leukocytosis WBC 15.2-12.4->12.9->11.5-> 11.3 ?Mood disorder ? ?Hospital day # 4 ? ?Cortney E Ernestina Columbia , MSN, AGACNP-BC ?[facility] ?See Amion for schedule and pager information ?02/17/2022 12:56 PM ?  ?ATTENDING NOTE: ?I reviewed above note and agree with the assessment and plan. Pt was seen and examined.  ? ?Mom at the bedside.  Patient off BiPAP on nasal  cannula, however still has respiratory distress, will be put back on BiPAP soon.  However, awake alert, orientated x3, no aphasia, left upper  and lower extremity 5/5.  Still has right hemiplegia.  Sodium 162, off IV fluid now.  Has a swallow, on full liquid diet, continue amlodipine for BP control.  Patient still on Cleviprex, Cardizem IV and hydralazine IV for BP control.  Nephrology on board for ATN, creatinine still slow trending up.  CT repeat yesterday showed stable hematoma and decreasing midline shift.  Per CCM, patient still high risk for intubation if respiratory further decline. ? ?For detailed assessment and plan, please refer to above as I have made changes wherever appropriate.  ? ?Marvel Plan, MD PhD ?Stroke Neurology ?02/17/2022 ?6:21 PM ? ?This patient is critically ill due to large left BG ICH, cerebral edema, COVID infection, ATN, hypertensive emergency, morbid obesity and at significant risk of neurological worsening, death form brain herniation, respiratory failure, renal failure, seizure. This patient's care requires constant monitoring of vital signs, hemodynamics, respiratory and cardiac monitoring, review of multiple databases, neurological assessment, discussion with family, other specialists and medical decision making of high complexity. I spent 35 minutes of neurocritical care time in the care of this patient. I had long discussion with patient mom at bedside, updated pt current condition, treatment plan and potential prognosis, and answered all the questions.  She expressed understanding and appreciation.  I also discussed with CCM Dr. Chestine Spore. ? ? ? ? ?To contact Stroke Continuity provider, please refer to [email or web address]. ?After hours, contact General Neurology  ?
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
## 2022-02-18 ENCOUNTER — Inpatient Hospital Stay (HOSPITAL_COMMUNITY): Payer: BC Managed Care – PPO

## 2022-02-18 DIAGNOSIS — I10 Essential (primary) hypertension: Secondary | ICD-10-CM | POA: Diagnosis not present

## 2022-02-18 DIAGNOSIS — J9602 Acute respiratory failure with hypercapnia: Secondary | ICD-10-CM

## 2022-02-18 DIAGNOSIS — I610 Nontraumatic intracerebral hemorrhage in hemisphere, subcortical: Secondary | ICD-10-CM | POA: Diagnosis not present

## 2022-02-18 DIAGNOSIS — J9601 Acute respiratory failure with hypoxia: Secondary | ICD-10-CM | POA: Diagnosis not present

## 2022-02-18 DIAGNOSIS — Z9911 Dependence on respirator [ventilator] status: Secondary | ICD-10-CM

## 2022-02-18 DIAGNOSIS — J80 Acute respiratory distress syndrome: Secondary | ICD-10-CM | POA: Diagnosis not present

## 2022-02-18 DIAGNOSIS — J9690 Respiratory failure, unspecified, unspecified whether with hypoxia or hypercapnia: Secondary | ICD-10-CM

## 2022-02-18 DIAGNOSIS — U071 COVID-19: Secondary | ICD-10-CM | POA: Diagnosis not present

## 2022-02-18 DIAGNOSIS — N170 Acute kidney failure with tubular necrosis: Secondary | ICD-10-CM | POA: Diagnosis not present

## 2022-02-18 LAB — COMPREHENSIVE METABOLIC PANEL
ALT: 84 U/L — ABNORMAL HIGH (ref 0–44)
AST: 65 U/L — ABNORMAL HIGH (ref 15–41)
Albumin: 3 g/dL — ABNORMAL LOW (ref 3.5–5.0)
Alkaline Phosphatase: 61 U/L (ref 38–126)
BUN: 37 mg/dL — ABNORMAL HIGH (ref 6–20)
CO2: 18 mmol/L — ABNORMAL LOW (ref 22–32)
Calcium: 8.9 mg/dL (ref 8.9–10.3)
Chloride: >130 mmol/L (ref 98–111)
Creatinine, Ser: 3.96 mg/dL — ABNORMAL HIGH (ref 0.61–1.24)
GFR, Estimated: 19 mL/min — ABNORMAL LOW (ref >60)
Glucose, Bld: 130 mg/dL — ABNORMAL HIGH (ref 70–99)
Potassium: 4.4 mmol/L (ref 3.5–5.1)
Sodium: 160 mmol/L — ABNORMAL HIGH (ref 135–145)
Total Bilirubin: 0.8 mg/dL (ref 0.3–1.2)
Total Protein: 6.6 g/dL (ref 6.5–8.1)

## 2022-02-18 LAB — POCT I-STAT 7, (LYTES, BLD GAS, ICA,H+H)
Acid-base deficit: 6 mmol/L — ABNORMAL HIGH (ref 0.0–2.0)
Acid-base deficit: 6 mmol/L — ABNORMAL HIGH (ref 0.0–2.0)
Acid-base deficit: 7 mmol/L — ABNORMAL HIGH (ref 0.0–2.0)
Acid-base deficit: 5 mmol/L — ABNORMAL HIGH (ref 0.0–2.0)
Bicarbonate: 22.8 mmol/L (ref 20.0–28.0)
Bicarbonate: 22.3 mmol/L (ref 20.0–28.0)
Bicarbonate: 22.7 mmol/L (ref 20.0–28.0)
Bicarbonate: 23.8 mmol/L (ref 20.0–28.0)
Calcium, Ion: 1.28 mmol/L (ref 1.15–1.40)
Calcium, Ion: 1.21 mmol/L (ref 1.15–1.40)
Calcium, Ion: 1.22 mmol/L (ref 1.15–1.40)
Calcium, Ion: 1.23 mmol/L (ref 1.15–1.40)
HCT: 35 % — ABNORMAL LOW (ref 39.0–52.0)
HCT: 33 % — ABNORMAL LOW (ref 39.0–52.0)
HCT: 37 % — ABNORMAL LOW (ref 39.0–52.0)
HCT: 33 % — ABNORMAL LOW (ref 39.0–52.0)
Hemoglobin: 11.9 g/dL — ABNORMAL LOW (ref 13.0–17.0)
Hemoglobin: 11.2 g/dL — ABNORMAL LOW (ref 13.0–17.0)
Hemoglobin: 12.6 g/dL — ABNORMAL LOW (ref 13.0–17.0)
Hemoglobin: 11.2 g/dL — ABNORMAL LOW (ref 13.0–17.0)
O2 Saturation: 88 %
O2 Saturation: 100 %
O2 Saturation: 100 %
O2 Saturation: 100 %
Patient temperature: 98
Patient temperature: 100.4
Patient temperature: 100.4
Patient temperature: 99.5
Potassium: 4.6 mmol/L (ref 3.5–5.1)
Potassium: 4.8 mmol/L (ref 3.5–5.1)
Potassium: 4.8 mmol/L (ref 3.5–5.1)
Potassium: 5.5 mmol/L — ABNORMAL HIGH (ref 3.5–5.1)
Sodium: 165 mmol/L (ref 135–145)
Sodium: 163 mmol/L (ref 135–145)
Sodium: 164 mmol/L (ref 135–145)
Sodium: 162 mmol/L (ref 135–145)
TCO2: 25 mmol/L (ref 22–32)
TCO2: 24 mmol/L (ref 22–32)
TCO2: 25 mmol/L (ref 22–32)
TCO2: 26 mmol/L (ref 22–32)
pCO2 arterial: 58.1 mmHg — ABNORMAL HIGH (ref 32–48)
pCO2 arterial: 64.3 mmHg — ABNORMAL HIGH (ref 32–48)
pCO2 arterial: 65 mmHg — ABNORMAL HIGH (ref 32–48)
pCO2 arterial: 66.6 mmHg (ref 32–48)
pH, Arterial: 7.199 — CL (ref 7.35–7.45)
pH, Arterial: 7.15 — CL (ref 7.35–7.45)
pH, Arterial: 7.161 — CL (ref 7.35–7.45)
pH, Arterial: 7.163 — CL (ref 7.35–7.45)
pO2, Arterial: 68 mmHg — ABNORMAL LOW (ref 83–108)
pO2, Arterial: 404 mmHg — ABNORMAL HIGH (ref 83–108)
pO2, Arterial: 432 mmHg — ABNORMAL HIGH (ref 83–108)
pO2, Arterial: 341 mmHg — ABNORMAL HIGH (ref 83–108)

## 2022-02-18 LAB — CBC WITH DIFFERENTIAL/PLATELET
Abs Immature Granulocytes: 0.12 10*3/uL — ABNORMAL HIGH (ref 0.00–0.07)
Basophils Absolute: 0 10*3/uL (ref 0.0–0.1)
Basophils Relative: 1 %
Eosinophils Absolute: 0 10*3/uL (ref 0.0–0.5)
Eosinophils Relative: 0 %
HCT: 38.5 % — ABNORMAL LOW (ref 39.0–52.0)
Hemoglobin: 11.8 g/dL — ABNORMAL LOW (ref 13.0–17.0)
Immature Granulocytes: 1 %
Lymphocytes Relative: 7 %
Lymphs Abs: 0.6 10*3/uL — ABNORMAL LOW (ref 0.7–4.0)
MCH: 25.1 pg — ABNORMAL LOW (ref 26.0–34.0)
MCHC: 30.6 g/dL (ref 30.0–36.0)
MCV: 81.7 fL (ref 80.0–100.0)
Monocytes Absolute: 0.5 10*3/uL (ref 0.1–1.0)
Monocytes Relative: 5 %
Neutro Abs: 7.2 10*3/uL (ref 1.7–7.7)
Neutrophils Relative %: 86 %
Platelets: 236 10*3/uL (ref 150–400)
RBC: 4.71 MIL/uL (ref 4.22–5.81)
RDW: 19.1 % — ABNORMAL HIGH (ref 11.5–15.5)
WBC: 8.4 10*3/uL (ref 4.0–10.5)
nRBC: 0 % (ref 0.0–0.2)

## 2022-02-18 LAB — GLUCOSE, CAPILLARY
Glucose-Capillary: 123 mg/dL — ABNORMAL HIGH (ref 70–99)
Glucose-Capillary: 133 mg/dL — ABNORMAL HIGH (ref 70–99)
Glucose-Capillary: 134 mg/dL — ABNORMAL HIGH (ref 70–99)
Glucose-Capillary: 114 mg/dL — ABNORMAL HIGH (ref 70–99)

## 2022-02-18 LAB — SODIUM
Sodium: 162 mmol/L (ref 135–145)
Sodium: 161 mmol/L (ref 135–145)

## 2022-02-18 LAB — C-REACTIVE PROTEIN: CRP: 9.7 mg/dL — ABNORMAL HIGH (ref <1.0)

## 2022-02-18 MED ORDER — AMLODIPINE BESYLATE 10 MG PO TABS
10.0000 mg | ORAL_TABLET | Freq: Every day | ORAL | Status: DC
  Administered 2022-02-19 – 2022-02-23 (×5): 10 mg
  Filled 2022-02-18 (×5): qty 1

## 2022-02-18 MED ORDER — ETOMIDATE 2 MG/ML IV SOLN
INTRAVENOUS | Status: AC
  Administered 2022-02-18: 40 mg via INTRAVENOUS
  Filled 2022-02-18: qty 20

## 2022-02-18 MED ORDER — FENTANYL CITRATE PF 50 MCG/ML IJ SOSY
200.0000 ug | PREFILLED_SYRINGE | Freq: Once | INTRAMUSCULAR | Status: AC
  Filled 2022-02-18: qty 4

## 2022-02-18 MED ORDER — FENTANYL CITRATE PF 50 MCG/ML IJ SOSY
PREFILLED_SYRINGE | INTRAMUSCULAR | Status: AC
  Administered 2022-02-18: 200 ug via INTRAVENOUS
  Filled 2022-02-18: qty 2

## 2022-02-18 MED ORDER — HYDRALAZINE HCL 50 MG PO TABS
100.0000 mg | ORAL_TABLET | Freq: Three times a day (TID) | ORAL | Status: DC
  Administered 2022-02-18 – 2022-02-19 (×3): 100 mg
  Filled 2022-02-18 (×3): qty 2

## 2022-02-18 MED ORDER — ARIPIPRAZOLE 5 MG PO TABS
10.0000 mg | ORAL_TABLET | Freq: Every day | ORAL | Status: DC
  Administered 2022-02-19 – 2022-02-23 (×5): 10 mg
  Filled 2022-02-18: qty 1
  Filled 2022-02-18 (×3): qty 2
  Filled 2022-02-18: qty 1

## 2022-02-18 MED ORDER — CARVEDILOL 3.125 MG PO TABS
3.1250 mg | ORAL_TABLET | Freq: Two times a day (BID) | ORAL | Status: DC
  Administered 2022-02-18 – 2022-02-19 (×2): 3.125 mg
  Filled 2022-02-18 (×2): qty 1

## 2022-02-18 MED ORDER — GUAIFENESIN-DM 100-10 MG/5ML PO SYRP
10.0000 mL | ORAL_SOLUTION | ORAL | Status: DC | PRN
  Filled 2022-02-18: qty 10

## 2022-02-18 MED ORDER — LABETALOL HCL 5 MG/ML IV SOLN
20.0000 mg | Freq: Once | INTRAVENOUS | Status: AC
  Filled 2022-02-18: qty 4

## 2022-02-18 MED ORDER — CHLORHEXIDINE GLUCONATE 0.12% ORAL RINSE (MEDLINE KIT)
15.0000 mL | Freq: Two times a day (BID) | OROMUCOSAL | Status: DC
  Administered 2022-02-18 – 2022-02-26 (×16): 15 mL via OROMUCOSAL

## 2022-02-18 MED ORDER — SUCCINYLCHOLINE CHLORIDE 200 MG/10ML IV SOSY
PREFILLED_SYRINGE | INTRAVENOUS | Status: AC
  Filled 2022-02-18: qty 10

## 2022-02-18 MED ORDER — ORAL CARE MOUTH RINSE
15.0000 mL | OROMUCOSAL | Status: DC
  Administered 2022-02-18 – 2022-02-27 (×88): 15 mL via OROMUCOSAL

## 2022-02-18 MED ORDER — FENTANYL CITRATE PF 50 MCG/ML IJ SOSY
50.0000 ug | PREFILLED_SYRINGE | Freq: Once | INTRAMUSCULAR | Status: AC
  Administered 2022-02-18: 100 ug via INTRAVENOUS

## 2022-02-18 MED ORDER — MIDAZOLAM HCL 2 MG/2ML IJ SOLN
INTRAMUSCULAR | Status: AC
  Filled 2022-02-18: qty 2

## 2022-02-18 MED ORDER — PANTOPRAZOLE 2 MG/ML SUSPENSION: 40.0000 mg | Freq: Every day | ORAL | Status: DC

## 2022-02-18 MED ORDER — METHYLPREDNISOLONE SODIUM SUCC 40 MG IJ SOLR
40.0000 mg | INTRAMUSCULAR | Status: DC
  Administered 2022-02-18 – 2022-02-20 (×3): 40 mg via INTRAVENOUS
  Filled 2022-02-18 (×3): qty 1

## 2022-02-18 MED ORDER — MIDAZOLAM-SODIUM CHLORIDE 100-0.9 MG/100ML-% IV SOLN
0.0000 mg/h | INTRAVENOUS | Status: DC
  Administered 2022-02-18: 5 mg/h via INTRAVENOUS
  Administered 2022-02-19 – 2022-02-20 (×3): 8 mg/h via INTRAVENOUS
  Filled 2022-02-18 (×4): qty 100

## 2022-02-18 MED ORDER — ROCURONIUM BROMIDE 50 MG/5ML IV SOLN
100.0000 mg | Freq: Once | INTRAVENOUS | Status: AC
  Filled 2022-02-18: qty 10

## 2022-02-18 MED ORDER — PANTOPRAZOLE 2 MG/ML SUSPENSION
40.0000 mg | Freq: Every day | ORAL | Status: DC
  Administered 2022-02-19 – 2022-02-23 (×5): 40 mg
  Filled 2022-02-18 (×5): qty 20

## 2022-02-18 MED ORDER — LABETALOL HCL 100 MG PO TABS: 200.0000 mg | ORAL_TABLET | Freq: Two times a day (BID) | ORAL | Status: DC

## 2022-02-18 MED ORDER — CITALOPRAM HYDROBROMIDE 10 MG PO TABS
40.0000 mg | ORAL_TABLET | Freq: Every day | ORAL | Status: DC
  Administered 2022-02-19 – 2022-02-23 (×5): 40 mg
  Filled 2022-02-18 (×5): qty 4

## 2022-02-18 MED ORDER — ASCORBIC ACID 500 MG PO TABS
500.0000 mg | ORAL_TABLET | Freq: Every day | ORAL | Status: DC
  Administered 2022-02-19 – 2022-02-21 (×3): 500 mg
  Filled 2022-02-18 (×3): qty 1

## 2022-02-18 MED ORDER — FENTANYL 2500MCG IN NS 250ML (10MCG/ML) PREMIX INFUSION
50.0000 ug/h | INTRAVENOUS | Status: DC
  Administered 2022-02-18 – 2022-02-19 (×4): 200 ug/h via INTRAVENOUS
  Administered 2022-02-20: 100 ug/h via INTRAVENOUS
  Administered 2022-02-20 – 2022-02-21 (×2): 200 ug/h via INTRAVENOUS
  Administered 2022-02-22 – 2022-02-23 (×3): 250 ug/h via INTRAVENOUS
  Administered 2022-02-23: 200 ug/h via INTRAVENOUS
  Administered 2022-02-23: 300 ug/h via INTRAVENOUS
  Administered 2022-02-24: 100 ug/h via INTRAVENOUS
  Administered 2022-02-25: 125 ug/h via INTRAVENOUS
  Administered 2022-02-27: 400 ug/h via INTRAVENOUS
  Administered 2022-02-27: 150 ug/h via INTRAVENOUS
  Filled 2022-02-18 (×16): qty 250

## 2022-02-18 MED ORDER — FUROSEMIDE 10 MG/ML IJ SOLN
80.0000 mg | Freq: Once | INTRAMUSCULAR | Status: AC
  Administered 2022-02-18: 80 mg via INTRAVENOUS
  Filled 2022-02-18: qty 8

## 2022-02-18 MED ORDER — DEXTROSE-NACL 5-0.45 % IV SOLN: INTRAVENOUS | Status: DC

## 2022-02-18 MED ORDER — ZINC SULFATE 220 (50 ZN) MG PO CAPS
220.0000 mg | ORAL_CAPSULE | Freq: Every day | ORAL | Status: DC
Start: 2022-02-19 — End: 2022-02-22
  Administered 2022-02-19 – 2022-02-21 (×3): 220 mg
  Filled 2022-02-18 (×3): qty 1

## 2022-02-18 MED ORDER — KETAMINE HCL 50 MG/5ML IJ SOSY
PREFILLED_SYRINGE | INTRAMUSCULAR | Status: AC
  Filled 2022-02-18: qty 5

## 2022-02-18 MED ORDER — POLYETHYLENE GLYCOL 3350 17 G PO PACK
17.0000 g | PACK | Freq: Every day | ORAL | Status: DC
  Administered 2022-02-20 – 2022-02-23 (×4): 17 g
  Filled 2022-02-18 (×5): qty 1

## 2022-02-18 MED ORDER — ETOMIDATE 2 MG/ML IV SOLN: 40.0000 mg | Freq: Once | INTRAVENOUS | Status: AC

## 2022-02-18 MED ORDER — DOCUSATE SODIUM 50 MG/5ML PO LIQD
100.0000 mg | Freq: Two times a day (BID) | ORAL | Status: DC
  Administered 2022-02-18 – 2022-02-22 (×6): 100 mg
  Filled 2022-02-18 (×7): qty 10

## 2022-02-18 MED ORDER — MIDAZOLAM HCL 2 MG/2ML IJ SOLN
4.0000 mg | Freq: Once | INTRAMUSCULAR | Status: AC
  Administered 2022-02-18: 4 mg via INTRAVENOUS

## 2022-02-18 MED ORDER — STERILE WATER FOR INJECTION IV SOLN
INTRAVENOUS | Status: DC
  Filled 2022-02-18: qty 1000

## 2022-02-18 MED ORDER — MIDAZOLAM BOLUS VIA INFUSION
0.0000 mg | INTRAVENOUS | Status: DC | PRN
  Filled 2022-02-18: qty 5

## 2022-02-18 MED ORDER — MIDAZOLAM HCL 2 MG/2ML IJ SOLN: 2.0000 mg | INTRAMUSCULAR | Status: DC | PRN

## 2022-02-18 MED ORDER — ROCURONIUM BROMIDE 10 MG/ML (PF) SYRINGE
PREFILLED_SYRINGE | INTRAVENOUS | Status: AC
  Administered 2022-02-18: 100 mg via INTRAVENOUS
  Filled 2022-02-18: qty 10

## 2022-02-18 MED ORDER — HYDRALAZINE HCL 20 MG/ML IJ SOLN
INTRAMUSCULAR | Status: AC
  Filled 2022-02-18: qty 1

## 2022-02-18 MED ORDER — FENTANYL BOLUS VIA INFUSION
50.0000 ug | INTRAVENOUS | Status: DC | PRN
  Administered 2022-02-21: 100 ug via INTRAVENOUS
  Administered 2022-02-23: 50 ug via INTRAVENOUS
  Administered 2022-02-24: 100 ug via INTRAVENOUS
  Administered 2022-02-26 (×2): 50 ug via INTRAVENOUS
  Administered 2022-02-27 (×3): 100 ug via INTRAVENOUS
  Administered 2022-02-27: 50 ug via INTRAVENOUS
  Administered 2022-02-27: 100 ug via INTRAVENOUS
  Filled 2022-02-18: qty 100

## 2022-02-18 MED ORDER — DEXTROSE IN LACTATED RINGERS 5 % IV SOLN
INTRAVENOUS | Status: DC
Start: 2022-02-18 — End: 2022-02-18

## 2022-02-18 NOTE — Progress Notes (Signed)
SLP Cancellation Note ? ?Patient Details ?Name: Clinton Taylor ?MRN: [id number] ?DOB: Aug 29, 1986 ? ? ?Cancelled treatment:       Reason Eval/Treat Not Completed: Patient not medically ready. Pt on BIPAP this am, has a fever. Discussed with RN. If there is a concern for worsening function hold nectar thick liquids. Will transfer FEES plan to Friday pm tentatively, but will need to check in for readiness first.  ? ? ?Logun Colavito, Riley Nearing ?02/18/2022, 10:52 AM ?

## 2022-02-18 NOTE — Progress Notes (Signed)
Date and time results received: 02/18/22 0520 ?(use smartphrase ".now" to insert current time) ? ?Test: Chloride ?Critical Value: >130 ? ?Name of Provider Notified: EOtelia Limes ? ?Orders Received? Or Actions Taken?:  Awaiting orders ?

## 2022-02-18 NOTE — Progress Notes (Signed)
OT Cancellation Note ? ?Patient Details ?Name: Clinton Taylor ?MRN: [id number] ?DOB: 03-08-1986 ? ? ?Cancelled Treatment:    Reason Eval/Treat Not Completed: Medical issues which prohibited therapy (Nsg asking to hold at this time. Will check back another time.) ? ?Farron Watrous,HILLARY ?02/18/2022, 9:04 AM ?Maurie Boettcher, OT/L  ? ?Acute OT Clinical Specialist ?Acute Rehabilitation Services ?Pager [phone] ?Office [phone]  ?

## 2022-02-18 NOTE — Progress Notes (Addendum)
STROKE TEAM PROGRESS NOTE  ? ?INTERVAL HISTORY ?Wife and mother at the bedside.  Patient has been hemodynamically stable overnight and his neurological exam is stable.  He had increased dysarthria overnight and a head CT was ordered.  This was stable.  He is having more difficulty breathing on the BiPAP and will be intubated today by CCM. ? ?Vitals:  ? 02/18/22 1142 02/18/22 1150 02/18/22 1155 02/18/22 1157  ?BP: (!) 148/77 (!) 168/87 (!) 172/83 (!) 170/83  ?Pulse: 81 78 80 79  ?Resp: (!) 35 (!) 35 (!) 35 (!) 35  ?Temp:      ?TempSrc:      ?SpO2: (!) 83% (!) 89% 90% 90%  ?Weight:      ?Height:      ? ?CBC:  ?Recent Labs  ?Lab 02/17/22 ?0433 02/18/22 ?0411  ?WBC 11.3* 8.4  ?NEUTROABS 8.9* 7.2  ?HGB 11.5* 11.8*  ?HCT 37.0* 38.5*  ?MCV 81.3 81.7  ?PLT 256 236  ? ? ?Basic Metabolic Panel:  ?Recent Labs  ?Lab 01/20/2022 ?1751 02/03/2022 ?2042 02/15/22 ?[id number] 02/15/22 ?[id number] 02/17/22 ?[id number] 02/17/22 ?1156 02/18/22 ?0411 02/18/22 ?1050  ?NA 138   < > 148*   < > 161*   < > 160* 162*  ?K  --    < > 3.9   < > 3.9  --  4.4  --   ?CL  --    < > 120*   < > >130*  --  >130*  --   ?CO2  --    < > 21*   < > 18*  --  18*  --   ?GLUCOSE  --    < > 113*   < > 99  --  130*  --   ?BUN  --    < > 28*   < > 37*  --  37*  --   ?CREATININE  --    < > 3.37*   < > 3.72*  --  3.96*  --   ?CALCIUM  --    < > 8.6*   < > 8.9  --  8.9  --   ?MG 2.4  --  2.3  --   --   --   --   --   ? < > = values in this interval not displayed.  ? ? ?Lipid Panel:  ?Recent Labs  ?Lab 02/14/22 ?0352 02/15/22 ?[id number] 02/17/22 ?0433  ?CHOL 192  --   --   ?TRIG 309*   < > 354*  ?HDL 20*  --   --   ?CHOLHDL 9.6  --   --   ?VLDL 62*  --   --   ?LDLCALC 110*  --   --   ? < > = values in this interval not displayed.  ? ? ?HgbA1c:  ?Recent Labs  ?Lab 01/23/2022 ?1751  ?HGBA1C 5.5  ? ? ?Urine Drug Screen:  ?Recent Labs  ?Lab 01/22/2022 ?1601  ?LABOPIA NONE DETECTED  ?COCAINSCRNUR NONE DETECTED  ?LABBENZ NONE DETECTED  ?AMPHETMU NONE DETECTED  ?THCU NONE DETECTED  ?LABBARB NONE DETECTED   ? ?  ?Alcohol Level  ?Recent Labs  ?Lab 01/16/2022 ?1242  ?ETH <10  ? ? ? ?IMAGING past 24 hours ?CT HEAD WO CONTRAST (5MM) ? ?Result Date: 02/18/2022 ?CLINICAL DATA:  Stroke, hemorrhagic Increased dysarthria EXAM: CT HEAD WITHOUT CONTRAST TECHNIQUE: Contiguous axial images were obtained from the base of the skull through the vertex without intravenous contrast. RADIATION DOSE REDUCTION: This exam was  performed according to the departmental dose-optimization program which includes automated exposure control, adjustment of the mA and/or kV according to patient size and/or use of iterative reconstruction technique. COMPARISON:  None Available. FINDINGS: Brain: Similar size of an intraparenchymal hemorrhage centered in left basal ganglia. Slight increase in surrounding edema. Mass effect is similar with approximately 6 mm of rightward midline shift. Similar effacement of the left lateral ventricle. No progressive ventriculomegaly. No evidence of acute large vascular territory infarct on motion limited assessment. Vascular: No hyperdense vessel is identified. Skull: No acute fracture. Sinuses/Orbits: Left maxillary sinus retention cyst. Otherwise, clear sinuses. Other: No mastoid effusions. IMPRESSION: Similar size of an intraparenchymal hemorrhage centered in the left basal ganglia with slight increase in surrounding edema. Similar mass effect with 6 mm of rightward midline shift. Electronically Signed   By: [name]   On: 02/18/2022 08:53   ? ?PHYSICAL EXAM ?General:  Drowsy, morbid obese, well-developed patient  ?Respiratory: Respirations somewhat labored on BiPAP ? ? ? ?NEURO:  ?Mental Status: AA&Ox3  ?Speech/Language: speech is with some dysarthria  Comprehension intact, but patient speaks in short words and phrases due to dyspnea ? ?Cranial Nerves:  ?II: PERRL.  ?III, IV, VI: EOMI. Eyelids elevate symmetrically.  ?V: Sensation is intact to light touch and symmetrical to face.  ?VII: Smile is  symmetrical.  ?VIII: hearing intact to voice. ?XII: tongue is midline without fasciculations. ?Motor: 5/5 strength to LUE and LLE, 0/5 to RUE and RLE ?Tone: is normal and bulk is normal ?Sensation- Intact to light touch bilaterally.  ?Coordination: FTN intact on left .No drift on left.  ?Gait- deferred ? ? ? ?ASSESSMENT/PLAN ?[name] is a 36 y.o. male with history of HTN, HLD, obesity, smoking, OSA on CPAP and mood disorder presenting with acute right hemiplegia with right sided numbness.  He was taken to [facility], was found to have an [place] and was transferred here for further management.  ICH score is 1.  Head CT last night reveals increasing left to right midline shift to 10mm without hydrocephalus.  3% HTS is off now due to Na 160.  Patient has confirmed covid-19 infection.  His wife reports that he smokes cigarettes and is not always compliant with his medical therapy at home. ? ?ICH:  left large ICH involving corona radiata, lentiform nucleus and subinsular white matter with 37mm left to right midline shift  ?CT head IPH involving left corona radiata, left lentiform nucleus and subinsular white matter with regional mass effect and 52mm midline shift ?CTA Head and Neck Nondiagnostic exam ?Follow up CT 4/29 2112 unchanged size of left ICH with increased edema and 24mm left to right midline shift ?CT head 4/30 unchanged hematoma size, midline shift 7 mm ?CT repeat 5/1 stable hematoma, midline shift 5 mm ?CT repeat 5/4 stable IPH with 52mm midline shift ?Consider MRI and MRA once stable off BiPAP ?2D Echo EF 60-65%, interatrial septum not well visualized ?LDL 110 ?HgbA1c 5.5 ?VTE prophylaxis - SCDs ?No antithrombotic prior to admission, now on No antithrombotic secondary to [place] ?Therapy recommendations:  CIR ?Disposition:  pending ? ?Cerebral Edema (brain compression) ?Cerebral edema present from [place] with 68mm midline shift ?3% HTS at 75 cc/hr -> NS @ 50-> off ?CT Head 4/30 unchanged hematoma, midline shift  7 mm ?CT head 5/1 midline shift 48mm ?CT head 5/4 midline shift 63mm ?Na goal 150-155 ?Na 148->151->152->159-> 162 ?Na monitoring Q6h ? ?Hypertensive emergency ?Home meds:  losartan-HCTZ 100-12.5 daily, amlodipine 10 mg  daily ?Unstable ?On cleviprex drip ?Also on cardizem drip ?Keep SBP < 160 ?On amlodipine 10 and hydralazine IV Q8h ?Long-term BP goal normotensive ? ?AKI with ATN ?Cre 2.59->2.72->2.90->3.37->3.56-> 3.72-> 3.96 ?Avoid nephrotoxic agent ?Nephro on board, appreciate recommendations ?Considering ATN ? ?Covid-19 infection with respiratory failure ?Patient is positive for covid-19 infection ?Mild hypoxia, will use supplemental O2 via [place] ?CCM on board ?On decadron IV ?On BiPAP while sleeping ?Per report, pt refused remedesivir and paxlovid  ?Will be intubated today by CCM ?Ventilator management by CCM ? ?Hyperlipidemia ?Home meds:  none ?LDL 110, goal < 70 ?Hold off statin now given ICH ?Add statin at discharge ? ?Dysphagia ?Passed swallow ?Off IVF ? ?Tobacco abuse ?Current smoker ?Smoking cessation counseling provided ?Nicotine patch provided ?Pt is willing to quit ? ?Other Stroke Risk Factors ?Obesity, Body mass index is 56.52 kg/m?., BMI >/= 30 associated with increased stroke risk, recommend weight loss, diet and exercise as appropriate  ?Obstructive sleep apnea, on CPAP at home ? ?Other Active Problems ?Leukocytosis WBC 15.2-12.4->12.9->11.5-> 11.3-> 8.4 ?Mood disorder ? ?Hospital day # 5 ? ?[place] , MSN, AGACNP-BC ?[facility] ?See Amion for schedule and pager information ?02/18/2022 12:43 PM ?  ?ATTENDING NOTE: ?I reviewed above note and agree with the assessment and plan. Pt was seen and examined.  ? ?No family at bedside.  Patient still struggle with respiratory distress, eventually patient was intubated this morning.  Currently under sedation, post intubation, not much seen on neuro exam.  However PERRL, weak corneal reflexes bilaterally and possible gag.  CT repeat this  morning showed stable hematoma and midline shift.  Respiratory failure and vent management per CCM. ? ?For detailed assessment and plan, please refer to above as I have made changes wherever appropriate.  ? ?Jind

## 2022-02-18 NOTE — Procedures (Signed)
Arterial Catheter Insertion Procedure Note ? ?Castle Lamons  ?[id number]  ?1986-07-22 ? ?Date:02/18/22  ?Time:12:58 PM  ? ? ?Provider Performing: Steffanie Dunn  ? ? ?Procedure: Insertion of Arterial Line (76160) with US guidance ([id number])  ? ?Indication(s) ?Blood pressure monitoring and/or need for frequent ABGs ? ?Consent ?Risks of the procedure as well as the alternatives and risks of each were explained to the patient and/or caregiver.  Consent for the procedure was obtained and is signed in the bedside chart ? ?Anesthesia ?None ? ? ?Time Out ?Verified patient identification, verified procedure, site/side was marked, verified correct patient position, special equipment/implants available, medications/allergies/relevant history reviewed, required imaging and test results available. ? ? ?Sterile Technique ?Maximal sterile technique including full sterile barrier drape, hand hygiene, sterile gown, sterile gloves, mask, hair covering, sterile ultrasound probe cover (if used). ? ? ?Procedure Description ?Area of catheter insertion was cleaned with chlorhexidine and draped in sterile fashion. With real-time ultrasound guidance an arterial catheter was placed into the right radial artery. Despite appropriate placement on [place] the guidewire could not advance. Procedure aborted.  ? ? ?Complications/Tolerance ?None; patient tolerated the procedure well. ? ? ?EBL ?Minimal ? ? ?Specimen(s) ?None ? ?Steffanie Dunn, DO 02/18/22 12:59 PM ?[facility] ? ? ?
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
## 2022-02-18 NOTE — Progress Notes (Signed)
PT Cancellation Note ? ?Patient Details ?Name: Clinton Taylor ?MRN: [id number] ?DOB: [birth year] ? ? ?Cancelled Treatment:    Reason Eval/Treat Not Completed: Medical issues which prohibited therapy; patient intubated.  Will attempt another day. ? ? ?Elray Mcgregor ?02/18/2022, 3:10 PM ?Sheran Lawless, PT ?Acute Rehabilitation Services ?Pager:[phone] ?Office:[phone] ?02/18/2022 ? ?

## 2022-02-18 NOTE — Procedures (Signed)
Arterial Catheter Insertion Procedure Note ? ?Clinton Taylor  ?[id number]  ?04-14-1986 ? ?Date:02/18/22  ?Time:1:14 PM  ? ? ?Provider Performing: Cristal Generous  ? ? ?Procedure: Insertion of Arterial Line [phone]) with US guidance [id number])  ? ?Indication(s) ?Blood pressure monitoring and/or need for frequent ABGs ? ?Consent ?Risks of the procedure as well as the alternatives and risks of each were explained to the patient and/or caregiver.  Consent for the procedure was obtained and is signed in the bedside chart ? ?Anesthesia ?None ? ? ?Time Out ?Verified patient identification, verified procedure, site/side was marked, verified correct patient position, special equipment/implants available, medications/allergies/relevant history reviewed, required imaging and test results available. ? ? ?Sterile Technique ?Maximal sterile technique including full sterile barrier drape, hand hygiene, sterile gown, sterile gloves, mask, hair covering, sterile ultrasound probe cover (if used). ? ? ?Procedure Description ?Area of catheter insertion was cleaned with chlorhexidine and draped in sterile fashion. With real-time ultrasound guidance an arterial catheter was placed into right radial artery. Arrow tip directly visualized within the right radial artery using ultrasound however there was no flash. With prior unsuccessful attempts, I turned my attention to the patient's lower extremity. ?Area of  right posterior tibial artery and the right dorsalis pedis artery were cleaned with chlorehexidine. Right dorsalis pedis artery visualized with ultrasound and felt viable for arterial line placement. With real-time ultrasound guidance an arterial catheter was placed into the   right  dorsalis pedis  artery.  Appropriate arterial tracings confirmed on monitor.  Arterial line sutured in place. Biopatch and sterile dressing placed.  ? ? ?Complications/Tolerance ?None; patient tolerated the procedure  well. ? ? ?EBL ?Minimal ? ? ?Specimen(s) ?None  ? ?Eliseo Gum MSN, AGACNP-BC ?[facility] ?Amion for pager  ?02/18/2022, 1:20 PM ? ? ?

## 2022-02-18 NOTE — Progress Notes (Signed)
All IV lines changed and switched to CVC ?

## 2022-02-18 NOTE — Progress Notes (Signed)
Patient ID: Clinton Taylor, male   DOB: Jun 26, 1986, 36 y.o.   MRN: [id number] ?[facility] ?Progress Note  ? ?Assessment/ Plan:   ?1.  Acute kidney injury on chronic kidney disease stage IIIb (labs from last month showed creatinine 2.39):  Based on the history and timeline of events, this appears to be hemodynamically mediated with recent contrast exposure and fluctuations in blood pressure likely resulting in ATN.  Excellent urine output overnight with mildly worse renal function, urine studies re-ordered ?2.  Hypernatremia: Medically induced with hypertonic saline to mitigate risk from cerebral edema.  He was transitioned from hypertonic saline to normal saline overnight with most recent sodium level of 161 and today I will switch him to D% 1/2NS with monitoring of sodium levels to keep this around 150. ?3.  Hypertensive emergency with acute left-sided intracerebral hemorrhage: With right-sided hemiparesis at this time with ongoing efforts at blood pressure control at this time. ?4.  Acute hypoxic respiratory failure: On noninvasive positive pressure ventilation along with Decadron treatment for COVID-19 infection and community-acquired pneumonia on ceftriaxone and azithromycin. ?5.  Metabolic acidosis: Mild and likely associated with acute kidney injury, will continue to follow labs. ? ?Subjective:   ?No acute events overnight-- needing BiPAP for oxygenation.   ? ?Objective:   ?BP (!) 163/93   Pulse 97   Temp (!) 101.4 ?F (38.6 ?C) (Axillary)   Resp (!) 32   Ht 5\' 9"  (1.753 m)   Wt (!) 173.6 kg   SpO2 93%   BMI 56.52 kg/m?  ? ?Intake/Output Summary (Last 24 hours) at 02/18/2022 0936 ?Last data filed at 02/18/2022 0600 ?Gross per 24 hour  ?Intake 3028.76 ml  ?Output 1950 ml  ?Net 1078.76 ml  ? ?Weight change: -2.5 kg ? ?Physical Exam: ?Gen: Comfortably resting in bed, wife at bedside. ?CVS: Pulse regular rhythm, normal rate, S1 and S2 normal ?Resp: Clear to auscultation bilaterally, no distinct  rales or rhonchi.  On BIPAP ?Abd: Soft, obese, nontender, bowel sounds normal ?Ext: 1+ bilateral lower extremity edema, right-sided hemiparesis ? ?Imaging: ?CT HEAD WO CONTRAST (5MM) ? ?Result Date: 02/18/2022 ?CLINICAL DATA:  Stroke, hemorrhagic Increased dysarthria EXAM: CT HEAD WITHOUT CONTRAST TECHNIQUE: Contiguous axial images were obtained from the base of the skull through the vertex without intravenous contrast. RADIATION DOSE REDUCTION: This exam was performed according to the departmental dose-optimization program which includes automated exposure control, adjustment of the mA and/or kV according to patient size and/or use of iterative reconstruction technique. COMPARISON:  None Available. FINDINGS: Brain: Similar size of an intraparenchymal hemorrhage centered in left basal ganglia. Slight increase in surrounding edema. Mass effect is similar with approximately 6 mm of rightward midline shift. Similar effacement of the left lateral ventricle. No progressive ventriculomegaly. No evidence of acute large vascular territory infarct on motion limited assessment. Vascular: No hyperdense vessel is identified. Skull: No acute fracture. Sinuses/Orbits: Left maxillary sinus retention cyst. Otherwise, clear sinuses. Other: No mastoid effusions. IMPRESSION: Similar size of an intraparenchymal hemorrhage centered in the left basal ganglia with slight increase in surrounding edema. Similar mass effect with 6 mm of rightward midline shift. Electronically Signed   By: [name]   On: 02/18/2022 08:53   ? ?Labs: ?BMET ?Recent Labs  ?Lab 01/18/2022 ?1242 01/16/2022 ?1751 01/31/2022 ?2042 02/11/2022 ?2226 02/14/22 ?[id number] 02/14/22 ?[id number] 02/15/22 ?[id number] 02/15/22 ?[id number] 02/16/22 ?[id number] 02/16/22 ?[id number] 02/16/22 ?1618 02/16/22 ?2311 02/17/22 ?0433 02/17/22 ?1156 02/17/22 ?1639 02/17/22 ?2210 02/18/22 ?0411  ?NA 138   < > 137   < >  138   < > 148*   < > 154*   < > 160* 161* 161* 162* 161* 161* 160*  ?K 2.9*  --  3.4*  --  3.1*  --   3.9  --  4.1  --   --   --  3.9  --   --   --  4.4  ?CL 103  --  102  --  108  --  120*  --  126*  --   --   --  >130*  --   --   --  >130*  ?CO2 23  --  25  --  23  --  21*  --  19*  --   --   --  18*  --   --   --  18*  ?GLUCOSE 125*  --  119*  --  134*  --  113*  --  106*  --   --   --  99  --   --   --  130*  ?BUN 29*  --  25*  --  26*  --  28*  --  34*  --   --   --  37*  --   --   --  37*  ?CREATININE 2.59*  --  2.72*  --  2.90*  --  3.37*  --  3.56*  --   --   --  3.72*  --   --   --  3.96*  ?CALCIUM 8.9  --  8.8*  --  8.4*  --  8.6*  --  8.6*  --   --   --  8.9  --   --   --  8.9  ? < > = values in this interval not displayed.  ? ?CBC ?Recent Labs  ?Lab 02/15/22 ?0452 02/16/22 ?0331 02/17/22 ?[id number] 02/18/22 ?0411  ?WBC 12.9* 11.5* 11.3* 8.4  ?NEUTROABS 11.0* 10.0* 8.9* 7.2  ?HGB 11.4* 11.6* 11.5* 11.8*  ?HCT 36.4* 37.3* 37.0* 38.5*  ?MCV 80.5 81.3 81.3 81.7  ?PLT 232 239 256 236  ? ? ?Medications:   ? ? amLODipine  10 mg Oral Daily  ? ARIPiprazole  10 mg Oral Daily  ? vitamin C  500 mg Oral Daily  ? Chlorhexidine Gluconate Cloth  6 each Topical Q0600  ? citalopram  40 mg Oral Daily  ? dexamethasone (DECADRON) injection  6 mg Intravenous Q24H  ? hydrALAZINE  20 mg Intravenous Q8H  ? insulin aspart  0-15 Units Subcutaneous Q4H  ? ipratropium-albuterol  3 mL Nebulization TID  ? liothyronine  5 mcg Oral QAC breakfast  ? nicotine  21 mg Transdermal Daily  ? zinc sulfate  220 mg Oral Daily  ? ?Elmarie Shiley, MD ?02/18/2022, 9:36 AM  ? ?

## 2022-02-18 NOTE — Procedures (Signed)
Intubation Procedure Note ? ?Clinton Taylor  ?[id number]  ?08-21-1986 ? ?Date:02/18/22  ?Time:12:55 PM  ? ?Provider Performing:Mahkayla Preece Naomie Dean  ? ? ?Procedure: Intubation ([id number]) ? ?Indication(s) ?Respiratory Failure ? ?Consent ?Risks of the procedure as well as the alternatives and risks of each were explained to the patient and/or caregiver.  Consent for the procedure was obtained and is signed in the bedside chart ? ? ?Anesthesia ?Etomidate, Versed, and Fentanyl ? ? ?Time Out ?Verified patient identification, verified procedure, site/side was marked, verified correct patient position, special equipment/implants available, medications/allergies/relevant history reviewed, required imaging and test results available. ? ? ?Sterile Technique ?Usual hand hygeine, masks, and gloves were used ? ? ?Procedure Description ?Patient positioned in bed supine.  Sedation given as noted above.  Patient was intubated with endotracheal tube using Glidescope.  View was Grade 1 full glottis .  Number of attempts was 1.  Colorimetric CO2 detector was consistent with tracheal placement. ? ? ?Complications/Tolerance ?None; patient tolerated the procedure well. ?Chest X-ray is ordered to verify placement. ? ? ?EBL ?Minimal ? ? ?Specimen(s) ?None ? ?Clinton Hy, DO 02/18/22 12:55 PM ?[facility] ? ?

## 2022-02-18 NOTE — Progress Notes (Signed)
B/p in right arm not taking switched to left leg ? ?

## 2022-02-18 NOTE — Progress Notes (Addendum)
eLink Physician-Brief Progress Note ?Patient Name: Bastien Strawser ?DOB: [birth year] ?MRN: [id number] ? ? ?Date of Service ? 02/18/2022  ?HPI/Events of Note ? ABG on 100%/PRVC 35/TV 460/P 18 = 7.163/66.6/341/23.8. Patient difficult to ventilate d/t COVID pneumonia. Last Na= 161-162 which limits the use of sodium bicarb. No room to push PRVC rate or TV. Will have to tolerate permissive hypercapnea. Will start a NaHCO3 IV infusion. Tolerate pH > 7.2. Follow Na+ level closely.   ?eICU Interventions ? Plan: ?NaHCO3 IV infusion at 25 mL/hour.  ?Continue to trend Na+ Q 6 hours.   ? ? ? ?Intervention Category ?Major Interventions: Acid-Base disturbance - evaluation and management;Respiratory failure - evaluation and management ? ?Tiffny Gemmer Dennard Nip ?02/18/2022, 11:46 PM ?

## 2022-02-18 NOTE — Procedures (Signed)
Central Venous Catheter Insertion Procedure Note ? ?Marzell Isakson  ?[id number]  ?[birth year] ? ?Date:02/18/22  ?Time:12:57 PM  ? ?Provider Performing:Lejla Moeser Audrie Lia  ? ?Procedure: Insertion of Non-tunneled Central Venous Catheter(36556) with US guidance (85631)  ? ?Indication(s) ?Medication administration and Difficult access ? ?Consent ?Risks of the procedure as well as the alternatives and risks of each were explained to the patient and/or caregiver.  Consent for the procedure was obtained and is signed in the bedside chart ? ?Anesthesia ?Topical only with 1% lidocaine  ? ?Timeout ?Verified patient identification, verified procedure, site/side was marked, verified correct patient position, special equipment/implants available, medications/allergies/relevant history reviewed, required imaging and test results available. ? ?Sterile Technique ?Maximal sterile technique including full sterile barrier drape, hand hygiene, sterile gown, sterile gloves, mask, hair covering, sterile ultrasound probe cover (if used). ? ?Procedure Description ?Area of catheter insertion was cleaned with chlorhexidine and draped in sterile fashion.  With real-time ultrasound guidance a central venous catheter was placed into the left internal jugular vein. Nonpulsatile blood flow and easy flushing noted in all ports.  Guidewire confirmed in compressible vein. The catheter was sutured in place and sterile dressing applied. ? ?Complications/Tolerance ?None; patient tolerated the procedure well. ?Chest X-ray is ordered to verify placement for internal jugular or subclavian cannulation.  Chest x-ray is not ordered for femoral cannulation. ? ?EBL ?Minimal ? ?Specimen(s) ?None ? ? ?Steffanie Dunn, DO 02/18/22 12:57 PM ?[facility] ? ? ?

## 2022-02-19 ENCOUNTER — Inpatient Hospital Stay (HOSPITAL_COMMUNITY): Payer: BC Managed Care – PPO

## 2022-02-19 DIAGNOSIS — I10 Essential (primary) hypertension: Secondary | ICD-10-CM | POA: Diagnosis not present

## 2022-02-19 DIAGNOSIS — N170 Acute kidney failure with tubular necrosis: Secondary | ICD-10-CM | POA: Diagnosis not present

## 2022-02-19 DIAGNOSIS — I610 Nontraumatic intracerebral hemorrhage in hemisphere, subcortical: Secondary | ICD-10-CM | POA: Diagnosis not present

## 2022-02-19 DIAGNOSIS — U071 COVID-19: Secondary | ICD-10-CM | POA: Diagnosis not present

## 2022-02-19 LAB — RENAL FUNCTION PANEL
Albumin: 2.8 g/dL — ABNORMAL LOW (ref 3.5–5.0)
Albumin: 2.7 g/dL — ABNORMAL LOW (ref 3.5–5.0)
Anion gap: 8 (ref 5–15)
Anion gap: 8 (ref 5–15)
BUN: 59 mg/dL — ABNORMAL HIGH (ref 6–20)
BUN: 55 mg/dL — ABNORMAL HIGH (ref 6–20)
CO2: 23 mmol/L (ref 22–32)
CO2: 24 mmol/L (ref 22–32)
Calcium: 8.2 mg/dL — ABNORMAL LOW (ref 8.9–10.3)
Calcium: 8.1 mg/dL — ABNORMAL LOW (ref 8.9–10.3)
Chloride: 128 mmol/L — ABNORMAL HIGH (ref 98–111)
Chloride: 125 mmol/L — ABNORMAL HIGH (ref 98–111)
Creatinine, Ser: 6.28 mg/dL — ABNORMAL HIGH (ref 0.61–1.24)
Creatinine, Ser: 6.24 mg/dL — ABNORMAL HIGH (ref 0.61–1.24)
GFR, Estimated: 11 mL/min — ABNORMAL LOW (ref >60)
GFR, Estimated: 11 mL/min — ABNORMAL LOW (ref >60)
Glucose, Bld: 136 mg/dL — ABNORMAL HIGH (ref 70–99)
Glucose, Bld: 103 mg/dL — ABNORMAL HIGH (ref 70–99)
Phosphorus: 9.6 mg/dL — ABNORMAL HIGH (ref 2.5–4.6)
Phosphorus: 8 mg/dL — ABNORMAL HIGH (ref 2.5–4.6)
Potassium: 6.6 mmol/L (ref 3.5–5.1)
Potassium: 5.6 mmol/L — ABNORMAL HIGH (ref 3.5–5.1)
Sodium: 159 mmol/L — ABNORMAL HIGH (ref 135–145)
Sodium: 157 mmol/L — ABNORMAL HIGH (ref 135–145)

## 2022-02-19 LAB — CBC WITH DIFFERENTIAL/PLATELET
Abs Immature Granulocytes: 0.27 10*3/uL — ABNORMAL HIGH (ref 0.00–0.07)
Abs Immature Granulocytes: 0.29 10*3/uL — ABNORMAL HIGH (ref 0.00–0.07)
Basophils Absolute: 0.1 10*3/uL (ref 0.0–0.1)
Basophils Absolute: 0.1 10*3/uL (ref 0.0–0.1)
Basophils Relative: 1 %
Basophils Relative: 1 %
Eosinophils Absolute: 0 10*3/uL (ref 0.0–0.5)
Eosinophils Absolute: 0 10*3/uL (ref 0.0–0.5)
Eosinophils Relative: 0 %
Eosinophils Relative: 0 %
HCT: 39.8 % (ref 39.0–52.0)
HCT: 40.7 % (ref 39.0–52.0)
Hemoglobin: 11.3 g/dL — ABNORMAL LOW (ref 13.0–17.0)
Hemoglobin: 11.2 g/dL — ABNORMAL LOW (ref 13.0–17.0)
Immature Granulocytes: 2 %
Immature Granulocytes: 3 %
Lymphocytes Relative: 6 %
Lymphocytes Relative: 8 %
Lymphs Abs: 0.7 10*3/uL (ref 0.7–4.0)
Lymphs Abs: 0.9 10*3/uL (ref 0.7–4.0)
MCH: 25.2 pg — ABNORMAL LOW (ref 26.0–34.0)
MCH: 24.7 pg — ABNORMAL LOW (ref 26.0–34.0)
MCHC: 28.4 g/dL — ABNORMAL LOW (ref 30.0–36.0)
MCHC: 27.5 g/dL — ABNORMAL LOW (ref 30.0–36.0)
MCV: 88.6 fL (ref 80.0–100.0)
MCV: 89.6 fL (ref 80.0–100.0)
Monocytes Absolute: 0.6 10*3/uL (ref 0.1–1.0)
Monocytes Absolute: 0.8 10*3/uL (ref 0.1–1.0)
Monocytes Relative: 5 %
Monocytes Relative: 7 %
Neutro Abs: 9.4 10*3/uL — ABNORMAL HIGH (ref 1.7–7.7)
Neutro Abs: 9.5 10*3/uL — ABNORMAL HIGH (ref 1.7–7.7)
Neutrophils Relative %: 86 %
Neutrophils Relative %: 81 %
Platelets: 221 10*3/uL (ref 150–400)
Platelets: 208 10*3/uL (ref 150–400)
RBC: 4.49 MIL/uL (ref 4.22–5.81)
RBC: 4.54 MIL/uL (ref 4.22–5.81)
RDW: 20 % — ABNORMAL HIGH (ref 11.5–15.5)
RDW: 19.9 % — ABNORMAL HIGH (ref 11.5–15.5)
WBC: 11 10*3/uL — ABNORMAL HIGH (ref 4.0–10.5)
WBC: 11.6 10*3/uL — ABNORMAL HIGH (ref 4.0–10.5)
nRBC: 0.2 % (ref 0.0–0.2)
nRBC: 0.3 % — ABNORMAL HIGH (ref 0.0–0.2)

## 2022-02-19 LAB — POCT I-STAT 7, (LYTES, BLD GAS, ICA,H+H)
Acid-Base Excess: 3 mmol/L — ABNORMAL HIGH (ref 0.0–2.0)
Acid-base deficit: 4 mmol/L — ABNORMAL HIGH (ref 0.0–2.0)
Acid-base deficit: 4 mmol/L — ABNORMAL HIGH (ref 0.0–2.0)
Acid-base deficit: 2 mmol/L (ref 0.0–2.0)
Bicarbonate: 25.8 mmol/L (ref 20.0–28.0)
Bicarbonate: 26.9 mmol/L (ref 20.0–28.0)
Bicarbonate: 27.6 mmol/L (ref 20.0–28.0)
Bicarbonate: 33 mmol/L — ABNORMAL HIGH (ref 20.0–28.0)
Calcium, Ion: 1.17 mmol/L (ref 1.15–1.40)
Calcium, Ion: 1.21 mmol/L (ref 1.15–1.40)
Calcium, Ion: 1.23 mmol/L (ref 1.15–1.40)
Calcium, Ion: 1.25 mmol/L (ref 1.15–1.40)
HCT: 32 % — ABNORMAL LOW (ref 39.0–52.0)
HCT: 33 % — ABNORMAL LOW (ref 39.0–52.0)
HCT: 33 % — ABNORMAL LOW (ref 39.0–52.0)
HCT: 34 % — ABNORMAL LOW (ref 39.0–52.0)
Hemoglobin: 10.9 g/dL — ABNORMAL LOW (ref 13.0–17.0)
Hemoglobin: 11.2 g/dL — ABNORMAL LOW (ref 13.0–17.0)
Hemoglobin: 11.2 g/dL — ABNORMAL LOW (ref 13.0–17.0)
Hemoglobin: 11.6 g/dL — ABNORMAL LOW (ref 13.0–17.0)
O2 Saturation: 100 %
O2 Saturation: 100 %
O2 Saturation: 100 %
O2 Saturation: 100 %
Patient temperature: 100.4
Patient temperature: 97.5
Patient temperature: 97.5
Patient temperature: 99.1
Potassium: 5.3 mmol/L — ABNORMAL HIGH (ref 3.5–5.1)
Potassium: 5.6 mmol/L — ABNORMAL HIGH (ref 3.5–5.1)
Potassium: 5.8 mmol/L — ABNORMAL HIGH (ref 3.5–5.1)
Potassium: 6.2 mmol/L — ABNORMAL HIGH (ref 3.5–5.1)
Sodium: 150 mmol/L — ABNORMAL HIGH (ref 135–145)
Sodium: 154 mmol/L — ABNORMAL HIGH (ref 135–145)
Sodium: 159 mmol/L — ABNORMAL HIGH (ref 135–145)
Sodium: 160 mmol/L — ABNORMAL HIGH (ref 135–145)
TCO2: 28 mmol/L (ref 22–32)
TCO2: 29 mmol/L (ref 22–32)
TCO2: 30 mmol/L (ref 22–32)
TCO2: 36 mmol/L — ABNORMAL HIGH (ref 22–32)
pCO2 arterial: 71.8 mmHg (ref 32–48)
pCO2 arterial: 75.9 mmHg (ref 32–48)
pCO2 arterial: 85.9 mmHg (ref 32–48)
pCO2 arterial: 87.5 mmHg (ref 32–48)
pH, Arterial: 7.102 — CL (ref 7.35–7.45)
pH, Arterial: 7.14 — CL (ref 7.35–7.45)
pH, Arterial: 7.189 — CL (ref 7.35–7.45)
pH, Arterial: 7.189 — CL (ref 7.35–7.45)
pO2, Arterial: 226 mmHg — ABNORMAL HIGH (ref 83–108)
pO2, Arterial: 267 mmHg — ABNORMAL HIGH (ref 83–108)
pO2, Arterial: 309 mmHg — ABNORMAL HIGH (ref 83–108)
pO2, Arterial: 473 mmHg — ABNORMAL HIGH (ref 83–108)

## 2022-02-19 LAB — PROTEIN / CREATININE RATIO, URINE
Creatinine, Urine: 332.24 mg/dL
Protein Creatinine Ratio: 0.79 mg/mg{Cre} — ABNORMAL HIGH (ref 0.00–0.15)
Total Protein, Urine: 261 mg/dL

## 2022-02-19 LAB — GLUCOSE, CAPILLARY
Glucose-Capillary: 130 mg/dL — ABNORMAL HIGH (ref 70–99)
Glucose-Capillary: 131 mg/dL — ABNORMAL HIGH (ref 70–99)
Glucose-Capillary: 130 mg/dL — ABNORMAL HIGH (ref 70–99)
Glucose-Capillary: 90 mg/dL (ref 70–99)
Glucose-Capillary: 103 mg/dL — ABNORMAL HIGH (ref 70–99)
Glucose-Capillary: 147 mg/dL — ABNORMAL HIGH (ref 70–99)

## 2022-02-19 LAB — URINALYSIS, ROUTINE W REFLEX MICROSCOPIC
Bilirubin Urine: NEGATIVE
Glucose, UA: NEGATIVE mg/dL
Ketones, ur: NEGATIVE mg/dL
Leukocytes,Ua: NEGATIVE
Nitrite: NEGATIVE
Protein, ur: 30 mg/dL — AB
RBC / HPF: >50 RBC/hpf — ABNORMAL HIGH (ref 0–5)
Specific Gravity, Urine: 1.017 (ref 1.005–1.030)
pH: 5 (ref 5.0–8.0)

## 2022-02-19 LAB — BASIC METABOLIC PANEL
BUN: 64 mg/dL — ABNORMAL HIGH (ref 6–20)
CO2: 24 mmol/L (ref 22–32)
Calcium: 8.2 mg/dL — ABNORMAL LOW (ref 8.9–10.3)
Chloride: >130 mmol/L (ref 98–111)
Creatinine, Ser: 7.1 mg/dL — ABNORMAL HIGH (ref 0.61–1.24)
GFR, Estimated: 9 mL/min — ABNORMAL LOW (ref >60)
Glucose, Bld: 98 mg/dL (ref 70–99)
Potassium: 6.3 mmol/L (ref 3.5–5.1)
Sodium: 161 mmol/L (ref 135–145)

## 2022-02-19 LAB — SODIUM
Sodium: 154 mmol/L — ABNORMAL HIGH (ref 135–145)
Sodium: 157 mmol/L — ABNORMAL HIGH (ref 135–145)
Sodium: 161 mmol/L (ref 135–145)

## 2022-02-19 LAB — MAGNESIUM: Magnesium: 2.6 mg/dL — ABNORMAL HIGH (ref 1.7–2.4)

## 2022-02-19 LAB — TRIGLYCERIDES: Triglycerides: 423 mg/dL — ABNORMAL HIGH (ref <150)

## 2022-02-19 MED ORDER — PRISMASOL BGK 4/2.5 32-4-2.5 MEQ/L REPLACEMENT SOLN
Status: DC
  Filled 2022-02-19 (×3): qty 5000

## 2022-02-19 MED ORDER — SODIUM BICARBONATE 8.4 % IV SOLN
100.0000 meq | Freq: Once | INTRAVENOUS | Status: AC
  Administered 2022-02-19: 100 meq via INTRAVENOUS
  Filled 2022-02-19: qty 50
  Filled 2022-02-19: qty 100

## 2022-02-19 MED ORDER — STERILE WATER FOR INJECTION IV SOLN
INTRAVENOUS | Status: DC
  Filled 2022-02-19 (×9): qty 150
  Filled 2022-02-19: qty 100
  Filled 2022-02-19 (×16): qty 150

## 2022-02-19 MED ORDER — PRISMASOL BGK 4/2.5 32-4-2.5 MEQ/L EC SOLN
Status: DC
  Filled 2022-02-19 (×54): qty 5000

## 2022-02-19 MED ORDER — INSULIN ASPART 100 UNIT/ML IV SOLN
10.0000 [IU] | Freq: Once | INTRAVENOUS | Status: AC
  Administered 2022-02-19: 10 [IU] via INTRAVENOUS

## 2022-02-19 MED ORDER — DEXTROSE 50 % IV SOLN
1.0000 | Freq: Once | INTRAVENOUS | Status: AC
  Administered 2022-02-19: 50 mL via INTRAVENOUS
  Filled 2022-02-19: qty 50

## 2022-02-19 MED ORDER — LIOTHYRONINE SODIUM 5 MCG PO TABS
5.0000 ug | ORAL_TABLET | Freq: Every day | ORAL | Status: DC
  Administered 2022-02-19 – 2022-02-23 (×5): 5 ug
  Filled 2022-02-19 (×10): qty 1

## 2022-02-19 MED ORDER — STERILE WATER FOR INJECTION IV SOLN
INTRAVENOUS | Status: DC
  Filled 2022-02-19 (×24): qty 150

## 2022-02-19 MED ORDER — CARVEDILOL 3.125 MG PO TABS
6.2500 mg | ORAL_TABLET | Freq: Once | ORAL | Status: AC
  Administered 2022-02-19: 6.25 mg
  Filled 2022-02-19: qty 2

## 2022-02-19 MED ORDER — PRISMASOL BGK 4/2.5 32-4-2.5 MEQ/L REPLACEMENT SOLN
Status: DC
  Filled 2022-02-19 (×2): qty 5000

## 2022-02-19 MED ORDER — NICARDIPINE HCL IN NACL 20-0.86 MG/200ML-% IV SOLN
3.0000 mg/h | INTRAVENOUS | Status: DC
  Administered 2022-02-20: 5 mg/h via INTRAVENOUS
  Filled 2022-02-19: qty 200

## 2022-02-19 MED ORDER — FUROSEMIDE 10 MG/ML IJ SOLN
80.0000 mg | Freq: Once | INTRAMUSCULAR | Status: AC
  Administered 2022-02-19: 80 mg via INTRAVENOUS
  Filled 2022-02-19: qty 8

## 2022-02-19 MED ORDER — CALCIUM GLUCONATE 10 % IV SOLN: 1.0000 g | Freq: Once | INTRAVENOUS | Status: DC

## 2022-02-19 MED ORDER — SODIUM CHLORIDE 0.9 % FOR CRRT: INTRAVENOUS_CENTRAL | Status: DC | PRN

## 2022-02-19 MED ORDER — HEPARIN SODIUM (PORCINE) 1000 UNIT/ML DIALYSIS
1000.0000 [IU] | INTRAMUSCULAR | Status: DC | PRN
  Administered 2022-02-20 – 2022-02-24 (×2): 3000 [IU] via INTRAVENOUS_CENTRAL
  Filled 2022-02-19: qty 3
  Filled 2022-02-19 (×3): qty 6

## 2022-02-19 MED ORDER — ATROPINE SULFATE 1 MG/10ML IJ SOSY
PREFILLED_SYRINGE | INTRAMUSCULAR | Status: AC
  Filled 2022-02-19: qty 10

## 2022-02-19 MED ORDER — CARVEDILOL 12.5 MG PO TABS
12.5000 mg | ORAL_TABLET | Freq: Two times a day (BID) | ORAL | Status: DC
  Administered 2022-02-19: 12.5 mg
  Filled 2022-02-19 (×2): qty 1

## 2022-02-19 MED ORDER — PROSOURCE TF PO LIQD
45.0000 mL | Freq: Three times a day (TID) | ORAL | Status: DC
  Administered 2022-02-19 – 2022-02-23 (×15): 45 mL
  Filled 2022-02-19 (×16): qty 45

## 2022-02-19 MED ORDER — CALCIUM GLUCONATE-NACL 1-0.675 GM/50ML-% IV SOLN
1.0000 g | Freq: Once | INTRAVENOUS | Status: AC
  Administered 2022-02-19: 1000 mg via INTRAVENOUS
  Filled 2022-02-19: qty 50

## 2022-02-19 MED ORDER — PIVOT 1.5 CAL PO LIQD
1000.0000 mL | ORAL | Status: DC
  Administered 2022-02-19 – 2022-02-23 (×6): 1000 mL

## 2022-02-19 MED ORDER — SODIUM ZIRCONIUM CYCLOSILICATE 10 G PO PACK
10.0000 g | PACK | Freq: Once | ORAL | Status: AC
Start: 2022-02-19 — End: 2022-02-19
  Administered 2022-02-19: 10 g
  Filled 2022-02-19: qty 1

## 2022-02-19 MED ORDER — HYDRALAZINE HCL 50 MG PO TABS
100.0000 mg | ORAL_TABLET | Freq: Four times a day (QID) | ORAL | Status: DC
  Administered 2022-02-19 – 2022-02-23 (×17): 100 mg
  Filled 2022-02-19 (×17): qty 2

## 2022-02-19 NOTE — Progress Notes (Addendum)
Initial Nutrition Assessment ? ?DOCUMENTATION CODES:  ? ?Morbid obesity ? ?INTERVENTION:  ? ?Initiate tube feeding via cortrak:  ? ?Pivot 1.5 at 25 ml/h and increase by 10 ml every 8 hours to goal rate of 65 ml/h (1560 ml per day) ?Prosource TF 45 ml TID ? ?Provides 2460 kcal, 179 gm protein, 1184 ml free water daily ? ?Cleviprex providing additional kcal  ? ?NUTRITION DIAGNOSIS:  ? ?Increased nutrient needs related to  (COVID) as evidenced by estimated needs. ? ?GOAL:  ? ?Patient will meet greater than or equal to 90% of their needs ? ?MONITOR:  ? ?TF tolerance ? ?REASON FOR ASSESSMENT:  ? ?Consult, Ventilator ?Enteral/tube feeding initiation and management ? ?ASSESSMENT:  ? ?Pt with PMH of uncontrolled HTN admitted with acute R hemiparesis and found to have IPH, incidentally positive with COVID, now with COVID PNA.  ? ?Pt discussed during ICU rounds and with RN. Pt NPO or had minimal intake due to BiPAP use this admission (5 days)  ?Pt starting CRRT per discussion with MD.  ? ?4/29 - admitted with IPH ?5/4 - intubated for acute respiratory failure with hypoxia and hypercapnia due to ARDS 2/2 COVID PNA, RLL CAP; failed BiPAP and HFNC ? ?Admission wt: 161.6 kg ?Current wt: 170.7 kg ?+ 6 L ?Moderate edema  ? ?Patient is currently intubated on ventilator support ?Temp (24hrs), Avg:99.7 ?F (37.6 ?C), Min:99.2 ?F (37.3 ?C), Max:100.4 ?F (38 ?C) ? ?Medications reviewed and include: Vitamin C 500 mg daily, colace, SSI, solumedrol, protonix, miralax, zinc sulfate 220 mg daily (5/5) ?Cleviprex @ 28 ml/hr provides: 1344 kcal  ?Fentanyl  ?Versed ? ?Labs reviewed: K 6.6, PO4 9.6 ?TG: 426 ?CRP: 9.7 (5/4) ?A1C: 5.5 ? ?CBG's: 114-131  ? ?UOP: 1120 ml  ? ?NUTRITION - FOCUSED PHYSICAL EXAM: ? ?Flowsheet Row Most Recent Value  ?Orbital Region No depletion  ?Upper Arm Region No depletion  ?Thoracic and Lumbar Region No depletion  ?Buccal Region No depletion  ?Temple Region No depletion  ?Clavicle Bone Region No depletion  ?Clavicle  and Acromion Bone Region No depletion  ?Scapular Bone Region No depletion  ?Dorsal Hand No depletion  ?Patellar Region No depletion  ?Anterior Thigh Region No depletion  ?Posterior Calf Region No depletion  ?Edema (RD Assessment) Moderate  ?Hair Reviewed  ?Eyes Unable to assess  ?Mouth Unable to assess  ?Skin Reviewed  ?Nails Reviewed  ? ?  ? ? ?Diet Order:   ?Diet Order   ? ?       ?  Diet NPO time specified  Diet effective now       ?  ? ?  ?  ? ?  ? ? ?EDUCATION NEEDS:  ? ?Not appropriate for education at this time ? ?Skin:  Skin Assessment: Reviewed RN Assessment ? ?Last BM:  5/4 ? ?Height:  ? ?Ht Readings from Last 1 Encounters:  ?02/17/22 5\' 9"  (1.753 m)  ? ? ?Weight:  ? ?Wt Readings from Last 1 Encounters:  ?02/19/22 (!) 170.7 kg  ? ? ?Ideal Body Weight:  72.7 kg  ? ?BMI:  Body mass index is 55.57 kg/m?. ? ?Estimated Nutritional Needs:  ? ?Kcal:  2200-2600 ? ?Protein:  160-185 grams ? ?Fluid:  >2 L/day ? ?12-17-1978., RD, LDN, CNSC ?See AMiON for contact information  ? ?

## 2022-02-19 NOTE — Progress Notes (Signed)
STROKE TEAM PROGRESS NOTE  ? ?INTERVAL HISTORY ?Wife and mother at the bedside.  Patient still intubated on sedation.  Still has respiratory acidosis, CCM on board.  Also developed severe renal failure, nephrology on board, on CRRT. Na 154, on sodium bicarb. ? ?Vitals:  ? 02/19/22 1922 02/19/22 1934 02/19/22 2000 02/19/22 2100  ?BP:      ?Pulse:   (!) 56 (!) 55  ?Resp:   (!) 29 (!) 35  ?Temp:  (!) 97.5 ?F (36.4 ?C)    ?TempSrc:  Axillary    ?SpO2: 98%  98% 98%  ?Weight:      ?Height:      ? ?CBC:  ?Recent Labs  ?Lab 02/19/22 ?0230 02/19/22 ?0518 02/19/22 ?1152 02/19/22 ?1529 02/19/22 ?1932  ?WBC 11.0* 11.6*  --   --   --   ?NEUTROABS 9.4* 9.5*  --   --   --   ?HGB 11.3* 11.2*   < > 11.6* 11.2*  ?HCT 39.8 40.7   < > 34.0* 33.0*  ?MCV 88.6 89.6  --   --   --   ?PLT 221 208  --   --   --   ? < > = values in this interval not displayed.  ? ?Basic Metabolic Panel:  ?Recent Labs  ?Lab 02/15/22 ?0452 02/15/22 ?0953 02/19/22 ?0518 02/19/22 ?1140 02/19/22 ?1152 02/19/22 ?1616 02/19/22 ?1932  ?NA 148*   < > 159* 161*   < > 157*  157* 154*  ?K 3.9   < > 6.6* 6.3*   < > 5.6* 5.8*  ?CL 120*   < > 128* >130*  --  125*  --   ?CO2 21*   < > 23 24  --  24  --   ?GLUCOSE 113*   < > 136* 98  --  103*  --   ?BUN 28*   < > 59* 64*  --  55*  --   ?CREATININE 3.37*   < > 6.28* 7.10*  --  6.24*  --   ?CALCIUM 8.6*   < > 8.2* 8.2*  --  8.1*  --   ?MG 2.3  --  2.6*  --   --   --   --   ?PHOS  --   --  9.6*  --   --  8.0*  --   ? < > = values in this interval not displayed.  ? ?Lipid Panel:  ?Recent Labs  ?Lab 02/14/22 ?0352 02/15/22 ?0452 02/19/22 ?0518  ?CHOL 192  --   --   ?TRIG 309*   < > 423*  ?HDL 20*  --   --   ?CHOLHDL 9.6  --   --   ?VLDL 62*  --   --   ?LDLCALC 110*  --   --   ? < > = values in this interval not displayed.  ? ?HgbA1c:  ?Recent Labs  ?Lab 02/11/2022 ?1751  ?HGBA1C 5.5  ? ?Urine Drug Screen:  ?Recent Labs  ?Lab 01/25/2022 ?1601  ?LABOPIA NONE DETECTED  ?COCAINSCRNUR NONE DETECTED  ?LABBENZ NONE DETECTED  ?AMPHETMU NONE  DETECTED  ?THCU NONE DETECTED  ?LABBARB NONE DETECTED  ?  ?Alcohol Level  ?Recent Labs  ?Lab 01/31/2022 ?1242  ?ETH <10  ? ? ?IMAGING past 24 hours ?DG Abd 1 View ? ?Result Date: 02/19/2022 ?CLINICAL DATA:  Nasogastric tube placement EXAM: ABDOMEN - 1 VIEW COMPARISON:  Portable exam 1130 hours compared to 02/18/2022 FINDINGS: Interval removal of nasogastric tube and  placement of feeding tube. Tip of feeding tube projects over proximal to mid stomach. Nonobstructive bowel gas pattern. IMPRESSION: Tip of feeding tube projects over proximal to mid stomach. Electronically Signed   By: Ulyses Southward M.D.   On: 02/19/2022 11:41  ? ?DG Chest Port 1 View ? ?Result Date: 02/19/2022 ?CLINICAL DATA:  Intubation EXAM: PORTABLE CHEST 1 VIEW COMPARISON:  Portable exam 1127 hours compared to 02/18/2022 FINDINGS: Tip of endotracheal tube 8.4 cm above carina. Feeding tube extends into abdomen. RIGHT jugular catheter tip projects over SVC. Borderline enlargement of cardiac silhouette. Mediastinal contours normal. Hazy infiltrates mid to lower LEFT lung. No pleural effusion or pneumothorax. IMPRESSION: Hazy infiltrates mid to lower LEFT lung. Electronically Signed   By: Ulyses Southward M.D.   On: 02/19/2022 11:40   ? ?PHYSICAL EXAM ? ?Temp:  [97.5 ?F (36.4 ?C)-100.4 ?F (38 ?C)] 97.5 ?F (36.4 ?C) (05/05 1934) ?Pulse Rate:  [44-85] 55 (05/05 2100) ?Resp:  [15-46] 35 (05/05 2100) ?BP: (131-160)/(52-94) 131/52 (05/05 1143) ?SpO2:  [96 %-99 %] 98 % (05/05 2100) ?Arterial Line BP: (75-170)/(38-73) 131/54 (05/05 2100) ?FiO2 (%):  [60 %-100 %] 60 % (05/05 2000) ?Weight:  [170.7 kg] 170.7 kg (05/05 0500) ? ?General - Well nourished, well developed, intubated on sedation. ? ?Ophthalmologic - fundi not visualized due to noncooperation. ? ?Cardiovascular - Regular rate and rhythm. ? ?Neuro - intubated on sedation, eyes closed, not following commands. With forced eye opening, eyes in need position, not blinking to visual threat, not tracking, pupil equal  size, sluggish to light. Corneal reflex weakly present, gag and cough present. Breathing over the vent.  Facial symmetry not able to test due to ET tube.  Tongue protrusion not cooperative. On pain stimulation, no significant movement in all extremities. Sensation, coordination and gait not tested. ? ? ?ASSESSMENT/PLAN ?[name] is a 36 y.o. male with history of HTN, HLD, obesity, smoking, OSA on CPAP and mood disorder presenting with acute right hemiplegia with right sided numbness.  He was taken to [facility], was found to have an ICH and was transferred here for further management.  ICH score is 1.  Head CT last night reveals increasing left to right midline shift to 77mm without hydrocephalus.  3% HTS is off now due to Na 160.  Patient has confirmed covid-19 infection.  His wife reports that he smokes cigarettes and is not always compliant with his medical therapy at home. ? ?ICH:  left large ICH involving corona radiata, lentiform nucleus and subinsular white matter with 60mm left to right midline shift  ?CT head IPH involving left corona radiata, left lentiform nucleus and subinsular white matter with regional mass effect and 25mm midline shift ?CTA Head and Neck Nondiagnostic exam ?Follow up CT 4/29 2112 unchanged size of left ICH with increased edema and 54mm left to right midline shift ?CT head 4/30 unchanged hematoma size, midline shift 7 mm ?CT repeat 5/1 stable hematoma, midline shift 5 mm ?CT repeat 5/4 stable IPH with 39mm midline shift ?May consider MRI and MRA once stable ?2D Echo EF 60-65%, interatrial septum not well visualized ?LDL 110 ?HgbA1c 5.5 ?VTE prophylaxis - SCDs ?No antithrombotic prior to admission, now on No antithrombotic secondary to ICH ?Therapy recommendations:  CIR ?Disposition:  pending ? ?Cerebral Edema (brain compression) ?Cerebral edema present from ICH with 15mm midline shift ?3% HTS at 75 cc/hr -> NS @ 50-> off ?CT Head 4/30 unchanged hematoma, midline shift 7 mm ?CT  head 5/1 midline shift  5mm ?CT head 5/4 midline shift 6mm ?Na goal 150-155 ?Na 148->151->152->159-> 162->157->154 ?Na monitoring Q6h ? ?Hypertensive emergency ?Home meds:  losartan-HCTZ 100-12.5 daily, amlodipine 10 mg daily ?Off cleviprex drip ?Keep SBP < 160 ?On amlodipine 10 and coreg, hydralazine ?Long-term BP goal normotensive ? ?Worsening AKI with ATN ?Cre 2.59->2.72->2.90->3.37->3.56-> 3.72-> 3.96->6.28->7.10 ?Nephro on board, appreciate recommendations ?Considering ATN ?Now on CRRT ? ?Covid-19 infection with respiratory failure ?Patient is positive for covid-19 infection ?Was on BiPAP -> intubated ?CCM on board ?On decadron IV->solumedrol ?Per report, pt refused remedesivir and paxlovid  ?Ventilator management by CCM ?On rocephine ?Tmax 101.4->afebrile ?Leukocytosis WBC 15.2-12.4->12.9->11.5-> 11.3-> 8.4->11.6 ? ?Hyperlipidemia ?Home meds:  none ?LDL 110, goal < 70 ?Hold off statin now given ICH ?Add statin at discharge ? ?Tobacco abuse ?Current smoker ?Smoking cessation counseling provided ?Nicotine patch provided ?Pt is willing to quit ? ?Other Stroke Risk Factors ?Obesity, Body mass index is 55.57 kg/m?., BMI >/= 30 associated with increased stroke risk, recommend weight loss, diet and exercise as appropriate  ?Obstructive sleep apnea, on CPAP at home ? ?Other Active Problems ?Mood disorder ? ?Hospital day # 6 ? ? ?Marvel PlanJindong Daleisa Halperin, MD PhD ?Stroke Neurology ?02/19/2022 ?9:14 PM ? ?This patient is critically ill due to left BG large ICH, cerebral edema, COVID infection, respiratory failure, renal failure and at significant risk of neurological worsening, death form hematoma expansion, brain herniation, seizure, complication from COVID and respiratory failure. This patient's care requires constant monitoring of vital signs, hemodynamics, respiratory and cardiac monitoring, review of multiple databases, neurological assessment, discussion with family, other specialists and medical decision making of high complexity.  I spent 35 minutes of neurocritical care time in the care of this patient.  I discussed with Dr. Katrinka BlazingSmith CCM. I had long discussion with wife and mom at bedside, updated pt current condition, treatment plan an

## 2022-02-19 NOTE — Progress Notes (Signed)
Elink called regarding K+ of 6.6.  ?

## 2022-02-19 NOTE — Procedures (Addendum)
Central Venous Catheter Insertion Procedure Note ? ?Clinton Taylor  ?[id number]  ?March 20, 1986 ? ?Date:02/19/22  ?Time:5:16 PM  ? ?Provider Performing:Darnell Stimson Cipriano Mile supervising Misha ferolito ? ?Procedure: Insertion of Non-tunneled Central Venous Catheter(36556) with US guidance [id number])  ? ?Indication(s) ?Dialysis ? ?Consent ?Risks of the procedure as well as the alternatives and risks of each were explained to the patient and/or caregiver.  Consent for the procedure was obtained and is signed in the bedside chart ? ?Anesthesia ?Topical only with 1% lidocaine  ? ?Timeout ?Verified patient identification, verified procedure, site/side was marked, verified correct patient position, special equipment/implants available, medications/allergies/relevant history reviewed, required imaging and test results available. ? ?Sterile Technique ?Maximal sterile technique including full sterile barrier drape, hand hygiene, sterile gown, sterile gloves, mask, hair covering, sterile ultrasound probe cover (if used). ? ?Procedure Description ?Area of catheter insertion was cleaned with chlorhexidine and draped in sterile fashion.  With real-time ultrasound guidance a HD catheter was placed into the right internal jugular vein. Nonpulsatile blood flow and easy flushing noted in all ports.  The catheter was sutured in place and sterile dressing applied. ? ?Complications/Tolerance ?None; patient tolerated the procedure well. ?Chest X-ray is ordered to verify placement for internal jugular or subclavian cannulation.   Chest x-ray is not ordered for femoral cannulation. ? ?EBL ?Minimal ? ?Specimen(s) ?None ? ?

## 2022-02-19 NOTE — Procedures (Signed)
Cortrak  Person Inserting Tube:  Aubriel Khanna D, RD Tube Type:  Cortrak - 43 inches Tube Size:  10 Tube Location:  Left nare Secured by: Bridle Technique Used to Measure Tube Placement:  Marking at nare/corner of mouth Cortrak Secured At:  80 cm  Cortrak Tube Team Note:  Consult received to place a Cortrak feeding tube.   X-ray is required, abdominal x-ray has been ordered by the Cortrak team. Please confirm tube placement before using the Cortrak tube.   If the tube becomes dislodged please keep the tube and contact the Cortrak team at [email or web address] (password [email or web address]) for replacement.  If after hours and replacement cannot be delayed, place a NG tube and confirm placement with an abdominal x-ray.    Nikolaus Pienta, RD, LDN Clinical Dietitian RD pager # available in AMION  After hours/weekend pager # available in AMION   

## 2022-02-19 NOTE — Progress Notes (Signed)
Patient ID: Clinton Taylor, male   DOB: 1985/12/10, 36 y.o.   MRN: [id number] ?[facility] ?Progress Note  ? ?Assessment/ Plan:   ?1.  Acute kidney injury on chronic kidney disease stage IIIb (labs from last month showed creatinine 2.39): Based on the history and timeline of events, this appears to be hemodynamically mediated with recent contrast exposure and fluctuations in blood pressure likely resulting in ATN.  With worsening renal function overnight likely associated with worsening clinical status.  In light of decreasing urine output and hyperkalemia, recommend starting on renal replacement therapy-CRRT would be ideal for better electrolyte regulation. ?2.  Hypernatremia: Medically induced with hypertonic saline to mitigate risk from cerebral edema.  He was transitioned from hypertonic saline to normal saline overnight with most recent sodium level of 161 and today I will switch him to D5 1/2NS with monitoring of sodium levels to keep this around 150. ?3.  Hypertensive emergency with acute left-sided intracerebral hemorrhage: With right-sided hemiparesis at this time with ongoing efforts at blood pressure control at this time. ?4.  Acute hypoxic respiratory failure: On noninvasive positive pressure ventilation along with Decadron treatment for COVID-19 infection and community-acquired pneumonia on ceftriaxone and azithromycin. ?5.  Metabolic acidosis: Mild and likely associated with acute kidney injury, will continue to follow labs. ? ?Subjective:   ?Significant acute clinical changes overnight, intubated for worsening respiratory failure and now getting prone ventilation.  Furosemide attempted with minimal urine output  ? ?Objective:   ?BP (!) 160/94   Pulse 68   Temp 99.2 ?F (37.3 ?C) (Axillary)   Resp (!) 35   Ht 5' 9" (1.753 m)   Wt (!) 170.7 kg   SpO2 99%   BMI 55.57 kg/m?  ? ?Intake/Output Summary (Last 24 hours) at 02/19/2022 0837 ?Last data filed at 02/19/2022 0600 ?Gross per 24 hour   ?Intake 2494.14 ml  ?Output 1120 ml  ?Net 1374.14 ml  ? ?Weight change: -2.9 kg ? ?Physical Exam: ?Gen: In prone position, on ventilator.  Wife and mother at bedside ?CVS: Pulse regular rhythm, normal rate, S1 and S2 normal ?Resp: Clear to auscultation bilaterally (posterior chest), no distinct rales or rhonchi.  On ventilator ?Abd: Soft, obese, nontender, bowel sounds normal ?Ext: 1+ bilateral lower extremity edema, right-sided hemiparesis ? ?Imaging: ?CT HEAD WO CONTRAST (5MM) ? ?Result Date: 02/18/2022 ?CLINICAL DATA:  Stroke, hemorrhagic Increased dysarthria EXAM: CT HEAD WITHOUT CONTRAST TECHNIQUE: Contiguous axial images were obtained from the base of the skull through the vertex without intravenous contrast. RADIATION DOSE REDUCTION: This exam was performed according to the departmental dose-optimization program which includes automated exposure control, adjustment of the mA and/or kV according to patient size and/or use of iterative reconstruction technique. COMPARISON:  None Available. FINDINGS: Brain: Similar size of an intraparenchymal hemorrhage centered in left basal ganglia. Slight increase in surrounding edema. Mass effect is similar with approximately 6 mm of rightward midline shift. Similar effacement of the left lateral ventricle. No progressive ventriculomegaly. No evidence of acute large vascular territory infarct on motion limited assessment. Vascular: No hyperdense vessel is identified. Skull: No acute fracture. Sinuses/Orbits: Left maxillary sinus retention cyst. Otherwise, clear sinuses. Other: No mastoid effusions. IMPRESSION: Similar size of an intraparenchymal hemorrhage centered in the left basal ganglia with slight increase in surrounding edema. Similar mass effect with 6 mm of rightward midline shift. Electronically Signed   By: [name]   On: 02/18/2022 08:53  ? ?DG Chest Port 1 View ? ?Result Date: 02/18/2022 ?CLINICAL DATA:  Endotracheal tube. EXAM: PORTABLE CHEST 1 VIEW  COMPARISON:  Chest x-ray 02/18/2022. FINDINGS: Examination is technically limited secondary to patient positioning. Endotracheal tube tip is 4 cm above the carina. There is a left-sided central venous catheter with distal tip projecting over the brachiocephalic SVC junction, unchanged. Enteric tube is only partially visualized. Cardiomediastinal silhouette is enlarged unchanged. There are increasing central airspace opacities. Right costophrenic angle and right lower lung have been excluded. Left costophrenic angle clear. No pneumothorax or acute fracture. IMPRESSION: 1. Cardiomegaly with increasing central opacities worrisome for pulmonary edema. Infection not excluded. Electronically Signed   By: Ronney Asters M.D.   On: 02/18/2022 19:43  ? ?DG CHEST PORT 1 VIEW ? ?Result Date: 02/18/2022 ?CLINICAL DATA:  Endotracheal tube and central line placement. EXAM: PORTABLE CHEST 1 VIEW COMPARISON:  02/15/2022 FINDINGS: Endotracheal tube is in place, tip approximately 7.5 centimeters above the carina. Nasogastric tube has been placed, tip beyond the edge of the image, at least to level of the distal esophagus. A LEFT IJ central line tip overlies the confluence of the superior vena cava with the LEFT brachiocephalic vein. The heart is enlarged. There are diffuse pulmonary infiltrates bilaterally, increased compared to prior study. Lung bases are partially excluded. There is no pneumothorax. IMPRESSION: Interval placement of endotracheal tube, nasogastric tube and LEFT central line. No pneumothorax. Increased bilateral pulmonary infiltrates. Stable cardiac Electronically Signed   By: Nolon Nations M.D.   On: 02/18/2022 13:20  ? ?DG Abd Portable 1V ? ?Result Date: 02/18/2022 ?CLINICAL DATA:  Gastrointestinal tube. EXAM: PORTABLE ABDOMEN - 1 VIEW COMPARISON:  None Available. FINDINGS: Enteric tube tip is coiled in the body of the stomach. No dilated bowel loops are seen on this image. IMPRESSION: 1. Enteric tube tip is coiled  in the body of the stomach. Electronically Signed   By: Ronney Asters M.D.   On: 02/18/2022 19:43   ? ?Labs: ?BMET ?Recent Labs  ?Lab 01/21/2022 ?2042 01/23/2022 ?2226 02/14/22 ?3846 02/14/22 ?6599 02/15/22 ?3570 02/15/22 ?1779 02/16/22 ?3903 02/16/22 ?0092 02/17/22 ?0433 02/17/22 ?1156 02/18/22 ?0411 02/18/22 ?1050 02/18/22 ?1246 02/18/22 ?1823 02/18/22 ?1829 02/18/22 ?1838 02/18/22 ?2237 02/18/22 ?2344 02/19/22 ?0518  ?NA 137   < > 138   < > 148*   < > 154*   < > 161*   < > 160*   < > 165* 164* 163* 161* 162* 161* 159*  ?K 3.4*  --  3.1*  --  3.9  --  4.1  --  3.9  --  4.4  --  4.6 4.8 4.8  --  5.5*  --  6.6*  ?CL 102  --  108  --  120*  --  126*  --  >130*  --  >130*  --   --   --   --   --   --   --  128*  ?CO2 25  --  23  --  21*  --  19*  --  18*  --  18*  --   --   --   --   --   --   --  23  ?GLUCOSE 119*  --  134*  --  113*  --  106*  --  99  --  130*  --   --   --   --   --   --   --  136*  ?BUN 25*  --  26*  --  28*  --  34*  --  37*  --  37*  --   --   --   --   --   --   --  59*  ?CREATININE 2.72*  --  2.90*  --  3.37*  --  3.56*  --  3.72*  --  3.96*  --   --   --   --   --   --   --  6.28*  ?CALCIUM 8.8*  --  8.4*  --  8.6*  --  8.6*  --  8.9  --  8.9  --   --   --   --   --   --   --  8.2*  ?PHOS  --   --   --   --   --   --   --   --   --   --   --   --   --   --   --   --   --   --  9.6*  ? < > = values in this interval not displayed.  ? ?CBC ?Recent Labs  ?Lab 02/17/22 ?0433 02/18/22 ?0411 02/18/22 ?1246 02/18/22 ?1829 02/18/22 ?2237 02/19/22 ?0230 02/19/22 ?0518  ?WBC 11.3* 8.4  --   --   --  11.0* 11.6*  ?NEUTROABS 8.9* 7.2  --   --   --  9.4* 9.5*  ?HGB 11.5* 11.8*   < > 12.6* 11.2* 11.3* 11.2*  ?HCT 37.0* 38.5*   < > 37.0* 33.0* 39.8 40.7  ?MCV 81.3 81.7  --   --   --  88.6 89.6  ?PLT 256 236  --   --   --  221 208  ? < > = values in this interval not displayed.  ? ? ?Medications:   ? ? amLODipine  10 mg Per Tube Daily  ? ARIPiprazole  10 mg Per Tube Daily  ? vitamin C  500 mg Per Tube Daily  ?  carvedilol  3.125 mg Per Tube BID WC  ? chlorhexidine gluconate (MEDLINE KIT)  15 mL Mouth Rinse BID  ? Chlorhexidine Gluconate Cloth  6 each Topical Q0600  ? citalopram  40 mg Per Tube Daily  ? docusate  100 m

## 2022-02-19 NOTE — Progress Notes (Signed)
OT Cancellation Note ? ?Patient Details ?Name: Clinton Taylor ?MRN: [id number] ?DOB: 1986/06/03 ? ? ?Cancelled Treatment:    Reason Eval/Treat Not Completed: Patient not medically ready (sedated on vent with high peep; will sign off at this time. Please reconsult once pt medically appropriate. thanks) ? ?Arthuro Canelo,HILLARY ?02/19/2022, 9:17 AM ?Maurie Boettcher, OT/L  ? ?Acute OT Clinical Specialist ?Acute Rehabilitation Services ?Pager [phone] ?Office [phone]  ?

## 2022-02-19 NOTE — Progress Notes (Signed)
PT Cancellation Note ? ?Patient Details ?Name: Clinton Taylor ?MRN: [id number] ?DOB: September 30, 1986 ? ? ?Cancelled Treatment:    Reason Eval/Treat Not Completed: Medical issues which prohibited therapy; patient on vent, proning and possible to start CRRT.  Will sign off for now, please re-consult when stable.  ? ? ?Elray Mcgregor ?02/19/2022, 9:21 AM ?Sheran Lawless, PT ?Acute Rehabilitation Services ?Pager:[phone] ?Office:[phone] ?02/19/2022 ? ?
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
## 2022-02-19 NOTE — Progress Notes (Signed)
eLink Physician-Brief Progress Note ?Patient Name: Clinton Taylor ?DOB: 03-07-1986 ?MRN: [id number] ? ? ?Date of Service ? 02/19/2022  ?HPI/Events of Note ? Multiple issues: 1. K+ = 6.6 and 2. Triglycerides = 423. Likely d/t prolonged Cleviprex IV infusion.   ?eICU Interventions ? Plan: ?Increase Hydralazine to 100 mg per tube Q 6 hours.  ?Please give ordered Labetalol IV. ?Wean Cleviprex IV infusion off as tolerated.  ?NaHCO3 100 meq IV now. ?Calcium gluconate 1 gm IV now. ?D50 1 amp IV now. ?Novolog Insulin 10 units IV now. ?Lokelma 10 gm per tube X 1 now. ?Repeat BMP at 12 Noon.   ? ? ? ?Intervention Category ?Major Interventions: Electrolyte abnormality - evaluation and management;Other: ? ?[name] ?02/19/2022, 6:45 AM ?

## 2022-02-19 NOTE — Progress Notes (Addendum)
eLink Physician-Brief Progress Note ?Patient Name: Clinton Taylor ?DOB: August 01, 1986 ?MRN: [id number] ? ? ?Date of Service ? 02/19/2022  ?HPI/Events of Note ? ABG on 60%/PRVC 18/TV 420/P 18 = 7.18/71.8/226/27.6. CRRT just restarted with pre and post filter NaHCO3 IV infusions.  ?eICU Interventions ? Plan: ?Continue present management. ?Repeat ABG at 12 midnight.  ? ? ? ?Intervention Category ?Major Interventions: Respiratory failure - evaluation and management;Acid-Base disturbance - evaluation and management ? ?Clinton Taylor ?02/19/2022, 7:54 PM ?

## 2022-02-19 NOTE — Progress Notes (Signed)
eLink Physician-Brief Progress Note ?Patient Name: Clinton Taylor ?DOB: 1985/12/22 ?MRN: [id number] ? ? ?Date of Service ? 02/19/2022  ?HPI/Events of Note ? CVP = 22. Na+ = 161. Will diurese in an attempt to improve ventilation. Will need to watch Na+ closely.   ?eICU Interventions ? Plan: ?D/C NaHCO3 IV infusion.  ?D/C NaCl IV infusion. ?Lasix 80 mg IV X 1 now. ?May benefit from renal replacement therapy. Defer to Nephrology.   ? ? ? ?Intervention Category ?Major Interventions: Other: ? ?[name] ?02/19/2022, 3:20 AM ?

## 2022-02-19 NOTE — Progress Notes (Signed)
Critical ABG values called to E-link RN.  

## 2022-02-19 NOTE — Progress Notes (Signed)
eLink Physician-Brief Progress Note ?Patient Name: Clinton Taylor ?DOB: Nov 22, 1985 ?MRN: [id number] ? ? ?Date of Service ? 02/19/2022  ?HPI/Events of Note ? Multiple issues: 1. Na+ unchanged at 161. 2. Oliguria - Urine output only 50 mL for the shift. Creatinine = 6.96.  ?eICU Interventions ? Plan: ?Monitor CVP now and Q 4 hours.  ? ? ? ?Intervention Category ?Major Interventions: Other: ? ?Jerry Haugen Dennard Nip ?02/19/2022, 2:03 AM ?

## 2022-02-19 NOTE — Progress Notes (Signed)
SLP Cancellation Note ? ?Patient Details ?Name: Amante Kosek ?MRN: [id number] ?DOB: [birth year] ? ? ?Cancelled treatment:   PT sedated, on vent with high PEEP.  Will sign off. Please reconsult wheh appropriate. ? ?Corran Lalone L. Tacori Kvamme, MA CCC/SLP ?Acute Rehabilitation Services ?Office number [phone] ?Pager [phone] ?      ? ? ?Juan Quam Laurice ?02/19/2022, 10:30 AM ?

## 2022-02-20 ENCOUNTER — Encounter (HOSPITAL_COMMUNITY): Admission: EM | Disposition: E | Payer: Self-pay | Source: Home / Self Care | Attending: Neurology

## 2022-02-20 ENCOUNTER — Inpatient Hospital Stay (HOSPITAL_COMMUNITY): Payer: BC Managed Care – PPO

## 2022-02-20 ENCOUNTER — Other Ambulatory Visit: Payer: Self-pay

## 2022-02-20 ENCOUNTER — Inpatient Hospital Stay (HOSPITAL_COMMUNITY): Payer: BC Managed Care – PPO | Admitting: Anesthesiology

## 2022-02-20 DIAGNOSIS — N170 Acute kidney failure with tubular necrosis: Secondary | ICD-10-CM

## 2022-02-20 DIAGNOSIS — I610 Nontraumatic intracerebral hemorrhage in hemisphere, subcortical: Secondary | ICD-10-CM | POA: Diagnosis not present

## 2022-02-20 HISTORY — PX: CRANIOTOMY: SHX93

## 2022-02-20 LAB — MAGNESIUM
Magnesium: 2.4 mg/dL (ref 1.7–2.4)
Magnesium: 2.4 mg/dL (ref 1.7–2.4)

## 2022-02-20 LAB — RENAL FUNCTION PANEL
Albumin: 2.8 g/dL — ABNORMAL LOW (ref 3.5–5.0)
Albumin: 2.7 g/dL — ABNORMAL LOW (ref 3.5–5.0)
Anion gap: 11 (ref 5–15)
Anion gap: 12 (ref 5–15)
BUN: 58 mg/dL — ABNORMAL HIGH (ref 6–20)
BUN: 60 mg/dL — ABNORMAL HIGH (ref 6–20)
CO2: 32 mmol/L (ref 22–32)
CO2: 36 mmol/L — ABNORMAL HIGH (ref 22–32)
Calcium: 7.9 mg/dL — ABNORMAL LOW (ref 8.9–10.3)
Calcium: 7.8 mg/dL — ABNORMAL LOW (ref 8.9–10.3)
Chloride: 107 mmol/L (ref 98–111)
Chloride: 97 mmol/L — ABNORMAL LOW (ref 98–111)
Creatinine, Ser: 5.49 mg/dL — ABNORMAL HIGH (ref 0.61–1.24)
Creatinine, Ser: 5.18 mg/dL — ABNORMAL HIGH (ref 0.61–1.24)
GFR, Estimated: 13 mL/min — ABNORMAL LOW (ref >60)
GFR, Estimated: 14 mL/min — ABNORMAL LOW (ref >60)
Glucose, Bld: 138 mg/dL — ABNORMAL HIGH (ref 70–99)
Glucose, Bld: 157 mg/dL — ABNORMAL HIGH (ref 70–99)
Phosphorus: 7.9 mg/dL — ABNORMAL HIGH (ref 2.5–4.6)
Phosphorus: 8.1 mg/dL — ABNORMAL HIGH (ref 2.5–4.6)
Potassium: 4.8 mmol/L (ref 3.5–5.1)
Potassium: 3.8 mmol/L (ref 3.5–5.1)
Sodium: 150 mmol/L — ABNORMAL HIGH (ref 135–145)
Sodium: 145 mmol/L (ref 135–145)

## 2022-02-20 LAB — POCT I-STAT 7, (LYTES, BLD GAS, ICA,H+H)
Acid-Base Excess: 8 mmol/L — ABNORMAL HIGH (ref 0.0–2.0)
Bicarbonate: 37.1 mmol/L — ABNORMAL HIGH (ref 20.0–28.0)
Calcium, Ion: 1.11 mmol/L — ABNORMAL LOW (ref 1.15–1.40)
HCT: 33 % — ABNORMAL LOW (ref 39.0–52.0)
Hemoglobin: 11.2 g/dL — ABNORMAL LOW (ref 13.0–17.0)
O2 Saturation: 100 %
Patient temperature: 34.6
Potassium: 4.9 mmol/L (ref 3.5–5.1)
Sodium: 150 mmol/L — ABNORMAL HIGH (ref 135–145)
TCO2: 40 mmol/L — ABNORMAL HIGH (ref 22–32)
pCO2 arterial: 74 mmHg (ref 32–48)
pH, Arterial: 7.296 — ABNORMAL LOW (ref 7.35–7.45)
pO2, Arterial: 247 mmHg — ABNORMAL HIGH (ref 83–108)

## 2022-02-20 LAB — PHOSPHORUS: Phosphorus: 8 mg/dL — ABNORMAL HIGH (ref 2.5–4.6)

## 2022-02-20 LAB — SODIUM
Sodium: 148 mmol/L — ABNORMAL HIGH (ref 135–145)
Sodium: 145 mmol/L (ref 135–145)
Sodium: 148 mmol/L — ABNORMAL HIGH (ref 135–145)

## 2022-02-20 LAB — CULTURE, RESPIRATORY W GRAM STAIN
Culture: NORMAL
Gram Stain: NONE SEEN

## 2022-02-20 LAB — GLUCOSE, CAPILLARY
Glucose-Capillary: 120 mg/dL — ABNORMAL HIGH (ref 70–99)
Glucose-Capillary: 146 mg/dL — ABNORMAL HIGH (ref 70–99)
Glucose-Capillary: 150 mg/dL — ABNORMAL HIGH (ref 70–99)
Glucose-Capillary: 177 mg/dL — ABNORMAL HIGH (ref 70–99)
Glucose-Capillary: 181 mg/dL — ABNORMAL HIGH (ref 70–99)
Glucose-Capillary: 188 mg/dL — ABNORMAL HIGH (ref 70–99)

## 2022-02-20 SURGERY — CRANIOTOMY HEMATOMA EVACUATION SUBDURAL
Anesthesia: General | Site: Head | Laterality: Left

## 2022-02-20 MED ORDER — LIDOCAINE-EPINEPHRINE 1 %-1:100000 IJ SOLN
INTRAMUSCULAR | Status: AC
  Filled 2022-02-20: qty 1

## 2022-02-20 MED ORDER — 0.9 % SODIUM CHLORIDE (POUR BTL) OPTIME
TOPICAL | Status: DC | PRN
  Administered 2022-02-20 (×2): 1000 mL

## 2022-02-20 MED ORDER — DEXAMETHASONE SODIUM PHOSPHATE 10 MG/ML IJ SOLN
INTRAMUSCULAR | Status: AC
  Filled 2022-02-20: qty 1

## 2022-02-20 MED ORDER — PHENYLEPHRINE 80 MCG/ML (10ML) SYRINGE FOR IV PUSH (FOR BLOOD PRESSURE SUPPORT)
PREFILLED_SYRINGE | INTRAVENOUS | Status: AC
Start: 2022-02-20 — End: ?
  Filled 2022-02-20: qty 10

## 2022-02-20 MED ORDER — LIDOCAINE-EPINEPHRINE 1 %-1:100000 IJ SOLN
INTRAMUSCULAR | Status: DC | PRN
  Administered 2022-02-20: 12.5 mL via INTRADERMAL

## 2022-02-20 MED ORDER — ONDANSETRON HCL 4 MG/2ML IJ SOLN
INTRAMUSCULAR | Status: DC | PRN
Start: 2022-02-20 — End: 2022-02-20
  Administered 2022-02-20: 4 mg via INTRAVENOUS

## 2022-02-20 MED ORDER — ROCURONIUM BROMIDE 10 MG/ML (PF) SYRINGE
PREFILLED_SYRINGE | INTRAVENOUS | Status: AC
  Filled 2022-02-20: qty 10

## 2022-02-20 MED ORDER — BUPIVACAINE-EPINEPHRINE (PF) 0.5% -1:200000 IJ SOLN
INTRAMUSCULAR | Status: DC | PRN
  Administered 2022-02-20: 12.5 mL

## 2022-02-20 MED ORDER — SODIUM CHLORIDE 0.9 % IV SOLN: INTRAVENOUS | Status: DC | PRN

## 2022-02-20 MED ORDER — FENTANYL CITRATE (PF) 250 MCG/5ML IJ SOLN
INTRAMUSCULAR | Status: DC | PRN
  Administered 2022-02-20 (×2): 100 ug via INTRAVENOUS
  Administered 2022-02-20: 50 ug via INTRAVENOUS

## 2022-02-20 MED ORDER — THROMBIN 5000 UNITS EX SOLR
CUTANEOUS | Status: AC
Start: 2022-02-20 — End: ?
  Filled 2022-02-20: qty 5000

## 2022-02-20 MED ORDER — DEXAMETHASONE SODIUM PHOSPHATE 10 MG/ML IJ SOLN
INTRAMUSCULAR | Status: DC | PRN
  Administered 2022-02-20: 10 mg via INTRAVENOUS

## 2022-02-20 MED ORDER — BACITRACIN ZINC 500 UNIT/GM EX OINT
TOPICAL_OINTMENT | CUTANEOUS | Status: DC | PRN
  Administered 2022-02-20: 1 via TOPICAL

## 2022-02-20 MED ORDER — GELATIN ABSORBABLE MT POWD
OROMUCOSAL | Status: DC | PRN
  Administered 2022-02-20: 5 mL via TOPICAL

## 2022-02-20 MED ORDER — EPHEDRINE 5 MG/ML INJ
INTRAVENOUS | Status: AC
  Filled 2022-02-20: qty 5

## 2022-02-20 MED ORDER — SODIUM BICARBONATE 8.4 % IV SOLN
100.0000 meq | Freq: Once | INTRAVENOUS | Status: AC
  Administered 2022-02-20: 100 meq via INTRAVENOUS
  Filled 2022-02-20: qty 100

## 2022-02-20 MED ORDER — FENTANYL CITRATE (PF) 250 MCG/5ML IJ SOLN
INTRAMUSCULAR | Status: AC
  Filled 2022-02-20: qty 5

## 2022-02-20 MED ORDER — ROCURONIUM BROMIDE 10 MG/ML (PF) SYRINGE
PREFILLED_SYRINGE | INTRAVENOUS | Status: DC | PRN
Start: 2022-02-20 — End: 2022-02-20
  Administered 2022-02-20: 30 mg via INTRAVENOUS
  Administered 2022-02-20: 50 mg via INTRAVENOUS
  Administered 2022-02-20: 20 mg via INTRAVENOUS

## 2022-02-20 MED ORDER — BUPIVACAINE-EPINEPHRINE 0.5% -1:200000 IJ SOLN
INTRAMUSCULAR | Status: AC
Start: 2022-02-20 — End: ?
  Filled 2022-02-20: qty 1

## 2022-02-20 MED ORDER — THROMBIN 20000 UNITS EX SOLR
CUTANEOUS | Status: DC | PRN
  Administered 2022-02-20: 20 mL via TOPICAL

## 2022-02-20 MED ORDER — PROPOFOL 10 MG/ML IV BOLUS
INTRAVENOUS | Status: DC | PRN
  Administered 2022-02-20 (×2): 50 mg via INTRAVENOUS

## 2022-02-20 MED ORDER — NICARDIPINE HCL IN NACL 40-0.83 MG/200ML-% IV SOLN
3.0000 mg/h | INTRAVENOUS | Status: DC
  Administered 2022-02-20 (×2): 12.5 mg/h via INTRAVENOUS
  Administered 2022-02-20 – 2022-02-21 (×9): 15 mg/h via INTRAVENOUS
  Administered 2022-02-21: 10 mg/h via INTRAVENOUS
  Administered 2022-02-22 – 2022-02-23 (×9): 15 mg/h via INTRAVENOUS
  Administered 2022-02-23: 12.5 mg/h via INTRAVENOUS
  Administered 2022-02-23 (×3): 15 mg/h via INTRAVENOUS
  Filled 2022-02-20: qty 200
  Filled 2022-02-20: qty 400
  Filled 2022-02-20: qty 200
  Filled 2022-02-20: qty 400
  Filled 2022-02-20 (×2): qty 200
  Filled 2022-02-20: qty 400
  Filled 2022-02-20: qty 200
  Filled 2022-02-20 (×4): qty 400
  Filled 2022-02-20 (×2): qty 200
  Filled 2022-02-20 (×2): qty 400
  Filled 2022-02-20 (×2): qty 200

## 2022-02-20 MED ORDER — SODIUM CHLORIDE 23.4 % INJECTION (4 MEQ/ML) FOR IV ADMINISTRATION
120.0000 meq | Freq: Once | INTRAVENOUS | Status: AC
  Administered 2022-02-20: 120 meq via INTRAVENOUS
  Filled 2022-02-20: qty 30

## 2022-02-20 MED ORDER — DEXTROSE 5 % IV SOLN
INTRAVENOUS | Status: DC | PRN
  Administered 2022-02-20: 3 g via INTRAVENOUS

## 2022-02-20 MED ORDER — PROPOFOL 10 MG/ML IV BOLUS
INTRAVENOUS | Status: AC
  Filled 2022-02-20: qty 20

## 2022-02-20 MED ORDER — CEFAZOLIN SODIUM 1 G IJ SOLR
INTRAMUSCULAR | Status: AC
  Filled 2022-02-20: qty 30

## 2022-02-20 MED ORDER — ONDANSETRON HCL 4 MG/2ML IJ SOLN
INTRAMUSCULAR | Status: AC
  Filled 2022-02-20: qty 2

## 2022-02-20 MED ORDER — THROMBIN 20000 UNITS EX SOLR
CUTANEOUS | Status: AC
  Filled 2022-02-20: qty 20000

## 2022-02-20 MED ORDER — BACITRACIN ZINC 500 UNIT/GM EX OINT
TOPICAL_OINTMENT | CUTANEOUS | Status: AC
  Filled 2022-02-20: qty 28.35

## 2022-02-20 SURGICAL SUPPLY — 83 items
BAG COUNTER SPONGE SURGICOUNT (BAG) ×5 IMPLANT
BAG SPNG CNTER NS LX DISP (BAG) ×4
BIT DRILL WIRE PASS 1.3MM (BIT) IMPLANT
BLADE CLIPPER SURG (BLADE) ×2 IMPLANT
BLADE SURG 10 STRL SS (BLADE) ×1 IMPLANT
BUR CARBIDE MATCH 3.0 (BURR) ×2 IMPLANT
BUR SPIRAL ROUTER 2.3 (BUR) ×2 IMPLANT
CANISTER SUCT 3000ML PPV (MISCELLANEOUS) ×3 IMPLANT
CARTRIDGE OIL MAESTRO DRILL (MISCELLANEOUS) ×1 IMPLANT
DIFFUSER DRILL AIR PNEUMATIC (MISCELLANEOUS) ×1 IMPLANT
DRAIN JACKSON RD 7FR 3/32 (WOUND CARE) IMPLANT
DRAIN JP 10F RND RADIO (DRAIN) IMPLANT
DRAPE INCISE IOBAN 66X45 STRL (DRAPES) ×1 IMPLANT
DRAPE NEUROLOGICAL W/INCISE (DRAPES) ×2 IMPLANT
DRAPE SHEET LG 3/4 BI-LAMINATE (DRAPES) ×2 IMPLANT
DRAPE SURG 17X23 STRL (DRAPES) IMPLANT
DRAPE WARM FLUID 44X44 (DRAPES) ×2 IMPLANT
DRILL WIRE PASS 1.3MM (BIT)
DRSG MEPILEX BORDER 4X8 (GAUZE/BANDAGES/DRESSINGS) ×3 IMPLANT
DRSG OPSITE POSTOP 4X10 (GAUZE/BANDAGES/DRESSINGS) ×1 IMPLANT
DRSG OPSITE POSTOP 4X8 (GAUZE/BANDAGES/DRESSINGS) ×1 IMPLANT
DURAPREP 6ML APPLICATOR 50/CS (WOUND CARE) ×2 IMPLANT
ELECT COATED BLADE 2.86 ST (ELECTRODE) ×2 IMPLANT
ELECT REM PT RETURN 9FT ADLT (ELECTROSURGICAL) ×2
ELECTRODE REM PT RTRN 9FT ADLT (ELECTROSURGICAL) ×1 IMPLANT
EVACUATOR SILICONE 100CC (DRAIN) IMPLANT
FORCEPS BIPO MALIS IRRIG 9X1.5 (NEUROSURGERY SUPPLIES) ×1 IMPLANT
GAUZE 4X4 16PLY ~~LOC~~+RFID DBL (SPONGE) ×4 IMPLANT
GAUZE SPONGE 4X4 12PLY STRL (GAUZE/BANDAGES/DRESSINGS) IMPLANT
GLOVE BIO SURGEON STRL SZ 6.5 (GLOVE) ×2 IMPLANT
GLOVE BIO SURGEON STRL SZ7 (GLOVE) ×2 IMPLANT
GLOVE BIOGEL PI IND STRL 8 (GLOVE) ×2 IMPLANT
GLOVE BIOGEL PI INDICATOR 8 (GLOVE) ×2
GLOVE ECLIPSE 8.0 STRL XLNG CF (GLOVE) ×4 IMPLANT
GLOVE EXAM NITRILE LRG STRL (GLOVE) IMPLANT
GLOVE EXAM NITRILE XL STR (GLOVE) IMPLANT
GLOVE EXAM NITRILE XS STR PU (GLOVE) IMPLANT
GLOVE SURG ENC MOIS LTX SZ8 (GLOVE) ×2 IMPLANT
GLOVE SURG UNDER POLY LF SZ8.5 (GLOVE) ×2 IMPLANT
GOWN STRL REUS W/ TWL LRG LVL3 (GOWN DISPOSABLE) IMPLANT
GOWN STRL REUS W/ TWL XL LVL3 (GOWN DISPOSABLE) ×2 IMPLANT
GOWN STRL REUS W/TWL 2XL LVL3 (GOWN DISPOSABLE) IMPLANT
GOWN STRL REUS W/TWL LRG LVL3 (GOWN DISPOSABLE) ×4
GOWN STRL REUS W/TWL XL LVL3 (GOWN DISPOSABLE) ×4
GRAFT DURAGEN MATRIX 5WX7L (Graft) ×1 IMPLANT
HEMOSTAT POWDER KIT SURGIFOAM (HEMOSTASIS) ×2 IMPLANT
HEMOSTAT SURGICEL 2X14 (HEMOSTASIS) ×2 IMPLANT
IV NS 1000ML (IV SOLUTION)
IV NS 1000ML BAXH (IV SOLUTION) ×1 IMPLANT
KIT BASIN OR (CUSTOM PROCEDURE TRAY) ×2 IMPLANT
KIT TURNOVER KIT B (KITS) ×2 IMPLANT
NEEDLE HYPO 22GX1.5 SAFETY (NEEDLE) ×2 IMPLANT
NS IRRIG 1000ML POUR BTL (IV SOLUTION) ×3 IMPLANT
OIL CARTRIDGE MAESTRO DRILL (MISCELLANEOUS)
PACK CRANIOTOMY CUSTOM (CUSTOM PROCEDURE TRAY) ×2 IMPLANT
PACK LAMINECTOMY NEURO (CUSTOM PROCEDURE TRAY) IMPLANT
PATTIES SURGICAL .5 X.5 (GAUZE/BANDAGES/DRESSINGS) IMPLANT
PATTIES SURGICAL .5 X3 (DISPOSABLE) IMPLANT
PATTIES SURGICAL 1X1 (DISPOSABLE) IMPLANT
PERFORATOR LRG  14-11MM (BIT) ×1
PERFORATOR LRG 14-11MM (BIT) ×1 IMPLANT
RETRACTOR LONE STAR DISPOSABLE (INSTRUMENTS) ×4 IMPLANT
SET TUBING IRRIGATION DISP (TUBING) ×1 IMPLANT
SPONGE NEURO XRAY DETECT 1X3 (DISPOSABLE) IMPLANT
SPONGE SURGIFOAM ABS GEL 100 (HEMOSTASIS) ×2 IMPLANT
STAPLER VISISTAT 35W (STAPLE) ×4 IMPLANT
STOCKINETTE 6  STRL (DRAPES) ×1
STOCKINETTE 6 STRL (DRAPES) ×1 IMPLANT
STRIP CLOSURE SKIN 1/2X4 (GAUZE/BANDAGES/DRESSINGS) ×1 IMPLANT
SUT ETHILON 3 0 FSL (SUTURE) IMPLANT
SUT ETHILON 3 0 PS 1 (SUTURE) IMPLANT
SUT NURALON 4 0 TR CR/8 (SUTURE) ×6 IMPLANT
SUT VIC AB 0 CT1 18XCR BRD8 (SUTURE) ×1 IMPLANT
SUT VIC AB 0 CT1 8-18 (SUTURE) ×2
SUT VIC AB 2-0 CP2 18 (SUTURE) ×6 IMPLANT
SUT VICRYL RAPIDE 4/0 PS 2 (SUTURE) ×2 IMPLANT
TAPE UMBILICAL 1/8 X36 TWILL (MISCELLANEOUS) ×1 IMPLANT
TOWEL GREEN STERILE (TOWEL DISPOSABLE) ×2 IMPLANT
TOWEL GREEN STERILE FF (TOWEL DISPOSABLE) ×2 IMPLANT
TRAY FOLEY MTR SLVR 16FR STAT (SET/KITS/TRAYS/PACK) ×1 IMPLANT
TUBE CONNECTING 12X1/4 (SUCTIONS) ×1 IMPLANT
UNDERPAD 30X36 HEAVY ABSORB (UNDERPADS AND DIAPERS) ×2 IMPLANT
WATER STERILE IRR 1000ML POUR (IV SOLUTION) ×2 IMPLANT

## 2022-02-20 NOTE — Progress Notes (Signed)
STROKE TEAM PROGRESS NOTE  ? ?INTERVAL HISTORY ?  Patient still intubated on sedation.  Na 150 this am.  ?CCM managing BP,dialysis this am.  ?CXR this am. ? ?Vitals:  ? 02-24-2022 1230 Feb 24, 2022 1245 2022/02/24 1300 Feb 24, 2022 1315  ?BP:      ?Pulse: (!) 48 (!) 48 (!) 48 (!) 48  ?Resp: (!) 35 (!) 35 (!) 35 (!) 35  ?Temp:      ?TempSrc:      ?SpO2: 92% 93% 93% 93%  ?Weight:      ?Height:      ? ?CBC:  ?Recent Labs  ?Lab 02/19/22 ?0230 02/19/22 ?0518 02/19/22 ?1152 02/19/22 ?2349 24-Feb-2022 ?0327  ?WBC 11.0* 11.6*  --   --   --   ?NEUTROABS 9.4* 9.5*  --   --   --   ?HGB 11.3* 11.2*   < > 11.2* 11.2*  ?HCT 39.8 40.7   < > 33.0* 33.0*  ?MCV 88.6 89.6  --   --   --   ?PLT 221 208  --   --   --   ? < > = values in this interval not displayed.  ? ? ?Basic Metabolic Panel:  ?Recent Labs  ?Lab 02/19/22 ?0518 02/19/22 ?1140 02/19/22 ?1616 02/19/22 ?1932 2022/02/24 ?0327 02-24-2022 ?5329 Feb 24, 2022 ?9242  ?NA 159*   < > 157*  157*   < > 150* 150* 148*  ?K 6.6*   < > 5.6*   < > 4.9 4.8  --   ?CL 128*   < > 125*  --   --  107  --   ?CO2 23   < > 24  --   --  32  --   ?GLUCOSE 136*   < > 103*  --   --  138*  --   ?BUN 59*   < > 55*  --   --  58*  --   ?CREATININE 6.28*   < > 6.24*  --   --  5.49*  --   ?CALCIUM 8.2*   < > 8.1*  --   --  7.9*  --   ?MG 2.6*  --   --   --   --  2.4  --   ?PHOS 9.6*  --  8.0*  --   --  7.9*  --   ? < > = values in this interval not displayed.  ? ? ?Lipid Panel:  ?Recent Labs  ?Lab 02/14/22 ?0352 02/15/22 ?0452 02/19/22 ?0518  ?CHOL 192  --   --   ?TRIG 309*   < > 423*  ?HDL 20*  --   --   ?CHOLHDL 9.6  --   --   ?VLDL 62*  --   --   ?LDLCALC 110*  --   --   ? < > = values in this interval not displayed.  ? ? ?HgbA1c:  ?Recent Labs  ?Lab 02/04/2022 ?1751  ?HGBA1C 5.5  ? ? ?Urine Drug Screen:  ?Recent Labs  ?Lab 02/12/2022 ?1601  ?LABOPIA NONE DETECTED  ?COCAINSCRNUR NONE DETECTED  ?LABBENZ NONE DETECTED  ?AMPHETMU NONE DETECTED  ?THCU NONE DETECTED  ?LABBARB NONE DETECTED  ? ?  ?Alcohol Level  ?No results for  input(s): ETH in the last 168 hours. ? ? ?IMAGING past 24 hours ?DG CHEST PORT 1 VIEW ? ?Result Date: 24-Feb-2022 ?CLINICAL DATA:  Code stroke. COVID-19 positive. Evaluate lung status. EXAM: PORTABLE CHEST 1 VIEW COMPARISON:  02/19/2022 FINDINGS: Endotracheal tube has tip  4.2 cm above the carina. Right IJ central venous catheter unchanged. Left IJ central venous catheter has tip horizontal 2 obliquely oriented over the SVC unchanged. Enteric tube courses into the region of the stomach and off the film. Lungs are adequately inflated and demonstrate hazy asymmetric prominence of the pulmonary vessels left worse than right without significant change likely mild vascular congestion. Possible hazy opacification left base/retrocardiac region unchanged. No definite effusion. Cardiomediastinal silhouette and remainder of the exam is unchanged. IMPRESSION: 1. Stable hazy prominence of the pulmonary vessels left worse than right likely mild vascular congestion. Stable mild hazy left base opacification which may be atelectasis or early infection. 2. Tubes and lines as described. Electronically Signed   By: Elberta Fortis M.D.   On: 22-Feb-2022 08:33   ? ?PHYSICAL EXAM ? ?Temp:  [93.9 ?F (34.4 ?C)-97.5 ?F (36.4 ?C)] 97.4 ?F (36.3 ?C) (05/06 1200) ?Pulse Rate:  [44-66] 48 (05/06 1315) ?Resp:  [29-40] 35 (05/06 1315) ?BP: (112-170)/(52-60) 150/54 (05/06 1148) ?SpO2:  [91 %-98 %] 93 % (05/06 1315) ?Arterial Line BP: (128-174)/(53-73) 164/58 (05/06 1315) ?FiO2 (%):  [50 %-70 %] 50 % (05/06 1148) ?Weight:  [168 kg] 168 kg (05/06 0500) ? ?General - overweight, intubated on sedation. ? ?Cardiovascular - Regular rate and rhythm. ? ?Neuro - intubated on sedation.  ?Exam consistent with sedation. Eye closed. Does not follow commands.  pupil equal size, sluggish to light. Corneal reflex weakly present, gag and cough present. Breathing over the vent.  On pain stimulation, no  movement in all extremities. Toes mute. ? ? ?ASSESSMENT/PLAN ?Mr.  Clinton Taylor is a 36 y.o. male with history of HTN, HLD, obesity, smoking, OSA on CPAP and mood disorder presenting with acute right hemiplegia with right sided numbness.  He was taken to [facility], was found to have an ICH and was transferred here for further management.  ICH score is 1.  Head CT last night reveals increasing left to right midline shift to 48mm without hydrocephalus.  3% HTS is off now due to Na 160.  Patient has confirmed covid-19 infection.  His wife reports that he smokes cigarettes and is not always compliant with his medical therapy at home. ? ?ICH:  left large ICH involving corona radiata, lentiform nucleus and subinsular white matter with 22mm left to right midline shift  ?CT head IPH involving left corona radiata, left lentiform nucleus and subinsular white matter with regional mass effect and 32mm midline shift ?CTA Head and Neck Nondiagnostic exam ?Follow up CT 4/29 2112 unchanged size of left ICH with increased edema and 61mm left to right midline shift ?CT head 4/30 unchanged hematoma size, midline shift 7 mm ?CT repeat 5/1 stable hematoma, midline shift 5 mm ?CT repeat 5/4 stable IPH with 60mm midline shift ?May consider MRI and MRA once stable ?2D Echo EF 60-65%, interatrial septum not well visualized ?LDL 110 ?HgbA1c 5.5 ?VTE prophylaxis - SCDs ?No antithrombotic prior to admission, now on No antithrombotic secondary to ICH ?Therapy recommendations:  CIR ?Disposition:  pending ? ?Cerebral Edema (brain compression) ?Cerebral edema present from ICH with 19mm midline shift ?3% HTS at 75 cc/hr -> NS @ 50-> off> ?CT Head 4/30 unchanged hematoma, midline shift 7 mm ?CT head 5/1 midline shift 68mm ?CT head 5/4 midline shift 35mm ?Na goal 150-155 ?Na 148->151->152->159-> 162->157->154>150>148 ?Na monitoring Q6h ? ?Hypertensive emergency ?Home meds:  losartan-HCTZ 100-12.5 daily, amlodipine 10 mg daily ?Off cleviprex drip. ?On cardene ggt. Coreg held due to bradycardia in 50's. ?Keep  SBP <  160 ?On amlodipine 10 and coreg, hydralazine ?Long-term BP goal normotensive ? ?Worsening AKI with ATN ?Cre 2.59->2.72->2.90->3.37->3.56-> 3.72-> 3.96->6.28->7.10 ?Nephro on board, appreciate recommendations ?Considering ATN ?Now on CRRT ? ?Covid-19 infection with respiratory failure ?Patient is positive for covid-19 infection ?Was on BiPAP -> intubated ?CCM on board ?On decadron IV->solumedrol ?Per report, pt refused remedesivir and paxlovid  ?Ventilator management by CCM ?On rocephine ?Tmax 101.4->afebrile ?Leukocytosis WBC 15.2-12.4->12.9->11.5-> 11.3-> 8.4->11.6 ? ?Hyperlipidemia ?Home meds:  none ?LDL 110, goal < 70 ?Hold off statin now given ICH ?Add statin at discharge ? ?Tobacco abuse ?Current smoker ?Smoking cessation counseling provided ?Nicotine patch provided ?Pt is willing to quit ? ?Other Stroke Risk Factors ?Obesity, Body mass index is 54.69 kg/m?., BMI >/= 30 associated with increased stroke risk, recommend weight loss, diet and exercise as appropriate  ?Obstructive sleep apnea, on CPAP at home ? ?Other Active Problems ?Mood disorder ?Pneumonia: zithromax stopped yesterday, on rocephin. ? ?Hospital day # 7 ? ?Na trending down. Exam consistent with heavy sedation. CCM following. On cardene ggt, coreg held due to bradycardia. Will try for MRI today, discussed with Dr. Katrinka BlazingSmith. He plans on CT head first to r/o cushing response.  ? ?This patient is critically ill due to left BG large ICH, cerebral edema, COVID infection, respiratory failure, renal failure and at significant risk of neurological worsening, death form hematoma expansion, brain herniation, seizure, complication from COVID and respiratory failure. This patient's care requires constant monitoring of vital signs, hemodynamics, respiratory and cardiac monitoring, review of multiple databases, neurological assessment, discussion with family, other specialists and medical decision making of high complexity. I spent 40 minutes of neurocritical care  time in the care of this patient.  Discussed with Dr. Katrinka BlazingSmith.  ? ? ? ?To contact Stroke Continuity provider, please refer to [email or web address]. ?After hours, contact General Neurology  ?

## 2022-02-20 NOTE — Consult Note (Addendum)
Reason for Consult: ICH, cerebral edema  ?Referring Physician: Harolyn RutherfordPalikh, Gaurang M, MD  ? ?HPI: Clinton GaussBryan Taylor is a 36 y.o. male with a PmHx of uncontrolled HTN, HLD, obesity, tobacco abuse, OSA on CPAP, and mood disorder who initially presented to [facility] on 01/30/2022 with c/o acute onset of right-sided hemiplegia and paraesthesia. CT head revealed an ICH and he was subsequently transferred to [facility] for further management. Incidentally positive for covid, now with covid pneumonia. He was inially doing fair but throughout his admission he had declining neurological functioning. He was intubated on 5/4 due to respiratory failure. He was started on CRRT on 5/05 due to AKI on CKD IIIb. Today, the patient began to have episodes of bradycardia and was sent for a CTH by CCM. CTH revealed stable appearance of hemorrhage but had increasing mass effect and left-to-right MLS of 11mm. Neurosurgery consult was requested to assess for potential intervention. ? ? ?Past Medical History:  ?Diagnosis Date  ? Anger   ? Anxiety   ? Depression   ? Hiatal hernia   ? Hypertension   ? Mood changes   ? Obsessive compulsive disorder   ? ? ?No past surgical history on file. ? ?Family History  ?Problem Relation Age of Onset  ? OCD Mother   ? Alcohol abuse Father   ? Bipolar disorder Father   ? Drug abuse Father   ? Alcohol abuse Maternal Uncle   ? Alcohol abuse Paternal Uncle   ? OCD Maternal Grandfather   ? Alcohol abuse Paternal Grandmother   ? Drug abuse Paternal Grandmother   ? ? ?Social History:  reports that he has quit smoking. His smoking use included cigarettes. He smoked an average of 1 pack per day. He has quit using smokeless tobacco.  His smokeless tobacco use included chew. He reports that he does not drink alcohol and does not use drugs. ? ?Allergies: No Known Allergies ? ?Medications: I have reviewed the patient's current medications. ? ?Results for orders placed or performed during the hospital encounter of  01/16/2022 (from the past 48 hour(s))  ?I-STAT 7, (LYTES, BLD GAS, ICA, H+H)     Status: Abnormal  ? Collection Time: 02/18/22  6:23 PM  ?Result Value Ref Range  ? pH, Arterial 7.161 (LL) 7.35 - 7.45  ? pCO2 arterial 64.3 (H) 32 - 48 mmHg  ? pO2, Arterial 432 (H) 83 - 108 mmHg  ? Bicarbonate 22.7 20.0 - 28.0 mmol/L  ? TCO2 25 22 - 32 mmol/L  ? O2 Saturation 100 %  ? Acid-base deficit 6.0 (H) 0.0 - 2.0 mmol/L  ? Sodium 164 (HH) 135 - 145 mmol/L  ? Potassium 4.8 3.5 - 5.1 mmol/L  ? Calcium, Ion 1.22 1.15 - 1.40 mmol/L  ? HCT 33.0 (L) 39.0 - 52.0 %  ? Hemoglobin 11.2 (L) 13.0 - 17.0 g/dL  ? Patient temperature 100.4 F   ? Sample type ARTERIAL   ? Comment NOTIFIED PHYSICIAN   ?I-STAT 7, (LYTES, BLD GAS, ICA, H+H)     Status: Abnormal  ? Collection Time: 02/18/22  6:29 PM  ?Result Value Ref Range  ? pH, Arterial 7.150 (LL) 7.35 - 7.45  ? pCO2 arterial 65.0 (H) 32 - 48 mmHg  ? pO2, Arterial 404 (H) 83 - 108 mmHg  ? Bicarbonate 22.3 20.0 - 28.0 mmol/L  ? TCO2 24 22 - 32 mmol/L  ? O2 Saturation 100 %  ? Acid-base deficit 7.0 (H) 0.0 - 2.0 mmol/L  ?  Sodium 163 (HH) 135 - 145 mmol/L  ? Potassium 4.8 3.5 - 5.1 mmol/L  ? Calcium, Ion 1.21 1.15 - 1.40 mmol/L  ? HCT 37.0 (L) 39.0 - 52.0 %  ? Hemoglobin 12.6 (L) 13.0 - 17.0 g/dL  ? Patient temperature 100.4 F   ? Sample type ARTERIAL   ? Comment NOTIFIED PHYSICIAN   ?Sodium     Status: Abnormal  ? Collection Time: 02/18/22  6:38 PM  ?Result Value Ref Range  ? Sodium 161 (HH) 135 - 145 mmol/L  ?  Comment: CRITICAL RESULT CALLED TO, READ BACK BY AND VERIFIED WITH: Rexene Alberts 1929 02/18/2022 WBOND ?Performed at [facility], [address] [place], [place] [address] ?  ?Glucose, capillary     Status: Abnormal  ? Collection Time: 02/18/22  7:39 PM  ?Result Value Ref Range  ? Glucose-Capillary 114 (H) 70 - 99 mg/dL  ?  Comment: Glucose reference range applies only to samples taken after fasting for at least 8 hours.  ?I-STAT 7, (LYTES, BLD GAS, ICA, H+H)     Status: Abnormal  ?  Collection Time: 02/18/22 10:37 PM  ?Result Value Ref Range  ? pH, Arterial 7.163 (LL) 7.35 - 7.45  ? pCO2 arterial 66.6 (HH) 32 - 48 mmHg  ? pO2, Arterial 341 (H) 83 - 108 mmHg  ? Bicarbonate 23.8 20.0 - 28.0 mmol/L  ? TCO2 26 22 - 32 mmol/L  ? O2 Saturation 100 %  ? Acid-base deficit 5.0 (H) 0.0 - 2.0 mmol/L  ? Sodium 162 (HH) 135 - 145 mmol/L  ? Potassium 5.5 (H) 3.5 - 5.1 mmol/L  ? Calcium, Ion 1.23 1.15 - 1.40 mmol/L  ? HCT 33.0 (L) 39.0 - 52.0 %  ? Hemoglobin 11.2 (L) 13.0 - 17.0 g/dL  ? Patient temperature 99.5 F   ? Collection site Femoral   ? Drawn by HIDE   ? Sample type ARTERIAL   ? Comment NOTIFIED PHYSICIAN   ?Sodium     Status: Abnormal  ? Collection Time: 02/18/22 11:44 PM  ?Result Value Ref Range  ? Sodium 161 (HH) 135 - 145 mmol/L  ?  Comment: CRITICAL RESULT CALLED TO, READ BACK BY AND VERIFIED WITH: ?SIMPSON B,RN 02/19/22 0032 WAYK ?Performed at [facility], [address], [place], [place] [address] ?  ?Glucose, capillary     Status: Abnormal  ? Collection Time: 02/18/22 11:45 PM  ?Result Value Ref Range  ? Glucose-Capillary 130 (H) 70 - 99 mg/dL  ?  Comment: Glucose reference range applies only to samples taken after fasting for at least 8 hours.  ?CBC with Differential/Platelet     Status: Abnormal  ? Collection Time: 02/19/22  2:30 AM  ?Result Value Ref Range  ? WBC 11.0 (H) 4.0 - 10.5 K/uL  ? RBC 4.49 4.22 - 5.81 MIL/uL  ? Hemoglobin 11.3 (L) 13.0 - 17.0 g/dL  ? HCT 39.8 39.0 - 52.0 %  ? MCV 88.6 80.0 - 100.0 fL  ? MCH 25.2 (L) 26.0 - 34.0 pg  ? MCHC 28.4 (L) 30.0 - 36.0 g/dL  ? RDW 20.0 (H) 11.5 - 15.5 %  ? Platelets 221 150 - 400 K/uL  ? nRBC 0.2 0.0 - 0.2 %  ? Neutrophils Relative % 86 %  ? Neutro Abs 9.4 (H) 1.7 - 7.7 K/uL  ? Lymphocytes Relative 6 %  ? Lymphs Abs 0.7 0.7 - 4.0 K/uL  ? Monocytes Relative 5 %  ? Monocytes Absolute 0.6  0.1 - 1.0 K/uL  ? Eosinophils Relative 0 %  ? Eosinophils Absolute 0.0 0.0 - 0.5 K/uL  ? Basophils Relative 1 %  ? Basophils Absolute 0.1 0.0 - 0.1  K/uL  ? Immature Granulocytes 2 %  ? Abs Immature Granulocytes 0.27 (H) 0.00 - 0.07 K/uL  ?  Comment: Performed at [facility], [address], [place], [place] [address]  ?Glucose, capillary     Status: Abnormal  ? Collection Time: 02/19/22  3:53 AM  ?Result Value Ref Range  ? Glucose-Capillary 131 (H) 70 - 99 mg/dL  ?  Comment: Glucose reference range applies only to samples taken after fasting for at least 8 hours.  ?Renal function panel     Status: Abnormal  ? Collection Time: 02/19/22  5:18 AM  ?Result Value Ref Range  ? Sodium 159 (H) 135 - 145 mmol/L  ? Potassium 6.6 (HH) 3.5 - 5.1 mmol/L  ?  Comment: NO VISIBLE HEMOLYSIS ?CRITICAL RESULT CALLED TO, READ BACK BY AND VERIFIED WITH: ?BROOM E,RN 02/19/22 0616 WAYK ?  ? Chloride 128 (H) 98 - 111 mmol/L  ? CO2 23 22 - 32 mmol/L  ? Glucose, Bld 136 (H) 70 - 99 mg/dL  ?  Comment: Glucose reference range applies only to samples taken after fasting for at least 8 hours.  ? BUN 59 (H) 6 - 20 mg/dL  ? Creatinine, Ser 6.28 (H) 0.61 - 1.24 mg/dL  ?  Comment: DELTA CHECK NOTED  ? Calcium 8.2 (L) 8.9 - 10.3 mg/dL  ? Phosphorus 9.6 (H) 2.5 - 4.6 mg/dL  ? Albumin 2.8 (L) 3.5 - 5.0 g/dL  ? GFR, Estimated 11 (L) >60 mL/min  ?  Comment: (NOTE) ?Calculated using the CKD-EPI Creatinine Equation (2021) ?  ? Anion gap 8 5 - 15  ?  Comment: Performed at [facility], [address], [place], [place] [address]  ?CBC with Differential/Platelet     Status: Abnormal  ? Collection Time: 02/19/22  5:18 AM  ?Result Value Ref Range  ? WBC 11.6 (H) 4.0 - 10.5 K/uL  ? RBC 4.54 4.22 - 5.81 MIL/uL  ? Hemoglobin 11.2 (L) 13.0 - 17.0 g/dL  ? HCT 40.7 39.0 - 52.0 %  ? MCV 89.6 80.0 - 100.0 fL  ? MCH 24.7 (L) 26.0 - 34.0 pg  ? MCHC 27.5 (L) 30.0 - 36.0 g/dL  ? RDW 19.9 (H) 11.5 - 15.5 %  ? Platelets 208 150 - 400 K/uL  ? nRBC 0.3 (H) 0.0 - 0.2 %  ? Neutrophils Relative % 81 %  ? Neutro Abs 9.5 (H) 1.7 - 7.7 K/uL  ? Lymphocytes Relative 8 %  ? Lymphs Abs 0.9 0.7 - 4.0 K/uL  ? Monocytes  Relative 7 %  ? Monocytes Absolute 0.8 0.1 - 1.0 K/uL  ? Eosinophils Relative 0 %  ? Eosinophils Absolute 0.0 0.0 - 0.5 K/uL  ? Basophils Relative 1 %  ? Basophils Absolute 0.1 0.0 - 0.1 K/uL  ? Immature G

## 2022-02-20 NOTE — Progress Notes (Signed)
Discussed with Dr. Jake Samples neurosurgery. Patient taken to OR emergently tonight.  Discussed that we would hold on resuming CRRT given need for heparinization of circuit.  Patient has no urgent need for CRRT based on electrolyte abnormalities from 4pm labs earlier tonight.  ? ?Would not resume any form or heparin without discussing again with neurosurgery.  ? ?Durel Salts, MD ?Pulmonary and Critical Care Medicine ?[facility] ?03/11/2022 9:59 PM ?Pager: see AMION ? ?If no response to pager, please call critical care on call (see AMION) until 7pm ?After 7:00 pm call Elink   ? ?

## 2022-02-20 NOTE — Progress Notes (Signed)
Dr. Jake Samples present and gave order to discontinue both MRIs that are ordered. RN made MD aware that tube feeds have been off since 1400.  ?

## 2022-02-20 NOTE — Transfer of Care (Signed)
Immediate Anesthesia Transfer of Care Note ? ?Patient: Clinton Taylor ? ?Procedure(s) Performed: HEMICRANIECTOMY, Placement of Skull Flap in Abdomen (Left: Head) ? ?Patient Location: PACU and ICU ? ?Anesthesia Type:General ? ?Level of Consciousness: sedated and Patient remains intubated per anesthesia plan ? ?Airway & Oxygen Therapy: Patient remains intubated per anesthesia plan and Patient placed on Ventilator (see vital sign flow sheet for setting) ? ?Post-op Assessment: Report given to RN and Post -op Vital signs reviewed and stable ? ?Post vital signs: Reviewed and stable ? ?Last Vitals:  ?Vitals Value Taken Time  ?BP 126/63 Mar 16, 2022 2200  ?Temp    ?Pulse 63 Mar 16, 2022 2209  ?Resp 31 2022-03-16 2209  ?SpO2 93 % Mar 16, 2022 2209  ?Vitals shown include unvalidated device data. ? ?Last Pain:  ?Vitals:  ? 16-Mar-2022 1600  ?TempSrc: Oral  ?PainSc:   ?   ? ?  ? ?Complications: No notable events documented. ?

## 2022-02-20 NOTE — Progress Notes (Signed)
CRRT off for CT scan and MRI. Will restart CRRT upon return from MRI.  ?

## 2022-02-20 NOTE — Progress Notes (Signed)
Report given to Asher Muir, CRNA and Sharlynn Oliphant, RN OR nurse. Report also given to Hervey Ard, RN who will be caring for patient tonight in ICU.  ?

## 2022-02-20 NOTE — Progress Notes (Signed)
eLink Physician-Brief Progress Note ?Patient Name: Kannan Proia ?DOB: 1985-10-22 ?MRN: [id number] ? ? ?Date of Service ? 03/06/2022  ?HPI/Events of Note ? ABG on 60%/PRVC 35/TV 420/P 18 = 7.189/85.9/267/33.0. Already on pre and post filter NaHCO3 IV infusions.  ?eICU Interventions ? Plan: ?NaHCO3 100 meq IV X 1 now. ?Repeat ABG at 5 AM.  ? ? ? ?Intervention Category ?Major Interventions: Acid-Base disturbance - evaluation and management;Respiratory failure - evaluation and management ? ?Aymen Widrig Dennard Nip ?03/13/2022, 12:41 AM ?

## 2022-02-20 NOTE — Progress Notes (Signed)
Pt was transported via ventilator to CT with no apparent complications. RT will continue to monitor ? ?

## 2022-02-20 NOTE — Anesthesia Postprocedure Evaluation (Signed)
Anesthesia Post Note ? ?Patient: Clinton Taylor ? ?Procedure(s) Performed: HEMICRANIECTOMY, Placement of Skull Flap in Abdomen (Left: Head) ? ?  ? ?Patient location during evaluation: ICU ?Anesthesia Type: General ?Level of consciousness: patient remains intubated per anesthesia plan ?Pain management: pain level controlled ?Vital Signs Assessment: post-procedure vital signs reviewed and stable ?Respiratory status: patient remains intubated per anesthesia plan ?Cardiovascular status: stable ?Postop Assessment: no apparent nausea or vomiting ?Anesthetic complications: no ? ? ?No notable events documented. ? ?Last Vitals:  ?Vitals:  ? 02/28/2022 1925 02/28/2022 2204  ?BP:    ?Pulse: 68   ?Resp: (!) 35   ?Temp:    ?SpO2: 91% 95%  ?  ?Last Pain:  ?Vitals:  ? 02/24/2022 1600  ?TempSrc: Oral  ?PainSc:   ? ? ?  ?  ?  ?  ?  ?  ? ?Dianna Ewald ? ? ? ? ?

## 2022-02-20 NOTE — Anesthesia Preprocedure Evaluation (Addendum)
Anesthesia Evaluation  ?Patient identified by MRN, date of birth, ID band ?Patient unresponsive ? ?Preop documentation limited or incomplete due to emergent nature of procedure. ? ?Airway ?Mallampati: Intubated ? ? ? ? ? ? Dental ?  ?Pulmonary ?former smoker,  ?  ?breath sounds clear to auscultation ? ? ? ? ? ? Cardiovascular ?hypertension,  ?Rhythm:Regular Rate:Normal ? ? ?  ?Neuro/Psych ?  ? GI/Hepatic ?Neg liver ROS, hiatal hernia,   ?Endo/Other  ? ? Renal/GU ?Renal disease  ? ?  ?Musculoskeletal ? ? Abdominal ?  ?Peds ? Hematology ?  ?Anesthesia Other Findings ? ? Reproductive/Obstetrics ? ?  ? ? ? ? ? ? ? ? ? ? ? ? ? ?  ?  ? ? ? ? ? ? ?Anesthesia Physical ?Anesthesia Plan ? ?ASA: 4 and emergent ? ?Anesthesia Plan: General  ? ?Post-op Pain Management:   ? ?Induction: Intravenous ? ?PONV Risk Score and Plan: 2 and Ondansetron ? ?Airway Management Planned: Oral ETT ? ?Additional Equipment:  ? ?Intra-op Plan:  ? ?Post-operative Plan: Post-operative intubation/ventilation ? ?Informed Consent:  ? ?Plan Discussed with: Anesthesiologist and CRNA ? ?Anesthesia Plan Comments:   ? ? ? ? ? ? ?Anesthesia Quick Evaluation ? ?

## 2022-02-20 NOTE — Progress Notes (Signed)
Patient transported form OR back to 4N18 via the ventilator with no complications. ?

## 2022-02-20 NOTE — Progress Notes (Signed)
Increased mass effect on CT but no new hemorrhage, to get hypertonic saline goal sodium 160 per NSGY, will let nephro know. ? ?Myrla Halsted MD PCCM ?

## 2022-02-20 NOTE — Progress Notes (Signed)
Spoke with Dr. Katrinka Blazing regarding patient's heart rate mid 50's and coreg is due. MD gave order to hold morning dose of coreg.  ?

## 2022-02-20 NOTE — Progress Notes (Signed)
Dr. Tamala Julian at bedside speaking with family. Informed MD that esophageal temp probe reads 95, oral reads 97 and axillary reads 96. MD stated to use oral temperature reading. Discussed bradycardia rate of mid to upper 40's. MD stated to notify him if heart rate stays below 45 consistently.  ?
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
## 2022-02-20 NOTE — Progress Notes (Signed)
Notified Dr. Katrinka Blazing that patient's heart rate has went down to as low as 46 and is staying in the mid to upper 40's. MD acknowledged and gave no new orders.  ?

## 2022-02-20 NOTE — Plan of Care (Addendum)
Pt with worsening shift on HCT ordered due to bradycardia by CCM. Shift if now 2mm with no trapping on ventricles. Hypertonic bolus 23.4% given goal NA is 160. Dr. Reatha Armour with Neurosurgery consutled and case discussed over the phone. Dr. Tamala Julian with CCM updated. Will change neuro checks to q 1 hr. Stat HCT if worsening neuro status. MRI/A was planned but neurosurgery maybe planning possible intervention per NP. MRI on hold. ? ?Overnight Neurologist Dr. Cheral Marker updated. He will cover. Plan to transition to CCM as primary after surgery. Discussed with Dr. Tamala Julian. ?

## 2022-02-20 NOTE — Progress Notes (Signed)
Patient transported to OR from 4N18 via the ventilator with no complications ?

## 2022-02-20 NOTE — Op Note (Signed)
? ?Providing Compassionate, Quality Care - Together ? ?Date of service: 02/17/2022 ? ?PREOP DIAGNOSIS:  ?Left large basal ganglia intracerebral hemorrhage with significant midline shift, 11 mm ? ?POSTOP DIAGNOSIS: Same ? 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 ?SURGEON: Dr. Pieter Partridge C. Johnisha Louks, DO ? ?ASSISTANT: Weston Brass, NP ? ?ANESTHESIA: General Endotracheal ? ?EBL: 150 cc ? ?SPECIMENS: None ? ?DRAINS: None ? ?COMPLICATIONS: None ? ?CONDITION: Hemodynamically stable ? ?HISTORY: ?Clinton Taylor is a 36 y.o. male that presented with a hypertensive hemorrhage in the left basal ganglia with symptoms of right-sided significant weakness.  He unfortunately had multiorgan failure including acute on chronic kidney disease as well as vent dependent respiratory failure due to COVID.  Repeat CT imaging revealed increased mass effect despite conservative measures including hypertonic saline with approximately 11 mm of left to right shift due to cerebral edema.  The intracerebral hemorrhage was stable in size over period of days however the edema was resistant to hypertonic saline.  His neurologic exam was poor, minimal motor response with very little withdrawal on the left, weak corneal reflexes bilaterally and reactive pupils bilaterally therefore in discussion with the wife and the patient's mother, I recommended a left hemicraniectomy for decompression given his severe intracranial hypertension.  We discussed all risks, benefits and expected outcomes and alternatives, answered all of their questions.  Informed consent was obtained from the wife. ? ?PROCEDURE IN DETAIL: ?The patient was brought to the operating room. After induction of general anesthesia, the patient was positioned on the operative table in the supine position. All pressure points were meticulously padded.  The head was gently turned to the right to expose the left  hemicranium.  The left hemicranium was clipped free of hair.  A reverse question mark skin incision was then marked out and prepped and draped in the usual sterile fashion and a left abdominal incision was marked out. Physician driven timeout was performed. ? ?Using a 10 blade, the left hemicranium incision was created down to the periosteum.  Raney clips were applied to the skin flaps.  Using Bovie electrocautery a myocutaneous flap was reflected anteriorly and self-retaining retractors were placed.  Using a high-speed drill and perforator, a large hemicraniectomy flap was created and gently elevated from the dura using a Penfield 1.  The flap was then stored in antibiotic saline on the back table.  Using Leksell rongeur, the inferior temporal lobe was followed down to the middle cranial fossa and this bone was removed.  The dura appeared significantly tense.  Using a 15 blade, durotomy was created.  Using Metzenbaum scissors the dura was created and the multiple leaflets to relieve pressure in the brain then began to significantly swell out of the craniectomy site.  The brain parenchyma appeared rather normal.  Upon palpation it seemed slightly soft therefore I did not elect to evacuate the hematoma (in the setting of needing CRRT and needing to be heparinized).  Epidural hemostasis was achieved with Surgifoam.  A large piece of DuraGen was then placed over the dural leaflets.  Retractors were removed.  The myocutaneous flap was hemostased with bipolar cautery.  The wound was copiously irrigated and noted to be excellently hemostatic.  Raney clips were removed and the galea was closed with 2-0 Vicryl sutures.  Skin was closed with staples. ? ?Using a 10 blade, a left paramedian abdominal incision was created through the dermis.  Using Bovie electrocautery, a pocket was created  to adequately fit the craniectomy bone flap.  Hemostasis was achieved with monopolar cautery.  This was placed into the wound.  It fit  appropriately.  The wound was noted to be excellently hemostatic.  Dermis was then closed with 2-0 Vicryl sutures and the skin was closed with staples.  Sterile dressings were applied. ? ?At the end of the case all sponge, needle, and instrument counts were correct. The patient was then transferred to the stretcher, remained intubated, in critical condition and return to the neuro ICU. ? ? ?

## 2022-02-20 NOTE — Progress Notes (Signed)
Dr. Tamala Julian at bedside and gave order to increase fluid removal by CRRT to 200 ml/H. MD also gave order to see if increasing fluid removal would help keep patient's SBP < 160 and if it does not then to start Cardene drip.  ?

## 2022-02-20 NOTE — Progress Notes (Signed)
Discussed RASS goal with Dr. Katrinka Blazing and MD gave order for RASS -1,-2.  Will wean down versed. Dr. Allena Katz came to see patient and discussed with MD that Dr. Katrinka Blazing had instructed RN to increase ultrafiltration to 200 ml/H. Dr. Genice Rouge increase in ultrafiltration on CRRT.  ?

## 2022-02-20 NOTE — Progress Notes (Signed)
Patient ID: Clinton Taylor, male   DOB: 1985-11-20, 36 y.o.   MRN: [id number] ?[facility] ?Progress Note  ? ?Assessment/ Plan:   ?1.  Acute kidney injury on chronic kidney disease stage IIIb (labs from last month showed creatinine 2.39): Based on the history and timeline of events, this appears to be hemodynamically mediated with recent contrast exposure and fluctuations in blood pressure likely resulting in ATN.  Continue CRRT and increase post filter bicarbonate to help with inducing metabolic alkalemia to compensate for respiratory acidosis and agree with increasing UF rate. ?2.  Hypernatremia: Medically induced with hypertonic saline to mitigate risk from cerebral edema.  Sodium level remains at 150 mEq/dL in keeping with efforts at reducing cerebral edema. ?3.  Hypertensive emergency with acute left-sided intracerebral hemorrhage: With right-sided hemiparesis at this time with ongoing efforts at blood pressure control at this time. ?4.  Acute hypoxic respiratory failure: On noninvasive positive pressure ventilation along with Decadron treatment for COVID-19 infection and community-acquired pneumonia on ceftriaxone and azithromycin. ?5.  Metabolic acidosis: Mild and likely associated with acute kidney injury, will continue to follow labs. ? ?Subjective:   ?Problems persisting with respiratory acidosis/ventilation difficulties.  Bradycardic earlier today  ? ?Objective:   ?BP (!) 170/60   Pulse (!) 54   Temp (!) 95.5 ?F (35.3 ?C)   Resp (!) 35   Ht 5\' 9"  (1.753 m)   Wt (!) 168 kg   SpO2 95%   BMI 54.69 kg/m?  ? ?Intake/Output Summary (Last 24 hours) at 02/16/2022 0959 ?Last data filed at 03/03/2022 0955 ?Gross per 24 hour  ?Intake 2510.51 ml  ?Output 3210 ml  ?Net -699.49 ml  ? ?Weight change: -2.7 kg ? ?Physical Exam: ?Gen: Intubated, sedated ?CVS: Pulse regular bradycardia, S1 and S2 normal ?Resp: Clear to auscultation bilaterally (posterior chest), no distinct rales or rhonchi.  On  ventilator ?Abd: Soft, obese, nontender, bowel sounds normal ?Ext: 1+ bilateral lower extremity edema, right-sided hemiparesis ? ?Imaging: ?DG Abd 1 View ? ?Result Date: 02/19/2022 ?CLINICAL DATA:  Nasogastric tube placement EXAM: ABDOMEN - 1 VIEW COMPARISON:  Portable exam 1130 hours compared to 02/18/2022 FINDINGS: Interval removal of nasogastric tube and placement of feeding tube. Tip of feeding tube projects over proximal to mid stomach. Nonobstructive bowel gas pattern. IMPRESSION: Tip of feeding tube projects over proximal to mid stomach. Electronically Signed   By: Lavonia Dana M.D.   On: 02/19/2022 11:41  ? ?DG CHEST PORT 1 VIEW ? ?Result Date: 02/21/2022 ?CLINICAL DATA:  Code stroke. COVID-19 positive. Evaluate lung status. EXAM: PORTABLE CHEST 1 VIEW COMPARISON:  02/19/2022 FINDINGS: Endotracheal tube has tip 4.2 cm above the carina. Right IJ central venous catheter unchanged. Left IJ central venous catheter has tip horizontal 2 obliquely oriented over the SVC unchanged. Enteric tube courses into the region of the stomach and off the film. Lungs are adequately inflated and demonstrate hazy asymmetric prominence of the pulmonary vessels left worse than right without significant change likely mild vascular congestion. Possible hazy opacification left base/retrocardiac region unchanged. No definite effusion. Cardiomediastinal silhouette and remainder of the exam is unchanged. IMPRESSION: 1. Stable hazy prominence of the pulmonary vessels left worse than right likely mild vascular congestion. Stable mild hazy left base opacification which may be atelectasis or early infection. 2. Tubes and lines as described. Electronically Signed   By: Marin Olp M.D.   On: 03/04/2022 08:33  ? ?DG Chest Port 1 View ? ?Result Date: 02/19/2022 ?CLINICAL DATA:  Intubation EXAM: PORTABLE  CHEST 1 VIEW COMPARISON:  Portable exam 1127 hours compared to 02/18/2022 FINDINGS: Tip of endotracheal tube 8.4 cm above carina. Feeding tube  extends into abdomen. RIGHT jugular catheter tip projects over SVC. Borderline enlargement of cardiac silhouette. Mediastinal contours normal. Hazy infiltrates mid to lower LEFT lung. No pleural effusion or pneumothorax. IMPRESSION: Hazy infiltrates mid to lower LEFT lung. Electronically Signed   By: Lavonia Dana M.D.   On: 02/19/2022 11:40  ? ?DG Chest Port 1 View ? ?Result Date: 02/18/2022 ?CLINICAL DATA:  Endotracheal tube. EXAM: PORTABLE CHEST 1 VIEW COMPARISON:  Chest x-ray 02/18/2022. FINDINGS: Examination is technically limited secondary to patient positioning. Endotracheal tube tip is 4 cm above the carina. There is a left-sided central venous catheter with distal tip projecting over the brachiocephalic SVC junction, unchanged. Enteric tube is only partially visualized. Cardiomediastinal silhouette is enlarged unchanged. There are increasing central airspace opacities. Right costophrenic angle and right lower lung have been excluded. Left costophrenic angle clear. No pneumothorax or acute fracture. IMPRESSION: 1. Cardiomegaly with increasing central opacities worrisome for pulmonary edema. Infection not excluded. Electronically Signed   By: Ronney Asters M.D.   On: 02/18/2022 19:43  ? ?DG CHEST PORT 1 VIEW ? ?Result Date: 02/18/2022 ?CLINICAL DATA:  Endotracheal tube and central line placement. EXAM: PORTABLE CHEST 1 VIEW COMPARISON:  02/15/2022 FINDINGS: Endotracheal tube is in place, tip approximately 7.5 centimeters above the carina. Nasogastric tube has been placed, tip beyond the edge of the image, at least to level of the distal esophagus. A LEFT IJ central line tip overlies the confluence of the superior vena cava with the LEFT brachiocephalic vein. The heart is enlarged. There are diffuse pulmonary infiltrates bilaterally, increased compared to prior study. Lung bases are partially excluded. There is no pneumothorax. IMPRESSION: Interval placement of endotracheal tube, nasogastric tube and LEFT central  line. No pneumothorax. Increased bilateral pulmonary infiltrates. Stable cardiac Electronically Signed   By: Nolon Nations M.D.   On: 02/18/2022 13:20  ? ?DG Abd Portable 1V ? ?Result Date: 02/18/2022 ?CLINICAL DATA:  Gastrointestinal tube. EXAM: PORTABLE ABDOMEN - 1 VIEW COMPARISON:  None Available. FINDINGS: Enteric tube tip is coiled in the body of the stomach. No dilated bowel loops are seen on this image. IMPRESSION: 1. Enteric tube tip is coiled in the body of the stomach. Electronically Signed   By: Ronney Asters M.D.   On: 02/18/2022 19:43   ? ?Labs: ?BMET ?Recent Labs  ?Lab 02/16/22 ?0331 02/16/22 ?[id number] 02/17/22 ?0433 02/17/22 ?1156 02/18/22 ?0411 02/18/22 ?1050 02/19/22 ?0518 02/19/22 ?1140 02/19/22 ?1152 02/19/22 ?1529 02/19/22 ?1616 02/19/22 ?1932 02/19/22 ?2235 02/19/22 ?2349 03/14/2022 ?0327 02/16/2022 ?0454  ?NA 154*   < > 161*   < > 160*   < > 159* 161* 160* 159* 157*  157* 154* 154* 150* 150* 150*  ?K 4.1  --  3.9  --  4.4   < > 6.6* 6.3* 6.2* 5.6* 5.6* 5.8*  --  5.3* 4.9 4.8  ?CL 126*  --  >130*  --  >130*  --  128* >130*  --   --  125*  --   --   --   --  107  ?CO2 19*  --  18*  --  18*  --  23 24  --   --  24  --   --   --   --  32  ?GLUCOSE 106*  --  99  --  130*  --  136* 98  --   --  103*  --   --   --   --  138*  ?BUN 34*  --  37*  --  37*  --  59* 64*  --   --  55*  --   --   --   --  58*  ?CREATININE 3.56*  --  3.72*  --  3.96*  --  6.28* 7.10*  --   --  6.24*  --   --   --   --  5.49*  ?CALCIUM 8.6*  --  8.9  --  8.9  --  8.2* 8.2*  --   --  8.1*  --   --   --   --  7.9*  ?PHOS  --   --   --   --   --   --  9.6*  --   --   --  8.0*  --   --   --   --  7.9*  ? < > = values in this interval not displayed.  ? ?CBC ?Recent Labs  ?Lab 02/17/22 ?0433 02/18/22 ?0411 02/18/22 ?1246 02/19/22 ?0230 02/19/22 ?0518 02/19/22 ?1152 02/19/22 ?1529 02/19/22 ?1932 02/19/22 ?2349 02/19/2022 ?0327  ?WBC 11.3* 8.4  --  11.0* 11.6*  --   --   --   --   --   ?NEUTROABS 8.9* 7.2  --  9.4* 9.5*  --   --   --   --    --   ?HGB 11.5* 11.8*   < > 11.3* 11.2*   < > 11.6* 11.2* 11.2* 11.2*  ?HCT 37.0* 38.5*   < > 39.8 40.7   < > 34.0* 33.0* 33.0* 33.0*  ?MCV 81.3 81.7  --  88.6 89.6  --   --   --   --   --   ?PLT 256 236  --  221 208  --   --

## 2022-02-21 ENCOUNTER — Inpatient Hospital Stay (HOSPITAL_COMMUNITY): Payer: BC Managed Care – PPO

## 2022-02-21 DIAGNOSIS — I610 Nontraumatic intracerebral hemorrhage in hemisphere, subcortical: Secondary | ICD-10-CM | POA: Diagnosis not present

## 2022-02-21 DIAGNOSIS — N170 Acute kidney failure with tubular necrosis: Secondary | ICD-10-CM | POA: Diagnosis not present

## 2022-02-21 LAB — POCT I-STAT 7, (LYTES, BLD GAS, ICA,H+H)
Acid-Base Excess: 5 mmol/L — ABNORMAL HIGH (ref 0.0–2.0)
Bicarbonate: 32.2 mmol/L — ABNORMAL HIGH (ref 20.0–28.0)
Calcium, Ion: 1 mmol/L — ABNORMAL LOW (ref 1.15–1.40)
HCT: 29 % — ABNORMAL LOW (ref 39.0–52.0)
Hemoglobin: 9.9 g/dL — ABNORMAL LOW (ref 13.0–17.0)
O2 Saturation: 95 %
Patient temperature: 98.6
Potassium: 4.1 mmol/L (ref 3.5–5.1)
Sodium: 148 mmol/L — ABNORMAL HIGH (ref 135–145)
TCO2: 34 mmol/L — ABNORMAL HIGH (ref 22–32)
pCO2 arterial: 64.2 mmHg — ABNORMAL HIGH (ref 32–48)
pH, Arterial: 7.309 — ABNORMAL LOW (ref 7.35–7.45)
pO2, Arterial: 87 mmHg (ref 83–108)

## 2022-02-21 LAB — RENAL FUNCTION PANEL
Albumin: 2.5 g/dL — ABNORMAL LOW (ref 3.5–5.0)
Albumin: 2.5 g/dL — ABNORMAL LOW (ref 3.5–5.0)
Anion gap: 11 (ref 5–15)
Anion gap: 14 (ref 5–15)
BUN: 81 mg/dL — ABNORMAL HIGH (ref 6–20)
BUN: 103 mg/dL — ABNORMAL HIGH (ref 6–20)
CO2: 33 mmol/L — ABNORMAL HIGH (ref 22–32)
CO2: 32 mmol/L (ref 22–32)
Calcium: 7.9 mg/dL — ABNORMAL LOW (ref 8.9–10.3)
Calcium: 8 mg/dL — ABNORMAL LOW (ref 8.9–10.3)
Chloride: 104 mmol/L (ref 98–111)
Chloride: 101 mmol/L (ref 98–111)
Creatinine, Ser: 6.07 mg/dL — ABNORMAL HIGH (ref 0.61–1.24)
Creatinine, Ser: 7.08 mg/dL — ABNORMAL HIGH (ref 0.61–1.24)
GFR, Estimated: 11 mL/min — ABNORMAL LOW (ref >60)
GFR, Estimated: 9 mL/min — ABNORMAL LOW (ref >60)
Glucose, Bld: 181 mg/dL — ABNORMAL HIGH (ref 70–99)
Glucose, Bld: 162 mg/dL — ABNORMAL HIGH (ref 70–99)
Phosphorus: 8.7 mg/dL — ABNORMAL HIGH (ref 2.5–4.6)
Phosphorus: 9 mg/dL — ABNORMAL HIGH (ref 2.5–4.6)
Potassium: 4.7 mmol/L (ref 3.5–5.1)
Potassium: 4.3 mmol/L (ref 3.5–5.1)
Sodium: 148 mmol/L — ABNORMAL HIGH (ref 135–145)
Sodium: 147 mmol/L — ABNORMAL HIGH (ref 135–145)

## 2022-02-21 LAB — PHOSPHORUS
Phosphorus: 8.7 mg/dL — ABNORMAL HIGH (ref 2.5–4.6)
Phosphorus: 8.9 mg/dL — ABNORMAL HIGH (ref 2.5–4.6)

## 2022-02-21 LAB — HEPATIC FUNCTION PANEL
ALT: 47 U/L — ABNORMAL HIGH (ref 0–44)
AST: 43 U/L — ABNORMAL HIGH (ref 15–41)
Albumin: 2.6 g/dL — ABNORMAL LOW (ref 3.5–5.0)
Alkaline Phosphatase: 48 U/L (ref 38–126)
Bilirubin, Direct: 0.2 mg/dL (ref 0.0–0.2)
Indirect Bilirubin: 0.6 mg/dL (ref 0.3–0.9)
Total Bilirubin: 0.8 mg/dL (ref 0.3–1.2)
Total Protein: 6 g/dL — ABNORMAL LOW (ref 6.5–8.1)

## 2022-02-21 LAB — MAGNESIUM
Magnesium: 2.6 mg/dL — ABNORMAL HIGH (ref 1.7–2.4)
Magnesium: 2.8 mg/dL — ABNORMAL HIGH (ref 1.7–2.4)
Magnesium: 2.9 mg/dL — ABNORMAL HIGH (ref 1.7–2.4)

## 2022-02-21 LAB — GLUCOSE, CAPILLARY
Glucose-Capillary: 174 mg/dL — ABNORMAL HIGH (ref 70–99)
Glucose-Capillary: 160 mg/dL — ABNORMAL HIGH (ref 70–99)
Glucose-Capillary: 148 mg/dL — ABNORMAL HIGH (ref 70–99)
Glucose-Capillary: 156 mg/dL — ABNORMAL HIGH (ref 70–99)
Glucose-Capillary: 192 mg/dL — ABNORMAL HIGH (ref 70–99)
Glucose-Capillary: 181 mg/dL — ABNORMAL HIGH (ref 70–99)

## 2022-02-21 LAB — CBC
HCT: 31 % — ABNORMAL LOW (ref 39.0–52.0)
Hemoglobin: 9.8 g/dL — ABNORMAL LOW (ref 13.0–17.0)
MCH: 25.9 pg — ABNORMAL LOW (ref 26.0–34.0)
MCHC: 31.6 g/dL (ref 30.0–36.0)
MCV: 81.8 fL (ref 80.0–100.0)
Platelets: 121 10*3/uL — ABNORMAL LOW (ref 150–400)
RBC: 3.79 MIL/uL — ABNORMAL LOW (ref 4.22–5.81)
RDW: 17.3 % — ABNORMAL HIGH (ref 11.5–15.5)
WBC: 12.1 10*3/uL — ABNORMAL HIGH (ref 4.0–10.5)
nRBC: 0.7 % — ABNORMAL HIGH (ref 0.0–0.2)

## 2022-02-21 LAB — TRIGLYCERIDES: Triglycerides: 335 mg/dL — ABNORMAL HIGH (ref <150)

## 2022-02-21 LAB — SODIUM
Sodium: 149 mmol/L — ABNORMAL HIGH (ref 135–145)
Sodium: 148 mmol/L — ABNORMAL HIGH (ref 135–145)
Sodium: 146 mmol/L — ABNORMAL HIGH (ref 135–145)

## 2022-02-21 MED ORDER — CALCIUM GLUCONATE-NACL 2-0.675 GM/100ML-% IV SOLN
2.0000 g | Freq: Once | INTRAVENOUS | Status: AC
  Administered 2022-02-21: 2000 mg via INTRAVENOUS
  Filled 2022-02-21: qty 100

## 2022-02-21 MED ORDER — HYDRALAZINE HCL 20 MG/ML IJ SOLN: 10.0000 mg | INTRAMUSCULAR | Status: DC | PRN

## 2022-02-21 MED ORDER — CLONIDINE HCL 0.2 MG PO TABS
0.2000 mg | ORAL_TABLET | Freq: Four times a day (QID) | ORAL | Status: DC
Start: 2022-02-21 — End: 2022-02-21

## 2022-02-21 MED ORDER — MINOXIDIL 2.5 MG PO TABS
5.0000 mg | ORAL_TABLET | Freq: Every day | ORAL | Status: DC
  Administered 2022-02-21 – 2022-02-22 (×2): 5 mg
  Filled 2022-02-21 (×3): qty 2

## 2022-02-21 MED ORDER — SODIUM CHLORIDE 0.9 % IV SOLN
2.0000 g | Freq: Once | INTRAVENOUS | Status: AC
  Administered 2022-02-21: 2 g via INTRAVENOUS
  Filled 2022-02-21: qty 20

## 2022-02-21 MED ORDER — FUROSEMIDE 10 MG/ML IJ SOLN
80.0000 mg | Freq: Once | INTRAMUSCULAR | Status: AC
Start: 2022-02-21 — End: 2022-02-21
  Administered 2022-02-21: 80 mg via INTRAVENOUS

## 2022-02-21 MED ORDER — FUROSEMIDE 10 MG/ML IJ SOLN
INTRAMUSCULAR | Status: AC
  Filled 2022-02-21: qty 8

## 2022-02-21 MED ORDER — CLONIDINE HCL 0.1 MG PO TABS
0.1000 mg | ORAL_TABLET | Freq: Three times a day (TID) | ORAL | Status: DC
  Administered 2022-02-21: 0.1 mg via ORAL
  Filled 2022-02-21: qty 1

## 2022-02-21 MED ORDER — DOXAZOSIN MESYLATE 2 MG PO TABS
2.0000 mg | ORAL_TABLET | Freq: Every day | ORAL | Status: DC
  Administered 2022-02-21: 2 mg via ORAL
  Filled 2022-02-21 (×2): qty 1

## 2022-02-21 MED ORDER — CLONIDINE HCL 0.2 MG PO TABS
0.2000 mg | ORAL_TABLET | Freq: Three times a day (TID) | ORAL | Status: DC
  Administered 2022-02-21: 0.2 mg
  Filled 2022-02-21: qty 1

## 2022-02-21 MED ORDER — CLONIDINE ORAL SUSPENSION 10 MCG/ML: 0.2000 mg | Freq: Three times a day (TID) | ORAL | Status: DC

## 2022-02-21 NOTE — Progress Notes (Signed)
Patient transported to CT and back to 4N18 via the ventilator with no complications.  ?

## 2022-02-21 NOTE — Plan of Care (Signed)
Na is now 148. Hypertonic saline infusion was stopped previously, but required an emergent bolus of 23.4% yesterday a few hours before going to surgery. He is now s/p hemicraniectomy. Verbal order placed to change prior Na goal to normalized Na levels (no longer requires hypertonic saline given that he is s/p left hemicraniectomy).  ? ?Electronically signed: Dr. Caryl Pina ? ?

## 2022-02-21 NOTE — Progress Notes (Addendum)
STROKE TEAM PROGRESS NOTE  ? ?INTERVAL HISTORY ?  Patient still intubated on sedation.   ?Emergency craniotomy yesterday after CTH revealed worsening shift. Difficulty with controlling his BP this am.  ?CCM managing BP,dialysis on hold for 24 hrs due to hemicrani. ?Discussed with CCM NP to add clonidine for BP control as nicardipine maxed out. They will add minoxidil and doxazosin today. Unable to do cleviprex. Not responding to labetalol pushes. CCM planning to hold steroids.  ? ?Vitals:  ? 02/21/22 1045 02/21/22 1130 02/21/22 1135 02/21/22 1200  ?BP:  (!) 161/64 (!) 153/57   ?Pulse: (!) 57  60 61  ?Resp: (!) 22  (!) 31 (!) 22  ?Temp:      ?TempSrc:      ?SpO2: 95%  95% 94%  ?Weight:      ?Height:      ? ?CBC:  ?Recent Labs  ?Lab 02/19/22 ?0230 02/19/22 ?0518 02/19/22 ?1152 02/21/22 ?[id number] 02/21/22 ?[id number]  ?WBC 11.0* 11.6*  --  12.1*  --   ?NEUTROABS 9.4* 9.5*  --   --   --   ?HGB 11.3* 11.2*   < > 9.8* 9.9*  ?HCT 39.8 40.7   < > 31.0* 29.0*  ?MCV 88.6 89.6  --  81.8  --   ?PLT 221 208  --  121*  --   ? < > = values in this interval not displayed.  ? ? ?Basic Metabolic Panel:  ?Recent Labs  ?Lab 02/16/2022 ?1600 03/02/2022 ?2254 02/21/22 ?0436 02/21/22 ?0839 02/21/22 ?1139  ?NA 145  145   < > 149*  148* 148* 148*  ?K 3.8  --  4.7 4.1  --   ?CL 97*  --  104  --   --   ?CO2 36*  --  33*  --   --   ?GLUCOSE 157*  --  181*  --   --   ?BUN 60*  --  81*  --   --   ?CREATININE 5.18*  --  6.07*  --   --   ?CALCIUM 7.8*  --  7.9*  --   --   ?MG 2.4  --  2.8*  2.6*  --   --   ?PHOS 8.0*  8.1*  --  8.7*  8.7*  --   --   ? < > = values in this interval not displayed.  ? ? ?Lipid Panel:  ?Recent Labs  ?Lab 02/21/22 ?0436  ?TRIG 335*  ? ? ?HgbA1c:  ?No results for input(s): HGBA1C in the last 168 hours. ? ?Urine Drug Screen:  ?No results for input(s): LABOPIA, COCAINSCRNUR, LABBENZ, AMPHETMU, THCU, LABBARB in the last 168 hours. ?  ?Alcohol Level  ?No results for input(s): ETH in the last 168 hours. ? ? ?IMAGING past 24 hours ?CT  HEAD WO CONTRAST (5MM) ? ?Result Date: 02/21/2022 ?CLINICAL DATA:  36 year old male code stroke presentation with left deep gray matter hemorrhage. 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 EXAM: CT HEAD WITHOUT CONTRAST TECHNIQUE: Contiguous axial images were obtained from the base of the skull through the vertex without intravenous contrast. RADIATION DOSE REDUCTION: This exam was performed according to the departmental dose-optimization program which includes automated exposure control, adjustment of the mA and/or kV according to patient size and/or use of iterative reconstruction technique. COMPARISON:  Head CT 03/10/2022 and earlier. FINDINGS: Brain: Oval and lobulated intra-axial hemorrhage in the left hemisphere centered at the lentiform encompasses 58 by 39 x 51  mm (AP by transverse by CC), estimated volume 58 mL not significantly changed from yesterday. Surrounding edema. Ongoing mass effect on the left lateral ventricle. But following craniectomy right side midline shift has decreased to 8-9 mm (previously 11 mm). Basilar cisterns are mildly improved as seen on sagittal image 34. No ventriculomegaly. Overall stable gray-white matter differentiation. Vascular: Mild Calcified atherosclerosis at the skull base. Skull: New left frontotemporal craniectomy. No other acute osseous abnormality. Sinuses/Orbits: Bilateral paranasal sinus fluid levels and mucosal thickening not significantly changed. Left nasoenteric tube remains in place. Right greater than left middle ear and mastoid opacification unchanged. Other: Intubated on the scout view. Orbits soft tissues remain within normal limits. New postoperative changes to the left scalp with overlying skin staples. Scalp hematoma and small volume of postoperative gas. IMPRESSION: 1. New left side craniectomy with midline shift decreased to 8-9 mL, and improved basilar cisterns. 2. Stable left hemisphere intra-axial hemorrhage  (estimated at 58 mL) with regional edema and mass effect on the left lateral ventricle. 3. No new intracranial abnormality. Electronically Signed   By: Odessa Fleming M.D.   On: 02/21/2022 06:49  ? ?CT HEAD WO CONTRAST ( ) ? ?Result Date: 02-23-22 ?CLINICAL DATA:  Follow-up hemorrhagic stroke EXAM: CT HEAD WITHOUT CONTRAST TECHNIQUE: Contiguous axial images were obtained from the base of the skull through the vertex without intravenous contrast. RADIATION DOSE REDUCTION: This exam was performed according to the departmental dose-optimization program which includes automated exposure control, adjustment of the mA and/or kV according to patient size and/or use of iterative reconstruction technique. COMPARISON:  02/18/2022 FINDINGS: Brain: No abnormality is seen affecting the brainstem or cerebellum. Right cerebral hemisphere remains normal. No change in size of an intraparenchymal hemorrhage in the left basal ganglia and radiating white matter tracts measuring 3.8 x 6.1 by 4.4 cm. Surrounding edema may have increased minimally. Mass effect with left-to-right shift of 11 mm. This is increased 2-3 mm since the previous study. No evidence of ventricular trapping. Vascular: No abnormal vascular finding. Skull: Negative Sinuses/Orbits: Mucosal thickening with layering fluid in the sphenoid sinus. Orbits negative. Other: None IMPRESSION: No change in size of the left basal ganglia/radiating white matter tract intraparenchymal hemorrhage. Slight increase in the amount of surrounding edema. Slight increase in mass effect with left-to-right shift of 11 mm. No evidence of ventricular trapping no new or additional bleeding. Electronically Signed   By: Paulina Fusi M.D.   On: 02-23-2022 14:51  ? ?DG CHEST PORT 1 VIEW ? ?Result Date: 02/21/2022 ?CLINICAL DATA:  Multifocal pneumonia EXAM: PORTABLE CHEST 1 VIEW COMPARISON:  Chest x-ray dated Feb 23, 2022 FINDINGS: Cardiac and mediastinal contours are unchanged. Stable position of ET tube,  feeding tube, and bilateral central venous lines. Improved aeration of the left lung base. No large pleural effusion or pneumothorax. IMPRESSION: 1. Improved aeration of the left lung base, likely due to decreased atelectasis. 2. Stable support devices. Electronically Signed   By: Allegra Lai M.D.   On: 02/21/2022 09:04   ? ?PHYSICAL EXAM ? ?Temp:  [97.5 ?F (36.4 ?C)-98.9 ?F (37.2 ?C)] 98.6 ?F (37 ?C) (05/07 0751) ?Pulse Rate:  [47-73] 61 (05/07 1200) ?Resp:  [16-35] 22 (05/07 1200) ?BP: (118-185)/(50-82) 153/57 (05/07 1135) ?SpO2:  [90 %-100 %] 94 % (05/07 1200) ?Arterial Line BP: (141-182)/(40-81) 170/66 (05/07 1200) ?FiO2 (%):  [50 %] 50 % (05/07 0751) ?Weight:  [165.3 kg] 165.3 kg (05/07 0500) ? ?General - overweight, intubated on sedation. ? ?Cardiovascular - Regular rate and rhythm. ? ?  Neuro - intubated on sedation.  ?Exam consistent with sedation. Eye closed. Does not follow commands.  pupil equal size, sluggish to light. Corneal reflex weakly present, gag and cough present. Breathing over the vent.  On pain stimulation, no  movement in all extremities. Toes mute. ? ? ?ASSESSMENT/PLAN ?[name] is a 36 y.o. male with history of HTN, HLD, obesity, smoking, OSA on CPAP and mood disorder presenting with acute right hemiplegia with right sided numbness.  He was taken to [facility], was found to have an ICH and was transferred here for further management.  ICH score is 1.  Head CT last night reveals increasing left to right midline shift to 49mm without hydrocephalus.  3% HTS is off now due to Na 160.  Patient has confirmed covid-19 infection.  His wife reports that he smokes cigarettes and is not always compliant with his medical therapy at home. ?Hemicrani after Repeat HCT 5/6 showed 27mm shift. Did not evacuate hematoma due to clinical deficits. BP difficult to control. ? ?ICH:  left large ICH involving corona radiata, lentiform nucleus and subinsular white matter with 76mm left to right midline  shift  ?CT head IPH involving left corona radiata, left lentiform nucleus and subinsular white matter with regional mass effect and 7mm midline shift ?CTA Head and Neck Nondiagnostic exam ?Follow up CT 4/29

## 2022-02-21 NOTE — Progress Notes (Signed)
Pt was transported to CT via ventilator with no apparent complications. RT will continue to monitor. ?

## 2022-02-21 NOTE — Progress Notes (Signed)
This RN spoke with MRI about getting patient down there. This RN was told that they are unable to this shift. In addition, respiratory therapy has limited staff this shift per RT, making [facility] difficult on their end. NP Glenford Peers updated. Verbal orders for head CT.  ?

## 2022-02-21 NOTE — Plan of Care (Signed)
Plan of care reviewed with wife at bedside ? ?

## 2022-02-21 NOTE — Progress Notes (Signed)
Patient ID: Clinton Taylor, male   DOB: December 27, 1985, 36 y.o.   MRN: [id number] ?[facility] ?Progress Note  ? ?Assessment/ Plan:   ?1.  Acute kidney injury on chronic kidney disease stage IIIb (labs from last month showed creatinine 2.39): Acute kidney injury likely from ischemic ATN (hemodynamic fluctuations) +/- contrast nephropathy.  Started on CRRT after he developed worsening renal function and multiple electrolyte abnormalities in the setting of worsening respiratory failure.  Yesterday taken off of CRRT to undergo left hemicraniectomy with the intention of leaving him off for another 24 hours. ?2.  Hypernatremia: Medically induced with hypertonic saline to mitigate risk from cerebral edema.  Status post hemicraniectomy and neurology note reviewed from yesterday to now allow more sodium after transient hypertonic saline administered preoperatively. ?3.  Hypertensive emergency with acute left-sided intracerebral hemorrhage: With acute decompensation yesterday due to worsening mass effect with left-to-right shift and underwent emergent left hemicraniectomy yesterday to relieve intracranial pressure.  Did not undergo evacuation of hematoma based on physical exam intraoperatively.  Will add doxazosin for additional blood pressure control. ?4.  Acute hypoxic respiratory failure: On noninvasive positive pressure ventilation along with Decadron treatment for COVID-19 infection and community-acquired pneumonia on ceftriaxone and azithromycin. ? ?Subjective:   ?Struggles with blood pressure control noted overnight status post left hemicraniectomy.  ? ?Objective:   ?BP (!) 185/82   Pulse 63   Temp 98.6 ?F (37 ?C) (Oral)   Resp (!) 25   Ht 5\' 9"  (1.753 m)   Wt (!) 165.3 kg   SpO2 95%   BMI 53.82 kg/m?  ? ?Intake/Output Summary (Last 24 hours) at 02/21/2022 1015 ?Last data filed at 02/21/2022 [id number] ?Gross per 24 hour  ?Intake 3200.28 ml  ?Output 1278 ml  ?Net 1922.28 ml  ? ?Weight change: -2.7  kg ? ?Physical Exam: ?Gen: Intubated, sedated, status post left hemicraniectomy ?CVS: Pulse regular bradycardia, S1 and S2 normal ?Resp: Clear to auscultation bilaterally (posterior chest), no distinct rales or rhonchi.  On ventilator ?Abd: Soft, obese, nontender, bowel sounds normal ?Ext: 1+ bilateral lower extremity edema, right-sided hemiparesis ? ?Imaging: ?DG Abd 1 View ? ?Result Date: 02/19/2022 ?CLINICAL DATA:  Nasogastric tube placement EXAM: ABDOMEN - 1 VIEW COMPARISON:  Portable exam 1130 hours compared to 02/18/2022 FINDINGS: Interval removal of nasogastric tube and placement of feeding tube. Tip of feeding tube projects over proximal to mid stomach. Nonobstructive bowel gas pattern. IMPRESSION: Tip of feeding tube projects over proximal to mid stomach. Electronically Signed   By: Lavonia Dana M.D.   On: 02/19/2022 11:41  ? ?CT HEAD WO CONTRAST (5MM) ? ?Result Date: 02/21/2022 ?CLINICAL DATA:  36 year old male code stroke presentation with left deep gray matter hemorrhage. 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 EXAM: CT HEAD WITHOUT CONTRAST TECHNIQUE: Contiguous axial images were obtained from the base of the skull through the vertex without intravenous contrast. RADIATION DOSE REDUCTION: This exam was performed according to the departmental dose-optimization program which includes automated exposure control, adjustment of the mA and/or kV according to patient size and/or use of iterative reconstruction technique. COMPARISON:  Head CT 03/04/2022 and earlier. FINDINGS: Brain: Oval and lobulated intra-axial hemorrhage in the left hemisphere centered at the lentiform encompasses 58 by 39 x 51 mm (AP by transverse by CC), estimated volume 58 mL not significantly changed from yesterday. Surrounding edema. Ongoing mass effect on the left lateral ventricle. But following craniectomy right side midline shift has decreased to 8-9 mm (previously 11  mm). Basilar cisterns are  mildly improved as seen on sagittal image 34. No ventriculomegaly. Overall stable gray-white matter differentiation. Vascular: Mild Calcified atherosclerosis at the skull base. Skull: New left frontotemporal craniectomy. No other acute osseous abnormality. Sinuses/Orbits: Bilateral paranasal sinus fluid levels and mucosal thickening not significantly changed. Left nasoenteric tube remains in place. Right greater than left middle ear and mastoid opacification unchanged. Other: Intubated on the scout view. Orbits soft tissues remain within normal limits. New postoperative changes to the left scalp with overlying skin staples. Scalp hematoma and small volume of postoperative gas. IMPRESSION: 1. New left side craniectomy with midline shift decreased to 8-9 mL, and improved basilar cisterns. 2. Stable left hemisphere intra-axial hemorrhage (estimated at 58 mL) with regional edema and mass effect on the left lateral ventricle. 3. No new intracranial abnormality. Electronically Signed   By: Genevie Ann M.D.   On: 02/21/2022 06:49  ? ?CT HEAD WO CONTRAST (5MM) ? ?Result Date: 03/15/2022 ?CLINICAL DATA:  Follow-up hemorrhagic stroke EXAM: CT HEAD WITHOUT CONTRAST TECHNIQUE: Contiguous axial images were obtained from the base of the skull through the vertex without intravenous contrast. RADIATION DOSE REDUCTION: This exam was performed according to the departmental dose-optimization program which includes automated exposure control, adjustment of the mA and/or kV according to patient size and/or use of iterative reconstruction technique. COMPARISON:  02/18/2022 FINDINGS: Brain: No abnormality is seen affecting the brainstem or cerebellum. Right cerebral hemisphere remains normal. No change in size of an intraparenchymal hemorrhage in the left basal ganglia and radiating white matter tracts measuring 3.8 x 6.1 by 4.4 cm. Surrounding edema may have increased minimally. Mass effect with left-to-right shift of 11 mm. This is increased  2-3 mm since the previous study. No evidence of ventricular trapping. Vascular: No abnormal vascular finding. Skull: Negative Sinuses/Orbits: Mucosal thickening with layering fluid in the sphenoid sinus. Orbits negative. Other: None IMPRESSION: No change in size of the left basal ganglia/radiating white matter tract intraparenchymal hemorrhage. Slight increase in the amount of surrounding edema. Slight increase in mass effect with left-to-right shift of 11 mm. No evidence of ventricular trapping no new or additional bleeding. Electronically Signed   By: Nelson Chimes M.D.   On: 02/26/2022 14:51  ? ?DG CHEST PORT 1 VIEW ? ?Result Date: 02/21/2022 ?CLINICAL DATA:  Multifocal pneumonia EXAM: PORTABLE CHEST 1 VIEW COMPARISON:  Chest x-ray dated Feb 20, 2022 FINDINGS: Cardiac and mediastinal contours are unchanged. Stable position of ET tube, feeding tube, and bilateral central venous lines. Improved aeration of the left lung base. No large pleural effusion or pneumothorax. IMPRESSION: 1. Improved aeration of the left lung base, likely due to decreased atelectasis. 2. Stable support devices. Electronically Signed   By: Yetta Glassman M.D.   On: 02/21/2022 09:04  ? ?DG CHEST PORT 1 VIEW ? ?Result Date: 02/15/2022 ?CLINICAL DATA:  Code stroke. COVID-19 positive. Evaluate lung status. EXAM: PORTABLE CHEST 1 VIEW COMPARISON:  02/19/2022 FINDINGS: Endotracheal tube has tip 4.2 cm above the carina. Right IJ central venous catheter unchanged. Left IJ central venous catheter has tip horizontal 2 obliquely oriented over the SVC unchanged. Enteric tube courses into the region of the stomach and off the film. Lungs are adequately inflated and demonstrate hazy asymmetric prominence of the pulmonary vessels left worse than right without significant change likely mild vascular congestion. Possible hazy opacification left base/retrocardiac region unchanged. No definite effusion. Cardiomediastinal silhouette and remainder of the exam is  unchanged. IMPRESSION: 1. Stable hazy prominence of the pulmonary vessels  left worse than right likely mild vascular congestion. Stable mild hazy left base opacification which may be atelectasis or early infection. 2. Tub

## 2022-02-21 NOTE — Progress Notes (Signed)
This RN contacted CCM, neurosurgery, and neurology to make aware of patient's sodium of 148. Previous notes before patient's surgery said Na goal of 160. Dr. Otelia Limes of neurology said to allow Na levels to normalize, no longer goal of 160. See MD note. No orders from CCM or neurosurgery.  ?
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
## 2022-02-21 NOTE — Progress Notes (Signed)
? ?  Providing Compassionate, Quality Care - Together ? ?NEUROSURGERY PROGRESS NOTE ? ? ?S: Difficulty with blood pressure control overnight and this morning. ? ?O: EXAM:  ?BP (!) 185/82   Pulse 61   Temp 98.6 ?F (37 ?C) (Oral)   Resp 20   Ht 5\' 9"  (1.753 m)   Wt (!) 165.3 kg   SpO2 95%   BMI 53.82 kg/m?  ? ?Intubated, sedated ?Eyes closed ?Breathing over the vent ?Pupils equally round reactive to light bilaterally ?Positive corneal bilaterally ?Minimal withdrawal left upper extremity to pain ?Left craniectomy flap full but soft ?Incisions clean dry and intact ?No motor response right upper/lower extremity ? ?ASSESSMENT:  ?36 y.o. male with  ? 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 ?-Status post left hemicraniectomy on Mar 03, 2022 ? ?PLAN: ?-Continue supportive care ?-CT this a.m. reviewed, improvement in midline shift.  Intracerebral hematoma appears stable ?-Recommend holding CRRT for minimum of 24 hours from surgery due to heparinization. ?-Okay to increase sedation for blood pressure support, prefer SBP less than 160 ? ? ? ?Thank you for allowing me to participate in this patient's care.  Please do not hesitate to call with questions or concerns. ? ? ?Sharee Sturdy, DO ?Neurosurgeon ?[facility] ?Cell: [phone] ? ?

## 2022-02-21 NOTE — Final Progress Note (Addendum)
Patient to MRI with respiratory and nurse at 1215 returned at 1415 ?Pt tolerated well  ? ?

## 2022-02-22 ENCOUNTER — Encounter (HOSPITAL_COMMUNITY): Payer: Self-pay | Admitting: Neurological Surgery

## 2022-02-22 DIAGNOSIS — U071 COVID-19: Secondary | ICD-10-CM | POA: Diagnosis not present

## 2022-02-22 DIAGNOSIS — I10 Essential (primary) hypertension: Secondary | ICD-10-CM | POA: Diagnosis not present

## 2022-02-22 DIAGNOSIS — I610 Nontraumatic intracerebral hemorrhage in hemisphere, subcortical: Secondary | ICD-10-CM | POA: Diagnosis not present

## 2022-02-22 DIAGNOSIS — N170 Acute kidney failure with tubular necrosis: Secondary | ICD-10-CM | POA: Diagnosis not present

## 2022-02-22 LAB — RENAL FUNCTION PANEL
Albumin: 2.9 g/dL — ABNORMAL LOW (ref 3.5–5.0)
Albumin: 3 g/dL — ABNORMAL LOW (ref 3.5–5.0)
Anion gap: 15 (ref 5–15)
Anion gap: 15 (ref 5–15)
BUN: 86 mg/dL — ABNORMAL HIGH (ref 6–20)
BUN: 71 mg/dL — ABNORMAL HIGH (ref 6–20)
CO2: 38 mmol/L — ABNORMAL HIGH (ref 22–32)
CO2: 42 mmol/L — ABNORMAL HIGH (ref 22–32)
Calcium: 7.6 mg/dL — ABNORMAL LOW (ref 8.9–10.3)
Calcium: 7.9 mg/dL — ABNORMAL LOW (ref 8.9–10.3)
Chloride: 90 mmol/L — ABNORMAL LOW (ref 98–111)
Chloride: 83 mmol/L — ABNORMAL LOW (ref 98–111)
Creatinine, Ser: 5.92 mg/dL — ABNORMAL HIGH (ref 0.61–1.24)
Creatinine, Ser: 4.77 mg/dL — ABNORMAL HIGH (ref 0.61–1.24)
GFR, Estimated: 12 mL/min — ABNORMAL LOW (ref >60)
GFR, Estimated: 15 mL/min — ABNORMAL LOW (ref >60)
Glucose, Bld: 150 mg/dL — ABNORMAL HIGH (ref 70–99)
Glucose, Bld: 158 mg/dL — ABNORMAL HIGH (ref 70–99)
Phosphorus: 6.6 mg/dL — ABNORMAL HIGH (ref 2.5–4.6)
Phosphorus: 6.6 mg/dL — ABNORMAL HIGH (ref 2.5–4.6)
Potassium: 3.7 mmol/L (ref 3.5–5.1)
Potassium: 3.5 mmol/L (ref 3.5–5.1)
Sodium: 143 mmol/L (ref 135–145)
Sodium: 140 mmol/L (ref 135–145)

## 2022-02-22 LAB — POCT I-STAT 7, (LYTES, BLD GAS, ICA,H+H)
Acid-Base Excess: 11 mmol/L — ABNORMAL HIGH (ref 0.0–2.0)
Acid-Base Excess: 18 mmol/L — ABNORMAL HIGH (ref 0.0–2.0)
Acid-Base Excess: 19 mmol/L — ABNORMAL HIGH (ref 0.0–2.0)
Bicarbonate: 39.7 mmol/L — ABNORMAL HIGH (ref 20.0–28.0)
Bicarbonate: 46.1 mmol/L — ABNORMAL HIGH (ref 20.0–28.0)
Bicarbonate: 46.7 mmol/L — ABNORMAL HIGH (ref 20.0–28.0)
Calcium, Ion: 1.04 mmol/L — ABNORMAL LOW (ref 1.15–1.40)
Calcium, Ion: 0.99 mmol/L — ABNORMAL LOW (ref 1.15–1.40)
Calcium, Ion: 0.96 mmol/L — ABNORMAL LOW (ref 1.15–1.40)
HCT: 41 % (ref 39.0–52.0)
HCT: 30 % — ABNORMAL LOW (ref 39.0–52.0)
HCT: 30 % — ABNORMAL LOW (ref 39.0–52.0)
Hemoglobin: 13.9 g/dL (ref 13.0–17.0)
Hemoglobin: 10.2 g/dL — ABNORMAL LOW (ref 13.0–17.0)
Hemoglobin: 10.2 g/dL — ABNORMAL LOW (ref 13.0–17.0)
O2 Saturation: 90 %
O2 Saturation: 100 %
O2 Saturation: 98 %
Patient temperature: 96
Patient temperature: 98.7
Potassium: 4.1 mmol/L (ref 3.5–5.1)
Potassium: 3.5 mmol/L (ref 3.5–5.1)
Potassium: 3.4 mmol/L — ABNORMAL LOW (ref 3.5–5.1)
Sodium: 147 mmol/L — ABNORMAL HIGH (ref 135–145)
Sodium: 141 mmol/L (ref 135–145)
Sodium: 139 mmol/L (ref 135–145)
TCO2: 42 mmol/L — ABNORMAL HIGH (ref 22–32)
TCO2: 48 mmol/L — ABNORMAL HIGH (ref 22–32)
TCO2: 49 mmol/L — ABNORMAL HIGH (ref 22–32)
pCO2 arterial: 68.7 mmHg (ref 32–48)
pCO2 arterial: 73.1 mmHg (ref 32–48)
pCO2 arterial: 70.3 mmHg (ref 32–48)
pH, Arterial: 7.363 (ref 7.35–7.45)
pH, Arterial: 7.408 (ref 7.35–7.45)
pH, Arterial: 7.43 (ref 7.35–7.45)
pO2, Arterial: 59 mmHg — ABNORMAL LOW (ref 83–108)
pO2, Arterial: 255 mmHg — ABNORMAL HIGH (ref 83–108)
pO2, Arterial: 101 mmHg (ref 83–108)

## 2022-02-22 LAB — CBC
HCT: 29.5 % — ABNORMAL LOW (ref 39.0–52.0)
Hemoglobin: 9.3 g/dL — ABNORMAL LOW (ref 13.0–17.0)
MCH: 25.2 pg — ABNORMAL LOW (ref 26.0–34.0)
MCHC: 31.5 g/dL (ref 30.0–36.0)
MCV: 79.9 fL — ABNORMAL LOW (ref 80.0–100.0)
Platelets: 142 10*3/uL — ABNORMAL LOW (ref 150–400)
RBC: 3.69 MIL/uL — ABNORMAL LOW (ref 4.22–5.81)
RDW: 16.9 % — ABNORMAL HIGH (ref 11.5–15.5)
WBC: 13.9 10*3/uL — ABNORMAL HIGH (ref 4.0–10.5)
nRBC: 0.5 % — ABNORMAL HIGH (ref 0.0–0.2)

## 2022-02-22 LAB — GLUCOSE, CAPILLARY
Glucose-Capillary: 147 mg/dL — ABNORMAL HIGH (ref 70–99)
Glucose-Capillary: 167 mg/dL — ABNORMAL HIGH (ref 70–99)
Glucose-Capillary: 158 mg/dL — ABNORMAL HIGH (ref 70–99)
Glucose-Capillary: 168 mg/dL — ABNORMAL HIGH (ref 70–99)
Glucose-Capillary: 189 mg/dL — ABNORMAL HIGH (ref 70–99)

## 2022-02-22 LAB — MAGNESIUM: Magnesium: 2.8 mg/dL — ABNORMAL HIGH (ref 1.7–2.4)

## 2022-02-22 MED ORDER — PRISMASOL BGK 4/2.5 32-4-2.5 MEQ/L REPLACEMENT SOLN
Status: DC
  Filled 2022-02-22 (×5): qty 5000

## 2022-02-22 MED ORDER — DOXAZOSIN MESYLATE 2 MG PO TABS
2.0000 mg | ORAL_TABLET | Freq: Every day | ORAL | Status: DC
  Administered 2022-02-23: 2 mg
  Filled 2022-02-22 (×2): qty 1

## 2022-02-22 MED ORDER — CLONIDINE HCL 0.2 MG PO TABS
0.3000 mg | ORAL_TABLET | Freq: Three times a day (TID) | ORAL | Status: DC
  Administered 2022-02-22 – 2022-02-23 (×6): 0.3 mg
  Filled 2022-02-22 (×6): qty 1

## 2022-02-22 MED ORDER — CALCIUM GLUCONATE-NACL 1-0.675 GM/50ML-% IV SOLN
1.0000 g | Freq: Once | INTRAVENOUS | Status: AC
  Administered 2022-02-22: 1000 mg via INTRAVENOUS
  Filled 2022-02-22: qty 50

## 2022-02-22 MED ORDER — SENNOSIDES-DOCUSATE SODIUM 8.6-50 MG PO TABS
1.0000 | ORAL_TABLET | Freq: Two times a day (BID) | ORAL | Status: DC
  Administered 2022-02-22 – 2022-02-23 (×4): 1
  Filled 2022-02-22 (×5): qty 1

## 2022-02-22 MED ORDER — POTASSIUM CHLORIDE 20 MEQ PO PACK: 40.0000 meq | PACK | Freq: Once | ORAL | Status: DC

## 2022-02-22 MED ORDER — POTASSIUM CHLORIDE 20 MEQ PO PACK
40.0000 meq | PACK | Freq: Once | ORAL | Status: AC
  Administered 2022-02-22: 40 meq
  Filled 2022-02-22: qty 2

## 2022-02-22 NOTE — Progress Notes (Signed)
Subjective: ?Patient sedated on vent. BPs continue to be intractable.  ? ?Objective: ?Vital signs in last 24 hours: ?Temp:  [97.5 ?F (36.4 ?C)-98.7 ?F (37.1 ?C)] 97.5 ?F (36.4 ?C) (05/08 6834) ?Pulse Rate:  [50-69] 50 (05/08 0800) ?Resp:  [16-35] 35 (05/08 0800) ?BP: (153-185)/(54-68) 170/67 (05/08 0407) ?SpO2:  [91 %-99 %] 93 % (05/08 0826) ?Arterial Line BP: (151-190)/(52-79) 165/63 (05/08 0800) ?FiO2 (%):  [50 %-100 %] 60 % (05/08 0826) ?Weight:  [166.8 kg] 166.8 kg (05/08 0448) ? ?Intake/Output from previous day: ?05/07 0701 - 05/08 0700 ?In: 3287.2 [I.V.:2145.4; NG/GT:949.3; IV Piggyback:192.5] ?Out: 5140 [Urine:195] ?Intake/Output this shift: ?Total I/O ?In: 154.6 [I.V.:89.6; NG/GT:65] ?Out: 371 [Other:371] ? ?Physical Exam: Patient is intubated and sedated. He is unresponsive. Right eye opens to noxious stimuli. PERRL. Gaze fixed. Negative oculocephalic reflex. Corneal reflexes diminished bilaterally. Absent cough and gag. No spontaneous movement. Minimal withdraw in his left foot and left hand with noxious stimuli. No other movement with central or peripheral stimulation. Left craniectomy flap full but soft. Incisions are CDI.  ?  ? ? ?Lab Results: ?Recent Labs  ?  02/21/22 ?0436 02/21/22 ?0839 02/22/22 ?0327 02/22/22 ?0420  ?WBC 12.1*  --  13.9*  --   ?HGB 9.8*   < > 9.3* 10.2*  ?HCT 31.0*   < > 29.5* 30.0*  ?PLT 121*  --  142*  --   ? < > = values in this interval not displayed.  ? ?BMET ?Recent Labs  ?  02/21/22 ?1642 02/21/22 ?2249 02/22/22 ?0327 02/22/22 ?0420  ?NA 147*   < > 143 141  ?K 4.3  --  3.7 3.5  ?CL 101  --  90*  --   ?CO2 32  --  38*  --   ?GLUCOSE 162*  --  150*  --   ?BUN 103*  --  86*  --   ?CREATININE 7.08*  --  5.92*  --   ?CALCIUM 8.0*  --  7.6*  --   ? < > = values in this interval not displayed.  ? ? ?Studies/Results: ?CT HEAD WO CONTRAST ( ) ? ?Result Date: 02/21/2022 ?CLINICAL DATA:  36 year old male code stroke presentation with left deep gray matter hemorrhage. Postoperative day 1  status post left hemi craniectomy for malignant cerebral edema. EXAM: CT HEAD WITHOUT CONTRAST TECHNIQUE: Contiguous axial images were obtained from the base of the skull through the vertex without intravenous contrast. RADIATION DOSE REDUCTION: This exam was performed according to the departmental dose-optimization program which includes automated exposure control, adjustment of the mA and/or kV according to patient size and/or use of iterative reconstruction technique. COMPARISON:  Head CT 03/01/2022 and earlier. FINDINGS: Brain: Oval and lobulated intra-axial hemorrhage in the left hemisphere centered at the lentiform encompasses 58 by 39 x 51 mm (AP by transverse by CC), estimated volume 58 mL not significantly changed from yesterday. Surrounding edema. Ongoing mass effect on the left lateral ventricle. But following craniectomy right side midline shift has decreased to 8-9 mm (previously 11 mm). Basilar cisterns are mildly improved as seen on sagittal image 34. No ventriculomegaly. Overall stable gray-white matter differentiation. Vascular: Mild Calcified atherosclerosis at the skull base. Skull: New left frontotemporal craniectomy. No other acute osseous abnormality. Sinuses/Orbits: Bilateral paranasal sinus fluid levels and mucosal thickening not significantly changed. Left nasoenteric tube remains in place. Right greater than left middle ear and mastoid opacification unchanged. Other: Intubated on the scout view. Orbits soft tissues remain within normal limits. New postoperative changes to the  left scalp with overlying skin staples. Scalp hematoma and small volume of postoperative gas. IMPRESSION: 1. New left side craniectomy with midline shift decreased to 8-9 mL, and improved basilar cisterns. 2. Stable left hemisphere intra-axial hemorrhage (estimated at 58 mL) with regional edema and mass effect on the left lateral ventricle. 3. No new intracranial abnormality. Electronically Signed   By: Odessa Fleming M.D.    On: 02/21/2022 06:49  ? ?CT HEAD WO CONTRAST ( ) ? ?Result Date: 02/16/2022 ?CLINICAL DATA:  Follow-up hemorrhagic stroke EXAM: CT HEAD WITHOUT CONTRAST TECHNIQUE: Contiguous axial images were obtained from the base of the skull through the vertex without intravenous contrast. RADIATION DOSE REDUCTION: This exam was performed according to the departmental dose-optimization program which includes automated exposure control, adjustment of the mA and/or kV according to patient size and/or use of iterative reconstruction technique. COMPARISON:  02/18/2022 FINDINGS: Brain: No abnormality is seen affecting the brainstem or cerebellum. Right cerebral hemisphere remains normal. No change in size of an intraparenchymal hemorrhage in the left basal ganglia and radiating white matter tracts measuring 3.8 x 6.1 by 4.4 cm. Surrounding edema may have increased minimally. Mass effect with left-to-right shift of 11 mm. This is increased 2-3 mm since the previous study. No evidence of ventricular trapping. Vascular: No abnormal vascular finding. Skull: Negative Sinuses/Orbits: Mucosal thickening with layering fluid in the sphenoid sinus. Orbits negative. Other: None IMPRESSION: No change in size of the left basal ganglia/radiating white matter tract intraparenchymal hemorrhage. Slight increase in the amount of surrounding edema. Slight increase in mass effect with left-to-right shift of 11 mm. No evidence of ventricular trapping no new or additional bleeding. Electronically Signed   By: Paulina Fusi M.D.   On: 03/01/2022 14:51  ? ?MR BRAIN WO CONTRAST ? ?Result Date: 02/21/2022 ?CLINICAL DATA:  Stroke, hemorrhagic. EXAM: MRI HEAD WITHOUT CONTRAST TECHNIQUE: Multiplanar, multiecho pulse sequences of the brain and surrounding structures were obtained without intravenous contrast. COMPARISON:  Head CT earlier same day FINDINGS: Brain: The brainstem and cerebellum are unremarkable. Right cerebral hemisphere appears intrinsically normal.  On the left, the patient has had an extensive craniectomy for decompression. Some soft tissue hemorrhage along the craniectomy site is noted. Intraparenchymal hematoma measuring approximally 6 x 4 x 5 cm does not appear increased. Surrounding vasogenic edema appears similar. Mass effect with left-to-right shift of 8 mm is similar. No ventricular trapping. Small amount of blood is present within the dependent occipital horns of the lateral ventricles. Vascular: Major vessels at the base of the brain show flow. Skull and upper cervical spine: Otherwise negative Sinuses/Orbits: Mucosal inflammatory changes of the paranasal sinuses. Small amount of layering fluid. Other: None IMPRESSION: No change appreciated since the CT of earlier same day. Extensive left craniectomy for decompression. Hemorrhage along the soft tissues of the craniectomy site as seen by CT. No increase in size of intraparenchymal hematoma on the left measuring approximately 6 x 4 x 5 cm. Surrounding edema is similar. Mass-effect is similar with left-to-right shift of 8 mm. No ventricular trapping. Tiny amount of blood in the dependent occipital horns. Electronically Signed   By: Paulina Fusi M.D.   On: 02/21/2022 14:21  ? ?DG CHEST PORT 1 VIEW ? ?Result Date: 02/21/2022 ?CLINICAL DATA:  Multifocal pneumonia EXAM: PORTABLE CHEST 1 VIEW COMPARISON:  Chest x-ray dated Feb 20, 2022 FINDINGS: Cardiac and mediastinal contours are unchanged. Stable position of ET tube, feeding tube, and bilateral central venous lines. Improved aeration of the left lung base. No large  pleural effusion or pneumothorax. IMPRESSION: 1. Improved aeration of the left lung base, likely due to decreased atelectasis. 2. Stable support devices. Electronically Signed   By: Allegra LaiLeah  Strickland M.D.   On: 02/21/2022 09:04   ? ?Assessment/Plan: ?36 y.o. male with large left hypertensive basal ganglia hemorrhage with malignant cerebral edema and midline shift that continued to progressively  worsen. He is postop day # 2 s/p left hemicraniectomy on 03/05/2022. CRRT was restarted yesterday without heparin. Neuro examination mostly unchanged. MRI brain was completed yesterday. IPH and surrounding

## 2022-02-22 NOTE — Progress Notes (Signed)
STROKE TEAM PROGRESS NOTE  ? ?INTERVAL HISTORY ?Patient remains intubated on sedation with fentanyl. and Versed.   ?Emergency craniotomy 02/26/2022 after [facility] revealed worsening shift. Difficulty with controlling his BP this am.  Remains and is on Cleviprex drip and multiple blood pressure medicines and added minoxidil and doxazosin yesterday ?CCM managing BP, patient is on  CRRT for dialysis ? Marland KitchenUnable to wean off cleviprex. Not responding to labetalol pushes. CCM planning to hold steroids.  Nephrology plans to remove more fluids.  Respiratory acidosis has improved after bicarb drip.  Neurological exam continues to be limited by sedation ?MRI scan of the brain done yesterday shows stable appearance compared to the CT scan from the day before s/p left hemicraniectomy for decompression.  No increase in size of intraparenchymal hematoma measuring 6 x 4 x 5 cm with surrounding edema which is similar in mass effect with 8 mm left-to-right shift.  No ventricular tracking.  And intraventricular blood ?Vitals:  ? 02/22/22 1200 02/22/22 1203 02/22/22 1300 02/22/22 1400  ?BP:      ?Pulse: (!) 53 (!) 52 (!) 53 (!) 52  ?Resp: (!) 35 (!) 35 (!) 35 (!) 35  ?Temp:      ?TempSrc:      ?SpO2: 93% 93% 93% 91%  ?Weight:      ?Height:      ? ?CBC:  ?Recent Labs  ?Lab 02/19/22 ?0230 02/19/22 ?[id number] 02/19/22 ?1152 02/21/22 ?[id number] 02/21/22 ?[id number] 02/22/22 ?0327 02/22/22 ?[id number] 02/22/22 ?[id number]  ?WBC 11.0* 11.6*  --  12.1*  --  13.9*  --   --   ?NEUTROABS 9.4* 9.5*  --   --   --   --   --   --   ?HGB 11.3* 11.2*   < > 9.8*   < > 9.3* 10.2* 10.2*  ?HCT 39.8 40.7   < > 31.0*   < > 29.5* 30.0* 30.0*  ?MCV 88.6 89.6  --  81.8  --  79.9*  --   --   ?PLT 221 208  --  121*  --  142*  --   --   ? < > = values in this interval not displayed.  ? ?Basic Metabolic Panel:  ?Recent Labs  ?Lab 02/21/22 ?1642 02/21/22 ?2249 02/22/22 ?0327 02/22/22 ?[id number] 02/22/22 ?[id number]  ?NA 147*   < > 143 141 139  ?K 4.3  --  3.7 3.5 3.4*  ?CL 101  --  90*  --   --   ?CO2 32  --   38*  --   --   ?GLUCOSE 162*  --  150*  --   --   ?BUN 103*  --  86*  --   --   ?CREATININE 7.08*  --  5.92*  --   --   ?CALCIUM 8.0*  --  7.6*  --   --   ?MG 2.9*  --  2.8*  --   --   ?PHOS 8.9*  9.0*  --  6.6*  --   --   ? < > = values in this interval not displayed.  ? ?Lipid Panel:  ?Recent Labs  ?Lab 02/21/22 ?0436  ?TRIG 335*  ? ?HgbA1c:  ?No results for input(s): HGBA1C in the last 168 hours. ? ?Urine Drug Screen:  ?No results for input(s): LABOPIA, COCAINSCRNUR, LABBENZ, AMPHETMU, THCU, LABBARB in the last 168 hours. ?  ?Alcohol Level  ?No results for input(s): ETH in the last 168 hours. ? ? ?IMAGING past 24 hours ?No  results found. ? ?PHYSICAL EXAM ? ?Temp:  [97.5 ?F (36.4 ?C)-98.7 ?F (37.1 ?C)] 97.5 ?F (36.4 ?C) (05/08 [id number]) ?Pulse Rate:  [50-69] 52 (05/08 1400) ?Resp:  [16-35] 35 (05/08 1400) ?BP: (163-185)/(54-68) 170/63 (05/08 1146) ?SpO2:  [91 %-97 %] 91 % (05/08 1400) ?Arterial Line BP: (153-190)/(52-77) 158/55 (05/08 1400) ?FiO2 (%):  [50 %-100 %] 60 % (05/08 1203) ?Weight:  [166.8 kg] 166.8 kg (05/08 0448) ? ?General - overweight, intubated on sedation. ? ?Cardiovascular - Regular rate and rhythm. ? ?Neuro - intubated on sedation.  ?Exam consistent with sedation. Eye closed.  Unresponsive.  Does not follow commands.  pupil equal size, sluggish to light. Corneal reflex weakly present, gag and cough present. Breathing over the vent.  On pain stimulation, no  movement in all extremities. Toes mute. ? ? ?ASSESSMENT/PLAN ?[name] is a 36 y.o. male with history of HTN, HLD, obesity, smoking, OSA on CPAP and mood disorder presenting with acute right hemiplegia with right sided numbness.  He was taken to [facility], was found to have an [place] and was transferred here for further management.  ICH score is 1.  Head CT last night reveals increasing left to right midline shift to 34mm without hydrocephalus.  3% HTS is off now due to Na 160.  Patient has confirmed covid-19 infection.  His wife reports  that he smokes cigarettes and is not always compliant with his medical therapy at home. ?Hemicrani after Repeat HCT 5/6 showed 78mm shift. Did not evacuate hematoma due to clinical deficits. BP difficult to control. ? 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 Neurosurg consulted and take for emergent hemicrani. ?MRI 02/21/2022 : Extensive left craniectomy for decompression. Hemorrhage along the soft tissues of the craniectomy site as seen by CT. No increase in size of intraparenchymal hematoma on the left measuring approximately 6 x 4 x 5 cm. ?Surrounding edema is similar. Mass-effect is similar with left-to-right shift of 8 mm. ?No ventricular trapping. Tiny amount ofblood in the dependent occipital horns. ?2D Echo EF 60-65%, interatrial septum not well visualized ?LDL 110 ?HgbA1c 5.5 ?VTE prophylaxis - SCDs ?No antithrombotic prior to admission, now on No antithrombotic secondary to [place] ?Therapy recommendations:  CIR ?Disposition:  pending ? ?Cerebral Edema (brain compression) ?Cerebral edema present from [place] with 41mm midline shift ?3% HTS at 75 cc/hr -> NS @ 50-> off> ?CT Head 4/30 unchanged hematoma, midline shift 7 mm ?CT head 5/1 midline shift 52mm ?CT head 5/4 midline shift 4mm ?Na goal 150-155 ?Na 148->151->152->159-> 162->157->154>150>148>148 ?S/p hemi crani 5/6. Hypertonic stopped.  ? ?Hypertensive  emergency ?Home meds:  losartan-HCTZ 100-12.5 daily, amlodipine 10 mg daily ?Off cleviprex drip. ?On max cardene ggt. Coreg held due to bradycardia in 50's.CCM managing BP difficult to control after hemicrani. Added minixodil, doxazosin today. Unable to do cleviprex. Not responding to labetalol pushes. CCM planning to hold steroids.  ?Keep SBP < 160 ?On amlodipine  10 and coreg, hydralazine, cardura, minixidil ?Long-term BP goal normotensive ? ?Worsening AKI with ATN ?Cre 2.59->2.72->2.90->3.37->3.56-> 3.72-> 3.96->6.28->7.10 ?Nephro on board, appreciate recommendations ?Considering ATN ?CRRT on hold for 24 hrs after hemicrani to avoid heparin. ? ?Covid-19 infection with respiratory failure ?Patient is positive for covid-19 infection ?Was on BiPAP -> intubated ?CCM on board ?On decadron IV->solumedrol ?Per report, pt refused remedesivir and paxlovid  ?Ventilator management by CCM ?On rocephine ?Tmax 101.4->afebrile ?Leukocytosis WBC 15.2-12.4->12.9->11.5-> 11.3-> 8.4->11.6 ? ?Hyperlipidemia ?Home meds:  none ?LDL 110, goal < 70 ?Hold off statin now given ICH ?Add statin at discharge ? ?Tobacco abuse ?Current smoker ?Smoking cessation counseling provided ?Nicotine patch provided ?Pt is willing to quit ? ?Other Stroke Risk Factors ?Obesity, Body mass index is 54.3 kg/m?., BMI >/= 30 associated with increased stroke risk, recommend weight loss, diet and exercise as appropriate  ?Obstructive sleep apnea, on CPAP at home ? ?Other Active Problems ?Mood disorder ?Pneumonia: zithromax stopped yesterday, on rocephin. ? ?Hospital day # 9 ? ?Neurological exam continues to be limited due to heavy sedation.  S/p hemicrani for malignant cerebral edema on 03/17/2022. BP remains quite difficult to control. Minixodil /doxazosin added today. Unable to wean  cleviprex. Not responding to labetalol pushes.    ?Na trending down. Exam consistent with heavy sedation. CCM now primary.  D/w pharmacist about BP med changes. D/w Dr Tacy Learn and Dr  Royce Macadamia ?Long discussion with patient's wife at the bedside about his prognosis and plan of care and answered questions. ?This patient is critically ill due to left BG large ICH, cerebral edema, COVID infection, r

## 2022-02-22 NOTE — Progress Notes (Addendum)
Patient ID: Clinton Taylor, male   DOB: 1986/08/26, 36 y.o.   MRN: [id number] ?[facility] ?Progress Note  ? ?Assessment/ Plan:   ?1.  Acute kidney injury on chronic kidney disease stage IIIb (labs from last month showed creatinine 2.39): Acute kidney injury likely from ischemic ATN (hemodynamic fluctuations) +/- contrast nephropathy.  Started on CRRT after he developed worsening renal function and multiple electrolyte abnormalities in the setting of worsening respiratory failure.  ?- back on CRRT after hemicraniectomy  ?- UF goal net neg 200 ml/hr as tolerated  ?- note on bicarb pre and post CRRT fluids - assess for need to transition to standard fluids ? ?2.  Hypernatremia: was Medically induced with hypertonic saline to mitigate risk from cerebral edema.  Status post hemicraniectomy and note transient hypertonic saline administered preoperatively. ? ?3.  Hypertensive emergency with acute left-sided intracerebral hemorrhage: With acute decompensation due to worsening mass effect with left-to-right shift and underwent emergent left hemicraniectomy to relieve intracranial pressure.  Did not undergo evacuation of hematoma based on physical exam intraoperatively.  ?- note titration of clonidine and recent medication adjustments ?- optimize volume with CRRT ? ?4.  Acute hypoxic respiratory failure: Decadron treatment for COVID-19 infection and community-acquired pneumonia on ceftriaxone and azithromycin. ?- on vent per primary team   ? ?5. Covid PNA ?- therapies per primary team ? ?6. CKD stage 3 ?- last last month with Cr 2.39 per charting ? ?7. Hyperphosphatemia  ?- setting of AKI  ?- manage with CRRT for now  ? ?8. Microcytic anemia  ?- no acute indication for PRBC's ? ?Disposition - in the ICU on CRRT  ? ? ? ?Subjective:   ? ?Seen and examined on CRRT.  Procedure supervised.  Patient with 4.9 kg UF over 5/7 charted with CRRT.  He had 195 ml uop charted over 5/7 as well.  Spoke with his RN as well as  his wife.   He is on cardene gtt.  ? ?Review of systems:  ?Unable to obtain given intubated   ? ?Objective:   ?BP (!) 170/67   Pulse (!) 50   Temp (!) 97.5 ?F (36.4 ?C) (Oral)   Resp (!) 35   Ht 5\' 9"  (1.753 m)   Wt (!) 166.8 kg   SpO2 93%   BMI 54.30 kg/m?  ? ?Intake/Output Summary (Last 24 hours) at 02/22/2022 0937 ?Last data filed at 02/22/2022 0900 ?Gross per 24 hour  ?Intake 3444.8 ml  ?Output 5796 ml  ?Net -2351.2 ml  ? ?Weight change: 1.5 kg ? ?Physical Exam:    ?Gen: Intubated and critically ill  ?HEENT status post left hemicraniectomy ?CVS: Pulse regular bradycardia, S1 and S2. No rub ?Resp: Clear to auscultation bilaterally. On ventilator ?Abd: Soft, obese, nontender ?Ext: 1-2+ bilateral lower extremity edema, right-sided hemiparesis ?Access: RIJ nontunn HD catheter  ? ?Imaging: ?CT HEAD WO CONTRAST (5MM) ? ?Result Date: 02/21/2022 ?CLINICAL DATA:  36 year old male code stroke presentation with left deep gray matter hemorrhage. 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 EXAM: CT HEAD WITHOUT CONTRAST TECHNIQUE: Contiguous axial images were obtained from the base of the skull through the vertex without intravenous contrast. RADIATION DOSE REDUCTION: This exam was performed according to the departmental dose-optimization program which includes automated exposure control, adjustment of the mA and/or kV according to patient size and/or use of iterative reconstruction technique. COMPARISON:  Head CT 02/26/2022 and earlier. FINDINGS: Brain: Oval and lobulated intra-axial hemorrhage in the left hemisphere  centered at the lentiform encompasses 58 by 39 x 51 mm (AP by transverse by CC), estimated volume 58 mL not significantly changed from yesterday. Surrounding edema. Ongoing mass effect on the left lateral ventricle. But following craniectomy right side midline shift has decreased to 8-9 mm (previously 11 mm). Basilar cisterns are mildly improved as seen on sagittal image  34. No ventriculomegaly. Overall stable gray-white matter differentiation. Vascular: Mild Calcified atherosclerosis at the skull base. Skull: New left frontotemporal craniectomy. No other acute osseous abnormality. Sinuses/Orbits: Bilateral paranasal sinus fluid levels and mucosal thickening not significantly changed. Left nasoenteric tube remains in place. Right greater than left middle ear and mastoid opacification unchanged. Other: Intubated on the scout view. Orbits soft tissues remain within normal limits. New postoperative changes to the left scalp with overlying skin staples. Scalp hematoma and small volume of postoperative gas. IMPRESSION: 1. New left side craniectomy with midline shift decreased to 8-9 mL, and improved basilar cisterns. 2. Stable left hemisphere intra-axial hemorrhage (estimated at 58 mL) with regional edema and mass effect on the left lateral ventricle. 3. No new intracranial abnormality. Electronically Signed   By: Genevie Ann M.D.   On: 02/21/2022 06:49  ? ?CT HEAD WO CONTRAST (5MM) ? ?Result Date: 03/12/2022 ?CLINICAL DATA:  Follow-up hemorrhagic stroke EXAM: CT HEAD WITHOUT CONTRAST TECHNIQUE: Contiguous axial images were obtained from the base of the skull through the vertex without intravenous contrast. RADIATION DOSE REDUCTION: This exam was performed according to the departmental dose-optimization program which includes automated exposure control, adjustment of the mA and/or kV according to patient size and/or use of iterative reconstruction technique. COMPARISON:  02/18/2022 FINDINGS: Brain: No abnormality is seen affecting the brainstem or cerebellum. Right cerebral hemisphere remains normal. No change in size of an intraparenchymal hemorrhage in the left basal ganglia and radiating white matter tracts measuring 3.8 x 6.1 by 4.4 cm. Surrounding edema may have increased minimally. Mass effect with left-to-right shift of 11 mm. This is increased 2-3 mm since the previous study. No  evidence of ventricular trapping. Vascular: No abnormal vascular finding. Skull: Negative Sinuses/Orbits: Mucosal thickening with layering fluid in the sphenoid sinus. Orbits negative. Other: None IMPRESSION: No change in size of the left basal ganglia/radiating white matter tract intraparenchymal hemorrhage. Slight increase in the amount of surrounding edema. Slight increase in mass effect with left-to-right shift of 11 mm. No evidence of ventricular trapping no new or additional bleeding. Electronically Signed   By: Nelson Chimes M.D.   On: 02/17/2022 14:51  ? ?MR BRAIN WO CONTRAST ? ?Result Date: 02/21/2022 ?CLINICAL DATA:  Stroke, hemorrhagic. EXAM: MRI HEAD WITHOUT CONTRAST TECHNIQUE: Multiplanar, multiecho pulse sequences of the brain and surrounding structures were obtained without intravenous contrast. COMPARISON:  Head CT earlier same day FINDINGS: Brain: The brainstem and cerebellum are unremarkable. Right cerebral hemisphere appears intrinsically normal. On the left, the patient has had an extensive craniectomy for decompression. Some soft tissue hemorrhage along the craniectomy site is noted. Intraparenchymal hematoma measuring approximally 6 x 4 x 5 cm does not appear increased. Surrounding vasogenic edema appears similar. Mass effect with left-to-right shift of 8 mm is similar. No ventricular trapping. Small amount of blood is present within the dependent occipital horns of the lateral ventricles. Vascular: Major vessels at the base of the brain show flow. Skull and upper cervical spine: Otherwise negative Sinuses/Orbits: Mucosal inflammatory changes of the paranasal sinuses. Small amount of layering fluid. Other: None IMPRESSION: No change appreciated since the CT of earlier same day.  Extensive left craniectomy for decompression. Hemorrhage along the soft tissues of the craniectomy site as seen by CT. No increase in size of intraparenchymal hematoma on the left measuring approximately 6 x 4 x 5 cm.  Surrounding edema is similar. Mass-effect is similar with left-to-right shift of 8 mm. No ventricular trapping. Tiny amount of blood in the dependent occipital horns. Electronically Signed   By: Nelson Chimes M.D.   O

## 2022-02-22 NOTE — Progress Notes (Signed)
Changes made based on ABG reslt.  FiO2 increased over night d/t desat.  PAo2 255 at this time.  Decreased I-time to .75 as well. ?
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
## 2022-02-22 NOTE — Plan of Care (Addendum)
Patient sedated and intubated unable to verbalize understanding. Plan of care reviewed with wife Herbert Seta she verbalizes understanding. Education on COVID isolation with wife and father re mask and isolation for covid per Dr. Merrily Pew and ID patient will stay on isolation for 21 days. Family verbalized understanding.  ?

## 2022-02-22 NOTE — Progress Notes (Signed)
CSW received request from RN to speak with patient's spouse regarding Medicaid application. CSW spoke with spouse outside of patient's room and she stated she will be contacting [employer] to complete an application. She also inquired about Disability and CSW directed her to the Social Security office. No other questions at this time and she is waiting to hear back from patient's employer regarding continuing his insurance.  ? ?Cristobal Goldmann ?LCSW, MSW, MHA ? ?

## 2022-02-23 ENCOUNTER — Inpatient Hospital Stay (HOSPITAL_COMMUNITY): Payer: BC Managed Care – PPO

## 2022-02-23 DIAGNOSIS — I615 Nontraumatic intracerebral hemorrhage, intraventricular: Secondary | ICD-10-CM

## 2022-02-23 DIAGNOSIS — N170 Acute kidney failure with tubular necrosis: Secondary | ICD-10-CM | POA: Diagnosis not present

## 2022-02-23 DIAGNOSIS — I610 Nontraumatic intracerebral hemorrhage in hemisphere, subcortical: Secondary | ICD-10-CM | POA: Diagnosis not present

## 2022-02-23 DIAGNOSIS — U071 COVID-19: Secondary | ICD-10-CM | POA: Diagnosis not present

## 2022-02-23 LAB — MAGNESIUM: Magnesium: 3.4 mg/dL — ABNORMAL HIGH (ref 1.7–2.4)

## 2022-02-23 LAB — POCT I-STAT 7, (LYTES, BLD GAS, ICA,H+H)
Acid-Base Excess: 13 mmol/L — ABNORMAL HIGH (ref 0.0–2.0)
Acid-Base Excess: 4 mmol/L — ABNORMAL HIGH (ref 0.0–2.0)
Acid-Base Excess: 9 mmol/L — ABNORMAL HIGH (ref 0.0–2.0)
Bicarbonate: 30.4 mmol/L — ABNORMAL HIGH (ref 20.0–28.0)
Bicarbonate: 37.6 mmol/L — ABNORMAL HIGH (ref 20.0–28.0)
Bicarbonate: 41.6 mmol/L — ABNORMAL HIGH (ref 20.0–28.0)
Calcium, Ion: 1.1 mmol/L — ABNORMAL LOW (ref 1.15–1.40)
Calcium, Ion: 1.15 mmol/L (ref 1.15–1.40)
Calcium, Ion: 1.16 mmol/L (ref 1.15–1.40)
HCT: 31 % — ABNORMAL LOW (ref 39.0–52.0)
HCT: 33 % — ABNORMAL LOW (ref 39.0–52.0)
HCT: 33 % — ABNORMAL LOW (ref 39.0–52.0)
Hemoglobin: 10.5 g/dL — ABNORMAL LOW (ref 13.0–17.0)
Hemoglobin: 11.2 g/dL — ABNORMAL LOW (ref 13.0–17.0)
Hemoglobin: 11.2 g/dL — ABNORMAL LOW (ref 13.0–17.0)
O2 Saturation: 94 %
O2 Saturation: 97 %
O2 Saturation: 97 %
Patient temperature: 97.1
Patient temperature: 97.6
Patient temperature: 98.7
Potassium: 3.6 mmol/L (ref 3.5–5.1)
Potassium: 4.2 mmol/L (ref 3.5–5.1)
Potassium: 4.5 mmol/L (ref 3.5–5.1)
Sodium: 138 mmol/L (ref 135–145)
Sodium: 138 mmol/L (ref 135–145)
Sodium: 138 mmol/L (ref 135–145)
TCO2: 32 mmol/L (ref 22–32)
TCO2: 40 mmol/L — ABNORMAL HIGH (ref 22–32)
TCO2: 44 mmol/L — ABNORMAL HIGH (ref 22–32)
pCO2 arterial: 56.5 mmHg — ABNORMAL HIGH (ref 32–48)
pCO2 arterial: 73.5 mmHg (ref 32–48)
pCO2 arterial: 74.3 mmHg (ref 32–48)
pH, Arterial: 7.308 — ABNORMAL LOW (ref 7.35–7.45)
pH, Arterial: 7.339 — ABNORMAL LOW (ref 7.35–7.45)
pH, Arterial: 7.358 (ref 7.35–7.45)
pO2, Arterial: 100 mmHg (ref 83–108)
pO2, Arterial: 102 mmHg (ref 83–108)
pO2, Arterial: 78 mmHg — ABNORMAL LOW (ref 83–108)

## 2022-02-23 LAB — RENAL FUNCTION PANEL
Albumin: 3 g/dL — ABNORMAL LOW (ref 3.5–5.0)
Albumin: 2.8 g/dL — ABNORMAL LOW (ref 3.5–5.0)
Anion gap: 11 (ref 5–15)
Anion gap: 12 (ref 5–15)
BUN: 68 mg/dL — ABNORMAL HIGH (ref 6–20)
BUN: 63 mg/dL — ABNORMAL HIGH (ref 6–20)
CO2: 34 mmol/L — ABNORMAL HIGH (ref 22–32)
CO2: 27 mmol/L (ref 22–32)
Calcium: 8.6 mg/dL — ABNORMAL LOW (ref 8.9–10.3)
Calcium: 8.3 mg/dL — ABNORMAL LOW (ref 8.9–10.3)
Chloride: 95 mmol/L — ABNORMAL LOW (ref 98–111)
Chloride: 101 mmol/L (ref 98–111)
Creatinine, Ser: 4.42 mg/dL — ABNORMAL HIGH (ref 0.61–1.24)
Creatinine, Ser: 3.71 mg/dL — ABNORMAL HIGH (ref 0.61–1.24)
GFR, Estimated: 17 mL/min — ABNORMAL LOW (ref >60)
GFR, Estimated: 21 mL/min — ABNORMAL LOW (ref >60)
Glucose, Bld: 166 mg/dL — ABNORMAL HIGH (ref 70–99)
Glucose, Bld: 137 mg/dL — ABNORMAL HIGH (ref 70–99)
Phosphorus: 6.7 mg/dL — ABNORMAL HIGH (ref 2.5–4.6)
Phosphorus: 4.6 mg/dL (ref 2.5–4.6)
Potassium: 4.1 mmol/L (ref 3.5–5.1)
Potassium: 4.6 mmol/L (ref 3.5–5.1)
Sodium: 140 mmol/L (ref 135–145)
Sodium: 140 mmol/L (ref 135–145)

## 2022-02-23 LAB — GLUCOSE, CAPILLARY
Glucose-Capillary: 126 mg/dL — ABNORMAL HIGH (ref 70–99)
Glucose-Capillary: 167 mg/dL — ABNORMAL HIGH (ref 70–99)
Glucose-Capillary: 174 mg/dL — ABNORMAL HIGH (ref 70–99)
Glucose-Capillary: 175 mg/dL — ABNORMAL HIGH (ref 70–99)
Glucose-Capillary: 179 mg/dL — ABNORMAL HIGH (ref 70–99)
Glucose-Capillary: 197 mg/dL — ABNORMAL HIGH (ref 70–99)

## 2022-02-23 MED ORDER — LISINOPRIL 20 MG PO TABS
40.0000 mg | ORAL_TABLET | Freq: Every day | ORAL | Status: DC
  Administered 2022-02-23: 40 mg
  Filled 2022-02-23: qty 2

## 2022-02-23 MED ORDER — PIVOT 1.5 CAL PO LIQD
1000.0000 mL | ORAL | Status: DC
  Administered 2022-02-23 – 2022-02-26 (×2): 1000 mL

## 2022-02-23 MED ORDER — SPIRONOLACTONE 25 MG PO TABS
50.0000 mg | ORAL_TABLET | Freq: Every day | ORAL | Status: DC
  Administered 2022-02-23: 50 mg
  Filled 2022-02-23: qty 2

## 2022-02-23 MED ORDER — HYDRALAZINE HCL 50 MG PO TABS
50.0000 mg | ORAL_TABLET | Freq: Four times a day (QID) | ORAL | Status: DC
  Administered 2022-02-24: 50 mg
  Filled 2022-02-23: qty 1
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
## 2022-02-23 NOTE — Progress Notes (Signed)
RT obtained ABG and reported critical results to Dr.Chand @ 0804.   ?ABG as follows: 7.30/74.3/100/37.6 ?MD increased tidal volume to 490, decreased RR to 32, decreased peep 10 and decreased FiO2 to 50%. RT will obtain another ABG this afternoon and continue to monitor.  ?

## 2022-02-23 NOTE — Progress Notes (Signed)
Subjective: ?Patient reports sedated on vent. BPs have been more manageable. No acute events overnight.   ? ?Objective: ?Vital signs in last 24 hours: ?Temp:  [97.1 ?F (36.2 ?C)-97.7 ?F (36.5 ?C)] 97.1 ?F (36.2 ?C) (05/09 0400) ?Pulse Rate:  [45-55] 50 (05/09 0700) ?Resp:  [23-35] 23 (05/09 0700) ?BP: (145-170)/(56-67) 157/67 (05/09 0416) ?SpO2:  [90 %-97 %] 94 % (05/09 0751) ?Arterial Line BP: (143-169)/(55-71) 165/59 (05/09 0700) ?FiO2 (%):  [60 %] 60 % (05/09 0751) ?Weight:  [153.3 kg] 153.3 kg (05/09 0500) ? ?Intake/Output from previous day: ?05/08 0701 - 05/09 0700 ?In: 4099.9 [I.V.:2342.1; NG/GT:1710; IV Piggyback:47.8] ?Out: 8652 [Urine:5] ?Intake/Output this shift: ?Total I/O ?In: 165 [I.V.:100; NG/GT:65] ?Out: 327 [Other:327] ? ?Physical Exam: Patient is intubated and sedated on Fentanyl. He is unresponsive. Right with minimal opening to noxious stimuli. PERRL. 2-3 mm and sluggish. Dysconjugate gaze. Corneal reflexes diminished on the right, intact on the left. No spontaneous movement. Minimal withdraw in his left foot with noxious stimuli. No other movement with central or peripheral stimulation. Left craniectomy flap full but soft. Incisions are CDI.  ?  ? ?Lab Results: ?Recent Labs  ?  02/21/22 ?0436 02/21/22 ?0839 02/22/22 ?0327 02/22/22 ?0420 02/23/22 ?0022 02/23/22 ?0804  ?WBC 12.1*  --  13.9*  --   --   --   ?HGB 9.8*   < > 9.3*   < > 11.2* 11.2*  ?HCT 31.0*   < > 29.5*   < > 33.0* 33.0*  ?PLT 121*  --  142*  --   --   --   ? < > = values in this interval not displayed.  ? ?BMET ?Recent Labs  ?  02/22/22 ?1553 02/23/22 ?0022 02/23/22 ?1610 02/23/22 ?0804  ?NA 140   < > 140 138  ?K 3.5   < > 4.1 4.2  ?CL 83*  --  95*  --   ?CO2 42*  --  34*  --   ?GLUCOSE 158*  --  166*  --   ?BUN 71*  --  68*  --   ?CREATININE 4.77*  --  4.42*  --   ?CALCIUM 7.9*  --  8.6*  --   ? < > = values in this interval not displayed.  ? ? ?Studies/Results: ?MR BRAIN WO CONTRAST ? ?Result Date: 02/21/2022 ?CLINICAL DATA:   Stroke, hemorrhagic. EXAM: MRI HEAD WITHOUT CONTRAST TECHNIQUE: Multiplanar, multiecho pulse sequences of the brain and surrounding structures were obtained without intravenous contrast. COMPARISON:  Head CT earlier same day FINDINGS: Brain: The brainstem and cerebellum are unremarkable. Right cerebral hemisphere appears intrinsically normal. On the left, the patient has had an extensive craniectomy for decompression. Some soft tissue hemorrhage along the craniectomy site is noted. Intraparenchymal hematoma measuring approximally 6 x 4 x 5 cm does not appear increased. Surrounding vasogenic edema appears similar. Mass effect with left-to-right shift of 8 mm is similar. No ventricular trapping. Small amount of blood is present within the dependent occipital horns of the lateral ventricles. Vascular: Major vessels at the base of the brain show flow. Skull and upper cervical spine: Otherwise negative Sinuses/Orbits: Mucosal inflammatory changes of the paranasal sinuses. Small amount of layering fluid. Other: None IMPRESSION: No change appreciated since the CT of earlier same day. Extensive left craniectomy for decompression. Hemorrhage along the soft tissues of the craniectomy site as seen by CT. No increase in size of intraparenchymal hematoma on the left measuring approximately 6 x 4 x 5 cm. Surrounding edema is similar.  Mass-effect is similar with left-to-right shift of 8 mm. No ventricular trapping. Tiny amount of blood in the dependent occipital horns. Electronically Signed   By: Paulina Fusi M.D.   On: 02/21/2022 14:21   ? ?Assessment/Plan: ?36 y.o. 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 He is postop day # 3 s/p left hemicraniectomy on 02/18/2022. He continues to be sedated and on the ventilator. CRRT working on pulling of excess fluid. BPs have been more stable. Neuro examination mostly unchanged. No new  neurosurgical recommendations. Maintain SBP < 160. Continue supportive care.  ?  ? LOS: 10 days  ? ? ? ?Council Mechanic, DNP, NP-C ?02/23/2022, 8:33 AM ? ? ? ? ?

## 2022-02-23 NOTE — Progress Notes (Signed)
STROKE TEAM PROGRESS NOTE  ? ?INTERVAL HISTORY ?Patient remains intubated on sedation with fentanyl. and Versed.  Neurological exam remains limited due to sedation.  Patient remains on CRRT for acute renal failure.  Blood pressure is better controlled now and he is off Cleviprex drip. ?  ?Vitals:  ? 02/23/22 1000 02/23/22 1040 02/23/22 1100 02/23/22 1200  ?BP:      ?Pulse: (!) 47 (!) 47 (!) 49   ?Resp: (!) 23 (!) 32 (!) 32   ?Temp:    97.8 ?F (36.6 ?C)  ?TempSrc:    Axillary  ?SpO2: 91% 95% 95%   ?Weight:      ?Height:      ? ?CBC:  ?Recent Labs  ?Lab 02/19/22 ?0230 02/19/22 ?2103 02/19/22 ?1152 02/21/22 ?1281 02/21/22 ?1886 02/22/22 ?0327 02/22/22 ?7737 02/23/22 ?0022 02/23/22 ?3668  ?WBC 11.0* 11.6*  --  12.1*  --  13.9*  --   --   --   ?NEUTROABS 9.4* 9.5*  --   --   --   --   --   --   --   ?HGB 11.3* 11.2*   < > 9.8*   < > 9.3*   < > 11.2* 11.2*  ?HCT 39.8 40.7   < > 31.0*   < > 29.5*   < > 33.0* 33.0*  ?MCV 88.6 89.6  --  81.8  --  79.9*  --   --   --   ?PLT 221 208  --  121*  --  142*  --   --   --   ? < > = values in this interval not displayed.  ? ?Basic Metabolic Panel:  ?Recent Labs  ?Lab 02/22/22 ?0327 02/22/22 ?0420 02/22/22 ?1553 02/23/22 ?0022 02/23/22 ?1594 02/23/22 ?0804  ?NA 143   < > 140   < > 140 138  ?K 3.7   < > 3.5   < > 4.1 4.2  ?CL 90*  --  83*  --  95*  --   ?CO2 38*  --  42*  --  34*  --   ?GLUCOSE 150*  --  158*  --  166*  --   ?BUN 86*  --  71*  --  68*  --   ?CREATININE 5.92*  --  4.77*  --  4.42*  --   ?CALCIUM 7.6*  --  7.9*  --  8.6*  --   ?MG 2.8*  --   --   --  3.4*  --   ?PHOS 6.6*  --  6.6*  --  6.7*  --   ? < > = values in this interval not displayed.  ? ?Lipid Panel:  ?Recent Labs  ?Lab 02/21/22 ?0436  ?TRIG 335*  ? ?HgbA1c:  ?No results for input(s): HGBA1C in the last 168 hours. ? ?Urine Drug Screen:  ?No results for input(s): LABOPIA, COCAINSCRNUR, LABBENZ, AMPHETMU, THCU, LABBARB in the last 168 hours. ?  ?Alcohol Level  ?No results for input(s): ETH in the last 168  hours. ? ? ?IMAGING past 24 hours ?DG CHEST PORT 1 VIEW ? ?Result Date: 02/23/2022 ?CLINICAL DATA:  Respiratory failure. EXAM: PORTABLE CHEST 1 VIEW COMPARISON:  02/21/2022 FINDINGS: 0846 hours. Endotracheal tube tip is 3.6 cm above the base of the carina. A feeding tube passes into the stomach although the distal tip position is not included on the film. Right IJ central line tip overlies the proximal SVC level. Left IJ central line tip is positioned at  the innominate vein confluence. The cardio pericardial silhouette is enlarged. There is pulmonary vascular congestion without overt pulmonary edema. Basilar atelectasis noted without substantial pleural effusion. IMPRESSION: 1. Support apparatus as described. 2. Enlarged cardiopericardial silhouette with pulmonary vascular congestion. 3. Basilar atelectasis. No overt pulmonary edema or substantial pleural effusion. Electronically Signed   By: Kennith CenterEric  Mansell M.D.   On: 02/23/2022 09:01   ? ?PHYSICAL EXAM ? ?Temp:  [97.1 ?F (36.2 ?C)-97.8 ?F (36.6 ?C)] 97.8 ?F (36.6 ?C) (05/09 1200) ?Pulse Rate:  [45-55] 49 (05/09 1100) ?Resp:  [23-35] 32 (05/09 1100) ?BP: (145-164)/(56-67) 157/67 (05/09 0416) ?SpO2:  [90 %-97 %] 95 % (05/09 1100) ?Arterial Line BP: (142-165)/(54-74) 150/57 (05/09 1100) ?FiO2 (%):  [50 %-60 %] 50 % (05/09 1040) ?Weight:  [153.3 kg] 153.3 kg (05/09 0500) ? ?General - overweight, intubated on sedation. ? ?Cardiovascular - Regular rate and rhythm. ? ?Neuro - intubated on sedation.  ?Exam consistent with sedation. Eye closed.  Unresponsive.  Does not follow commands.  pupil equal size, sluggish to light. Corneal reflex weakly present, gag and cough present. Breathing over the vent.  On pain stimulation, no  movement in all extremities. Toes mute. ? ? ?ASSESSMENT/PLAN ?Clinton Taylor is a 36 y.o. male with history of HTN, HLD, obesity, smoking, OSA on CPAP and mood disorder presenting with acute right hemiplegia with right sided numbness.  [facility] was [facility], was found to have an ICH and was transferred here for further management.  ICH score is 1.  Head CT last night reveals increasing left to right midline shift to 9mm without hydrocephalus.  3% HTS is off now due to Na 160.  Patient has confirmed covid-19 infection.  His wife reports that he smokes cigarettes and is not always compliant with his medical therapy at home. ?Hemicrani after Repeat HCT 5/6 showed 11mm shift. Did not evacuate hematoma due to clinical deficits. BP difficult to control. ? 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 Neurosurg consulted and take for emergent hemicrani. ?MRI 02/21/2022 : Extensive left craniectomy for decompression. Hemorrhage along the soft tissues of the craniectomy site as seen by CT. No increase in size of intraparenchymal hematoma on the left measuring approximately 6 x 4 x 5 cm. ?Surrounding edema is similar. Mass-effect is similar with left-to-right shift of 8 mm. ?No ventricular trapping. Tiny amount ofblood in the dependent occipital horns. ?2D Echo EF 60-65%, interatrial septum not well visualized ?LDL 110 ?HgbA1c 5.5 ?VTE prophylaxis - SCDs ?No antithrombotic prior to admission, now on No antithrombotic secondary to ICH ?Therapy recommendations:  CIR ?Disposition:  pending ? ?Cerebral Edema (brain compression) ?Cerebral edema present from ICH with 9mm  midline shift ?3% HTS at 75 cc/hr -> NS @ 50-> off> ?CT Head 4/30 unchanged hematoma, midline shift 7 mm ?CT head 5/1 midline shift 5mm ?CT head 5/4 midline shift 6mm ?Na goal 150-155 ?Na 148->151->152->159-> 162->157->154>150>148>148 ?  S/p hemi crani 5/6. Hypertonic stopped.  ? ?Hypertensive emergency ?Home meds:  losartan-HCTZ 100-12.5 daily, amlodipine 10 mg daily ?Off cleviprex drip. ?On max cardene ggt. Coreg held due to bradycardia in 50's.CCM managing BP difficult to control after hemicrani. Added minixodil, doxazosin today. Unable to do cleviprex. Not responding to labetalol pushes. CCM planning to hold steroids.  ?Keep SBP < 160 ?On amlodipine 10 and coreg, hydralazine, cardura, minixidil ?Long-term BP goal normotensive ? ?Worsening AKI with ATN ?Cre 2.59->2.72->2.90->3.37->3.56-> 3.72-> 3.96->6.28->7.10 ?Nephro on board, appreciate recommendations ?Considering ATN ?CRRT on hold for 24 hrs after hemicrani to avoid heparin. ? ?Covid-19 infection with respiratory failure ?Patient is positive for covid-19 infection ?Was on BiPAP -> intubated ?CCM on board ?On decadron IV->solumedrol ?Per report, pt refused remedesivir and paxlovid  ?Ventilator management by CCM ?On rocephine ?Tmax 101.4->afebrile ?Leukocytosis WBC 15.2-12.4->12.9->11.5-> 11.3-> 8.4->11.6 ? ?Hyperlipidemia ?Home meds:  none ?LDL 110, goal < 70 ?Hold off statin now given ICH ?Add statin at discharge ? ?Tobacco abuse ?Current smoker ?Smoking cessation counseling provided ?Nicotine patch provided ?Pt is willing to quit ? ?Other Stroke Risk Factors ?Obesity, Body mass index is 49.91 kg/m?., BMI >/= 30 associated with increased stroke risk, recommend weight loss, diet and exercise as appropriate  ?Obstructive sleep apnea, on CPAP at home ? ?Other Active Problems ?Mood disorder ?Pneumonia: zithromax stopped yesterday, on rocephin. ? ?Hospital day # 10 ? ?Neurological exam continues to be limited due to heavy sedation.  S/p hemicrani for malignant  cerebral edema on 03/12/2022. BP now better controlled and he is off Cleviprex drip and is on clonidine, Minixodil ,doxazosin aNa trending down. Exam consistent with heavy sedation. CCM now primary.  D/w pharmacis

## 2022-02-23 NOTE — Progress Notes (Signed)
Patient ID: Clinton Taylor, male   DOB: [birth year], 36 y.o.   MRN: [id number] ?[facility] ?Progress Note  ? ?Assessment/ Plan:   ?1.  Acute kidney injury on chronic kidney disease stage IIIb (labs from last month showed creatinine 2.39): Acute kidney injury likely from ischemic ATN (hemodynamic fluctuations) +/- contrast nephropathy.  Started on CRRT after he developed worsening renal function and multiple electrolyte abnormalities in the setting of worsening respiratory failure.  ?- Continue CRRT  ?- Reduce UF goal to net neg 100 ml/hr as tolerated - high pull yesterday and has high hourly inputs which would be a set up for this to happen again ?- now on standard fluids for CRRT  ? ?2.  Hypernatremia: was medically induced with hypertonic saline to mitigate risk from cerebral edema.  Status post hemicraniectomy and note transient hypertonic saline administered preoperatively. ? ?3.  Hypertensive emergency with acute left-sided intracerebral hemorrhage: With acute decompensation due to worsening mass effect with left-to-right shift and underwent emergent left hemicraniectomy to relieve intracranial pressure.  Did not undergo evacuation of hematoma based on physical exam intraoperatively.  ?- note titration of clonidine and recent medication adjustments ?- optimize volume with CRRT ? ?4.  Acute hypoxic respiratory failure: Decadron treatment for COVID-19 infection and community-acquired pneumonia on ceftriaxone and azithromycin. ?- on vent per primary team   ? ?5. Covid PNA ?- therapies per primary team ? ?6. CKD stage 3 ?- last last month with Cr 2.39 per charting ? ?7. Hyperphosphatemia  ?- setting of AKI  ?- manage with CRRT for now  ? ?8. Microcytic anemia  ?- no acute indication for PRBC's ?- improved ? ?Disposition - in the ICU on CRRT  ? ? ? ?Subjective:   ? ?Seen and examined on CRRT.  Procedure supervised.  Patient with 8.3 kg UF over 5/8 charted with CRRT.  Anuric over 5/8.  Still on  cardene gtt. Per RN has been opening his eyes to pain.     ? ?Review of systems:   ?Unable to obtain given intubated   ? ?Objective:   ?BP (!) 157/67   Pulse (!) 48   Temp (!) 97.1 ?F (36.2 ?C) (Axillary)   Resp (!) 35   Ht 5' 9"  (1.753 m)   Wt (!) 153.3 kg   SpO2 94%   BMI 49.91 kg/m?  ? ?Intake/Output Summary (Last 24 hours) at 02/23/2022 0705 ?Last data filed at 02/23/2022 0600 ?Gross per 24 hour  ?Intake 3935.28 ml  ?Output 8301 ml  ?Net -4365.72 ml  ? ?Weight change: -13.5 kg ? ?Physical Exam:   ?Gen: Intubated and critically ill  ?HEENT status post left hemicraniectomy ?CVS: bradycardia, S1 and S2. No rub ?Resp: Clear to auscultation bilaterally. FIO2 60 and PEEP 12 ?Abd: Soft, obese, nontender ?Ext: 1-2+ bilateral lower and upper extremity edema, right-sided hemiparesis ?Neuro - sedation is currently running and does not respond to commands ?Access: RIJ nontunn HD catheter in use ? ? ?Imaging: ?MR BRAIN WO CONTRAST ? ?Result Date: 02/21/2022 ?CLINICAL DATA:  Stroke, hemorrhagic. EXAM: MRI HEAD WITHOUT CONTRAST TECHNIQUE: Multiplanar, multiecho pulse sequences of the brain and surrounding structures were obtained without intravenous contrast. COMPARISON:  Head CT earlier same day FINDINGS: Brain: The brainstem and cerebellum are unremarkable. Right cerebral hemisphere appears intrinsically normal. On the left, the patient has had an extensive craniectomy for decompression. Some soft tissue hemorrhage along the craniectomy site is noted. Intraparenchymal hematoma measuring approximally 6 x 4 x 5 cm does not appear  increased. Surrounding vasogenic edema appears similar. Mass effect with left-to-right shift of 8 mm is similar. No ventricular trapping. Small amount of blood is present within the dependent occipital horns of the lateral ventricles. Vascular: Major vessels at the base of the brain show flow. Skull and upper cervical spine: Otherwise negative Sinuses/Orbits: Mucosal inflammatory changes of the  paranasal sinuses. Small amount of layering fluid. Other: None IMPRESSION: No change appreciated since the CT of earlier same day. Extensive left craniectomy for decompression. Hemorrhage along the soft tissues of the craniectomy site as seen by CT. No increase in size of intraparenchymal hematoma on the left measuring approximately 6 x 4 x 5 cm. Surrounding edema is similar. Mass-effect is similar with left-to-right shift of 8 mm. No ventricular trapping. Tiny amount of blood in the dependent occipital horns. Electronically Signed   By: Nelson Chimes M.D.   On: 02/21/2022 14:21   ? ?Labs: ?BMET ?Recent Labs  ?Lab 02/26/2022 ?9528 03/15/2022 ?0956 03/01/2022 ?1600 03/11/2022 ?2254 02/21/22 ?4132 02/21/22 ?4401 02/21/22 ?1642 02/21/22 ?2249 02/22/22 ?0327 02/22/22 ?0272 02/22/22 ?5366 02/22/22 ?1553 02/23/22 ?0022 02/23/22 ?4403  ?NA 150*   < > 145  145   < > 149*  148*   < > 147* 146* 143 141 139 140 138 140  ?K 4.8   < > 3.8  --  4.7   < > 4.3  --  3.7 3.5 3.4* 3.5 3.6 4.1  ?CL 107  --  97*  --  104  --  101  --  90*  --   --  83*  --  95*  ?CO2 32  --  36*  --  33*  --  32  --  38*  --   --  42*  --  34*  ?GLUCOSE 138*  --  157*  --  181*  --  162*  --  150*  --   --  158*  --  166*  ?BUN 58*  --  60*  --  81*  --  103*  --  86*  --   --  71*  --  68*  ?CREATININE 5.49*  --  5.18*  --  6.07*  --  7.08*  --  5.92*  --   --  4.77*  --  4.42*  ?CALCIUM 7.9*  --  7.8*  --  7.9*  --  8.0*  --  7.6*  --   --  7.9*  --  8.6*  ?PHOS 7.9*  --  8.0*  8.1*  --  8.7*  8.7*  --  8.9*  9.0*  --  6.6*  --   --  6.6*  --  6.7*  ? < > = values in this interval not displayed.  ? ?CBC ?Recent Labs  ?Lab 02/17/22 ?0433 02/18/22 ?0411 02/18/22 ?1246 02/19/22 ?0230 02/19/22 ?4742 02/19/22 ?1152 02/21/22 ?5956 02/21/22 ?3875 02/22/22 ?0327 02/22/22 ?6433 02/22/22 ?2951 02/23/22 ?0022  ?WBC 11.3* 8.4  --  11.0* 11.6*  --  12.1*  --  13.9*  --   --   --   ?NEUTROABS 8.9* 7.2  --  9.4* 9.5*  --   --   --   --   --   --   --   ?HGB 11.5* 11.8*    < > 11.3* 11.2*   < > 9.8*   < > 9.3* 10.2* 10.2* 11.2*  ?HCT 37.0* 38.5*   < > 39.8 40.7   < > 31.0*   < >  29.5* 30.0* 30.0* 33.0*  ?MCV 81.3 81.7  --  88.6 89.6  --  81.8  --  79.9*  --   --   --   ?PLT 256 236  --  221 208  --  121*  --  142*  --   --   --   ? < > = values in this interval not displayed.  ? ? ?Medications:   ? ? amLODipine  10 mg Per Tube Daily  ? ARIPiprazole  10 mg Per Tube Daily  ? chlorhexidine gluconate (MEDLINE KIT)  15 mL Mouth Rinse BID  ? Chlorhexidine Gluconate Cloth  6 each Topical Q0600  ? citalopram  40 mg Per Tube Daily  ? cloNIDine  0.3 mg Per Tube TID  ? doxazosin  2 mg Per Tube Daily  ? feeding supplement (PROSource TF)  45 mL Per Tube TID  ? hydrALAZINE  100 mg Per Tube Q6H  ? insulin aspart  0-15 Units Subcutaneous Q4H  ? ipratropium-albuterol  3 mL Nebulization TID  ? liothyronine  5 mcg Per Tube QAC breakfast  ? mouth rinse  15 mL Mouth Rinse 10 times per day  ? minoxidil  5 mg Per Tube Daily  ? pantoprazole sodium  40 mg Per Tube Daily  ? polyethylene glycol  17 g Per Tube Daily  ? senna-docusate  1 tablet Per Tube BID  ? ?Claudia Desanctis, MD ?02/23/2022, 7:27 AM ? ? ?

## 2022-02-23 NOTE — Progress Notes (Signed)
Nutrition Follow-up ? ?DOCUMENTATION CODES:  ? ?Morbid obesity ? ?INTERVENTION:  ? ?Tube feeding via cortrak:  ? ?Increase Pivot 1.5 to 70 ml/h (1680 ml per day) ?Conintue Prosource TF 45 ml TID ? ?Provides 2640 kcal, 190 gm protein, 1275 ml free water daily ? ? ?NUTRITION DIAGNOSIS:  ? ?Increased nutrient needs related to  (COVID) as evidenced by estimated needs. ?Ongoing.  ? ?GOAL:  ? ?Patient will meet greater than or equal to 90% of their needs ?Met with TF at goal  ? ?MONITOR:  ? ?TF tolerance ? ?REASON FOR ASSESSMENT:  ? ?Consult, Ventilator ?Enteral/tube feeding initiation and management ? ?ASSESSMENT:  ? ?Pt with PMH of uncontrolled HTN admitted with acute R hemiparesis and found to have IPH, incidentally positive with COVID, now with COVID PNA.  ? ?Pt discussed during ICU rounds and with RN.  ?4/29 - admitted with IPH ?05/04 - intubated for acute respiratory failure with hypoxia and hypercapnia due to ARDS 2/2 COVID PNA, RLL CAP; failed BiPAP and HFNC ?05/05 cortrak placed; tip proximal to mid stomach  ? ?Admission wt: 161.6 kg ?Current wt: 153.3 kg ?+ 674 mL ?Moderate edema  ? ?Patient is currently intubated on ventilator support ?Temp (24hrs), Avg:97.6 ?F (36.4 ?C), Min:97.1 ?F (36.2 ?C), Max:97.8 ?F (36.6 ?C) ? ?Medications reviewed and include: SSI, protonix, miralax, senokot-s ?Fentanyl  ?Versed ?Cardene  ? ?Labs reviewed: PO4 6.7 ?TG: 335 ?Ferritin: 100 ?CRP: 9.7 (5/4) ?A1C: 5.5 ? ?CBG's: 126-197  ? ?UOP: 5 ml  ?UF: 8647 ml  ?I&O: +674 ml  ? ?Current TF:  ?Pivot 1.5 @ 65 with 45 ml ProSource TF TID ?Provides: 2460 kcal and 179 grams protein  ? ?Diet Order:   ?Diet Order   ? ?       ?  Diet NPO time specified  Diet effective now       ?  ? ?  ?  ? ?  ? ? ?EDUCATION NEEDS:  ? ?Not appropriate for education at this time ? ?Skin:  Skin Assessment: Reviewed RN Assessment ? ?Last BM:  5/4 ? ?Height:  ? ?Ht Readings from Last 1 Encounters:  ?02/17/22 _0  (1.753 m)  ? ? ?Weight:  ? ?Wt Readings from Last 1  Encounters:  ?02/23/22 (!) 153.3 kg  ? ? ?Ideal Body Weight:  72.7 kg  ? ?BMI:  Body mass index is 49.91 kg/m?. ? ?Estimated Nutritional Needs:  ? ?Kcal:  2200-2600 ? ?Protein:  160-185 grams ? ?Fluid:  >2 L/day ? ?Lockie Pares., RD, LDN, CNSC ?See AMiON for contact information  ? ?

## 2022-02-24 ENCOUNTER — Inpatient Hospital Stay (HOSPITAL_COMMUNITY): Payer: BC Managed Care – PPO

## 2022-02-24 ENCOUNTER — Encounter (HOSPITAL_COMMUNITY): Payer: Self-pay | Admitting: Neurology

## 2022-02-24 DIAGNOSIS — I255 Ischemic cardiomyopathy: Secondary | ICD-10-CM | POA: Diagnosis not present

## 2022-02-24 DIAGNOSIS — I10 Essential (primary) hypertension: Secondary | ICD-10-CM | POA: Diagnosis not present

## 2022-02-24 DIAGNOSIS — N170 Acute kidney failure with tubular necrosis: Secondary | ICD-10-CM | POA: Diagnosis not present

## 2022-02-24 DIAGNOSIS — R778 Other specified abnormalities of plasma proteins: Secondary | ICD-10-CM

## 2022-02-24 DIAGNOSIS — R9431 Abnormal electrocardiogram [ECG] [EKG]: Secondary | ICD-10-CM

## 2022-02-24 DIAGNOSIS — U071 COVID-19: Secondary | ICD-10-CM | POA: Diagnosis not present

## 2022-02-24 DIAGNOSIS — I610 Nontraumatic intracerebral hemorrhage in hemisphere, subcortical: Secondary | ICD-10-CM | POA: Diagnosis not present

## 2022-02-24 LAB — POCT I-STAT 7, (LYTES, BLD GAS, ICA,H+H)
Acid-Base Excess: 1 mmol/L (ref 0.0–2.0)
Acid-Base Excess: 3 mmol/L — ABNORMAL HIGH (ref 0.0–2.0)
Bicarbonate: 27.8 mmol/L (ref 20.0–28.0)
Bicarbonate: 28.3 mmol/L — ABNORMAL HIGH (ref 20.0–28.0)
Calcium, Ion: 1.18 mmol/L (ref 1.15–1.40)
Calcium, Ion: 1.22 mmol/L (ref 1.15–1.40)
HCT: 33 % — ABNORMAL LOW (ref 39.0–52.0)
HCT: 32 % — ABNORMAL LOW (ref 39.0–52.0)
Hemoglobin: 11.2 g/dL — ABNORMAL LOW (ref 13.0–17.0)
Hemoglobin: 10.9 g/dL — ABNORMAL LOW (ref 13.0–17.0)
O2 Saturation: 98 %
O2 Saturation: 95 %
Patient temperature: 97.5
Patient temperature: 98.6
Potassium: 5.2 mmol/L — ABNORMAL HIGH (ref 3.5–5.1)
Potassium: 4.7 mmol/L (ref 3.5–5.1)
Sodium: 135 mmol/L (ref 135–145)
Sodium: 136 mmol/L (ref 135–145)
TCO2: 29 mmol/L (ref 22–32)
TCO2: 30 mmol/L (ref 22–32)
pCO2 arterial: 49.1 mmHg — ABNORMAL HIGH (ref 32–48)
pCO2 arterial: 44.8 mmHg (ref 32–48)
pH, Arterial: 7.357 (ref 7.35–7.45)
pH, Arterial: 7.41 (ref 7.35–7.45)
pO2, Arterial: 118 mmHg — ABNORMAL HIGH (ref 83–108)
pO2, Arterial: 74 mmHg — ABNORMAL LOW (ref 83–108)

## 2022-02-24 LAB — GLUCOSE, CAPILLARY
Glucose-Capillary: 116 mg/dL — ABNORMAL HIGH (ref 70–99)
Glucose-Capillary: 124 mg/dL — ABNORMAL HIGH (ref 70–99)
Glucose-Capillary: 139 mg/dL — ABNORMAL HIGH (ref 70–99)
Glucose-Capillary: 140 mg/dL — ABNORMAL HIGH (ref 70–99)
Glucose-Capillary: 152 mg/dL — ABNORMAL HIGH (ref 70–99)
Glucose-Capillary: 162 mg/dL — ABNORMAL HIGH (ref 70–99)

## 2022-02-24 LAB — RENAL FUNCTION PANEL
Albumin: 2.8 g/dL — ABNORMAL LOW (ref 3.5–5.0)
Anion gap: 8 (ref 5–15)
BUN: 64 mg/dL — ABNORMAL HIGH (ref 6–20)
CO2: 25 mmol/L (ref 22–32)
Calcium: 8.8 mg/dL — ABNORMAL LOW (ref 8.9–10.3)
Chloride: 103 mmol/L (ref 98–111)
Creatinine, Ser: 3.44 mg/dL — ABNORMAL HIGH (ref 0.61–1.24)
GFR, Estimated: 23 mL/min — ABNORMAL LOW (ref >60)
Glucose, Bld: 156 mg/dL — ABNORMAL HIGH (ref 70–99)
Phosphorus: 4.1 mg/dL (ref 2.5–4.6)
Potassium: 4.9 mmol/L (ref 3.5–5.1)
Sodium: 136 mmol/L (ref 135–145)

## 2022-02-24 LAB — ECHOCARDIOGRAM COMPLETE
AV Mean grad: 11 mmHg
AV Peak grad: 19.7 mmHg
Ao pk vel: 2.22 m/s
Area-P 1/2: 3.08 cm2
Calc EF: 62.2 %
Height: 69 in
S' Lateral: 3.6 cm
Single Plane A2C EF: 60.2 %
Single Plane A4C EF: 65.5 %
Weight: 5537.96 oz

## 2022-02-24 LAB — TROPONIN I (HIGH SENSITIVITY)
Troponin I (High Sensitivity): 1447 ng/L (ref <18)
Troponin I (High Sensitivity): 1469 ng/L (ref <18)
Troponin I (High Sensitivity): 1769 ng/L (ref <18)

## 2022-02-24 LAB — MAGNESIUM: Magnesium: 3.4 mg/dL — ABNORMAL HIGH (ref 1.7–2.4)

## 2022-02-24 LAB — POCT ACTIVATED CLOTTING TIME: Activated Clotting Time: 0 seconds

## 2022-02-24 MED ORDER — PERFLUTREN LIPID MICROSPHERE
1.0000 mL | INTRAVENOUS | Status: AC | PRN
  Administered 2022-02-24: 2 mL via INTRAVENOUS
  Filled 2022-02-24: qty 10

## 2022-02-24 MED ORDER — SODIUM ZIRCONIUM CYCLOSILICATE 10 G PO PACK
10.0000 g | PACK | Freq: Once | ORAL | Status: AC
Start: 2022-02-24 — End: 2022-02-24
  Administered 2022-02-24: 10 g
  Filled 2022-02-24: qty 1

## 2022-02-24 MED ORDER — LORAZEPAM 2 MG/ML IJ SOLN
INTRAMUSCULAR | Status: AC
  Administered 2022-02-24: 2 mg via INTRAVENOUS
  Filled 2022-02-24: qty 1

## 2022-02-24 MED ORDER — METOCLOPRAMIDE HCL 5 MG/ML IJ SOLN
10.0000 mg | Freq: Four times a day (QID) | INTRAMUSCULAR | Status: AC
Start: 2022-02-24 — End: 2022-02-26
  Administered 2022-02-24 – 2022-02-26 (×8): 10 mg via INTRAVENOUS
  Filled 2022-02-24 (×8): qty 2

## 2022-02-24 MED ORDER — NOREPINEPHRINE 4 MG/250ML-% IV SOLN
INTRAVENOUS | Status: AC
  Filled 2022-02-24: qty 250

## 2022-02-24 MED ORDER — CHLORHEXIDINE GLUCONATE CLOTH 2 % EX PADS
6.0000 | MEDICATED_PAD | Freq: Every day | CUTANEOUS | Status: DC
  Administered 2022-02-26: 6 via TOPICAL

## 2022-02-24 MED ORDER — NOREPINEPHRINE 4 MG/250ML-% IV SOLN
0.0000 ug/min | INTRAVENOUS | Status: DC
  Administered 2022-02-24: 2 ug/min via INTRAVENOUS

## 2022-02-24 MED ORDER — LORAZEPAM 2 MG/ML IJ SOLN: 2.0000 mg | Freq: Once | INTRAMUSCULAR | Status: AC

## 2022-02-24 MED ORDER — NOREPINEPHRINE 16 MG/250ML-% IV SOLN
0.0000 ug/min | INTRAVENOUS | Status: DC
  Administered 2022-02-24: 40 ug/min via INTRAVENOUS
  Administered 2022-02-24: 18 ug/min via INTRAVENOUS
  Administered 2022-02-25: 22 ug/min via INTRAVENOUS
  Administered 2022-02-25: 40 ug/min via INTRAVENOUS
  Administered 2022-02-26: 23 ug/min via INTRAVENOUS
  Administered 2022-02-27: 13 ug/min via INTRAVENOUS
  Filled 2022-02-24 (×6): qty 250

## 2022-02-24 NOTE — Progress Notes (Signed)
While in pt room, RN observed sudden onset hypotension. BP had consistently been 140s/60s all night. At 0218 pressure dropped to 110/34 with map of 52. Pressures remained low with MAP in 40s and widened pulse pressure. Fentanyl paused and CRRT changed to no longer pull fluid. Neuro status assessed, pupils remain 3 round and sluggish, cough and L corneal still intact. Elink called, see new orders. Levo started and EKG obtained.  ? ?Fentanyl restarted at d/t pt double stacking the vent. Currently at 18 of Levo. RN will continue to monitor.  ? ? ?

## 2022-02-24 NOTE — Progress Notes (Signed)
Subjective: ?Sedated on vent.  Reports of hypotension and projectile vomiting overnight. Levophed gtt started and NG tube was changed to low intermittent wall suction.  CXR and KUB were performed and mostly unremarkable. ? ?Objective: ?Vital signs in last 24 hours: ?Temp:  [97.8 ?F (36.6 ?C)-98.5 ?F (36.9 ?C)] 98.5 ?F (36.9 ?C) (05/10 4580) ?Pulse Rate:  [39-67] 54 (05/10 0804) ?Resp:  [14-35] 35 (05/10 0804) ?SpO2:  [90 %-99 %] 93 % (05/10 0804) ?Arterial Line BP: (99-163)/(31-74) 149/53 (05/10 0730) ?FiO2 (%):  [50 %] 50 % (05/10 0804) ?Weight:  [157 kg] 157 kg (05/10 0500) ? ?Intake/Output from previous day: ?05/09 0701 - 05/10 0700 ?In: 3256.3 [I.V.:1396.3; NG/GT:1860] ?Out: 5779 [Emesis/NG output:1450] ?Intake/Output this shift: ?Total I/O ?In: 80 [I.V.:10; NG/GT:70] ?Out: 28 [Other:28] ? ?Physical Exam: Patient is intubated and sedated on Fentanyl. He is unresponsive. Minimal eye opening bilateraly to noxious stimuli. PERRL. 2-3 mm and sluggish. Dysconjugate gaze. Corneal reflexes diminished on the right, intact on the left. No spontaneous movement. Minimal withdraw in his left foot with noxious stimuli. No other movement with central or peripheral stimulation. Left craniectomy flap full but soft. Incisions are CDI.  ? ?Lab Results: ?Recent Labs  ?  02/22/22 ?0327 02/22/22 ?0420 02/23/22 ?1600 02/24/22 ?0406  ?WBC 13.9*  --   --   --   ?HGB 9.3*   < > 10.5* 11.2*  ?HCT 29.5*   < > 31.0* 33.0*  ?PLT 142*  --   --   --   ? < > = values in this interval not displayed.  ? ?BMET ?Recent Labs  ?  02/23/22 ?1532 02/23/22 ?1600 02/24/22 ?0406 02/24/22 ?0507  ?NA 140   < > 135 136  ?K 4.6   < > 5.2* 4.9  ?CL 101  --   --  103  ?CO2 27  --   --  25  ?GLUCOSE 137*  --   --  156*  ?BUN 63*  --   --  64*  ?CREATININE 3.71*  --   --  3.44*  ?CALCIUM 8.3*  --   --  8.8*  ? < > = values in this interval not displayed.  ? ? ?Studies/Results: ?DG Abd 1 View ? ?Result Date: 02/24/2022 ?CLINICAL DATA:  Acute respiratory failure.   Aspiration to airway. EXAM: ABDOMEN - 1 VIEW; PORTABLE CHEST - 1 VIEW COMPARISON:  02/23/2022, 02/19/2022. FINDINGS: The heart is enlarged and the mediastinal contour stable. The pulmonary vasculature is distended. Lung volumes are low and mild patchy airspace disease is present at the lung bases. No effusion or pneumothorax. The endotracheal tube terminates 4.5 cm above the carina. A right internal jugular central venous catheter terminates over the superior vena cava. The bowel gas pattern is normal. Surgical staples are present over the mid abdomen on the left. An enteric tube terminates in the gastric fundus. A feeding tube terminates in the distal stomach. IMPRESSION: 1. Cardiomegaly with pulmonary vascular congestion. 2. Low lung volumes with atelectasis or infiltrate at the lung bases. 3. Support devices as described above. Electronically Signed   By: Thornell Sartorius M.D.   On: 02/24/2022 03:59  ? ?DG Chest Port 1 View ? ?Result Date: 02/24/2022 ?CLINICAL DATA:  Acute respiratory failure.  Aspiration to airway. EXAM: ABDOMEN - 1 VIEW; PORTABLE CHEST - 1 VIEW COMPARISON:  02/23/2022, 02/19/2022. FINDINGS: The heart is enlarged and the mediastinal contour stable. The pulmonary vasculature is distended. Lung volumes are low and mild patchy airspace disease is present  at the lung bases. No effusion or pneumothorax. The endotracheal tube terminates 4.5 cm above the carina. A right internal jugular central venous catheter terminates over the superior vena cava. The bowel gas pattern is normal. Surgical staples are present over the mid abdomen on the left. An enteric tube terminates in the gastric fundus. A feeding tube terminates in the distal stomach. IMPRESSION: 1. Cardiomegaly with pulmonary vascular congestion. 2. Low lung volumes with atelectasis or infiltrate at the lung bases. 3. Support devices as described above. Electronically Signed   By: Thornell Sartorius M.D.   On: 02/24/2022 03:59  ? ?DG CHEST PORT 1  VIEW ? ?Result Date: 02/23/2022 ?CLINICAL DATA:  Respiratory failure. EXAM: PORTABLE CHEST 1 VIEW COMPARISON:  02/21/2022 FINDINGS: 0846 hours. Endotracheal tube tip is 3.6 cm above the base of the carina. A feeding tube passes into the stomach although the distal tip position is not included on the film. Right IJ central line tip overlies the proximal SVC level. Left IJ central line tip is positioned at the innominate vein confluence. The cardio pericardial silhouette is enlarged. There is pulmonary vascular congestion without overt pulmonary edema. Basilar atelectasis noted without substantial pleural effusion. IMPRESSION: 1. Support apparatus as described. 2. Enlarged cardiopericardial silhouette with pulmonary vascular congestion. 3. Basilar atelectasis. No overt pulmonary edema or substantial pleural effusion. Electronically Signed   By: Kennith Center M.D.   On: 02/23/2022 09:01   ? ?Assessment/Plan: ?36 y.o. 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 He is postop day # 4 s/p left hemicraniectomy on 03/07/2022. He continues to be sedated and on the ventilator. CRRT continues. Reports of patient with projectile vomiting overnight with hypotension that required a Levophed gtt. Overnight workup was mostly unremarkable. Patient sent for [facility] this morning due to overnight events and for stability purposes. CT head is stable. MLS now 6-7 mm. Area of increased swelling around the hemorrhage. Neurological examination is stable. No new neurosurgical recommendations at this time. Continue supportive care ? ? ? LOS: 11 days  ? ? ? ?Council Mechanic, DNP, NP-C ?02/24/2022, 8:16 AM ? ? ? ? ?

## 2022-02-24 NOTE — Progress Notes (Signed)
eLink Physician-Brief Progress Note ?Patient Name: Clinton Taylor ?DOB: 1985-12-24 ?MRN: [id number] ? ? ?Date of Service ? 02/24/2022  ?HPI/Events of Note ? CXR and KUB reviewed, they are both under-whelming.  ?eICU Interventions ? Stat echo ordered for the AM looking for RWMA.  ? ? ? ?  ? ?Migdalia Dk ?02/24/2022, 4:31 AM ?

## 2022-02-24 NOTE — Progress Notes (Signed)
eLink Physician-Brief Progress Note ?Patient Name: Clinton Taylor ?DOB: 10-12-1986 ?MRN: [id number] ? ? ?Date of Service ? 02/24/2022  ?HPI/Events of Note ? Patient with abrupt onset of hypotension and bradycardia while receiving CRRT with ultrafiltration.  ?eICU Interventions ? Ultrafiltration stopped, Norepinephrine gtt ordered, troponin cycled, stat 12 lead EKG.  ? ? ? ?  ? ?Frederik Pear ?02/24/2022, 2:35 AM ?

## 2022-02-24 NOTE — Progress Notes (Signed)
STROKE TEAM PROGRESS NOTE  ? ?INTERVAL HISTORY ?Patient remains intubated on sedation with fentanyl. and Versed.  Neurological exam remains limited due to sedation.  Patient remains on CRRT for acute renal failure.  Blood pressure is better controlled .  He had episode of vomiting yesterday and NG tube was changed to low intermittent wall suction.  Levophed drip was started briefly and weaned off and hypertension is believed to be medication related.  However EKG showed sinus bradycardia with hyperacute T waves and cardiology has been consulted.  CT scan of the head was done this morning which shows stable appearance of his large hemorrhage with post hemicraniectomy changes with stable 8 mm left-to-right shift. ?  ?Vitals:  ? 02/24/22 1110 02/24/22 1115 02/24/22 1416 02/24/22 1421  ?BP:      ?Pulse: (!) 59 (!) 57  74  ?Resp: (!) 33 (!) 23  (!) 35  ?Temp:      ?TempSrc:      ?SpO2: 96% 96% 97% 97%  ?Weight:      ?Height:      ? ?CBC:  ?Recent Labs  ?Lab 02/19/22 ?0230 02/19/22 ?[id number] 02/19/22 ?1152 02/21/22 ?[id number] 02/21/22 ?[id number] 02/22/22 ?0327 02/22/22 ?[id number] 02/24/22 ?0406 02/24/22 ?1319  ?WBC 11.0* 11.6*  --  12.1*  --  13.9*  --   --   --   ?NEUTROABS 9.4* 9.5*  --   --   --   --   --   --   --   ?HGB 11.3* 11.2*   < > 9.8*   < > 9.3*   < > 11.2* 10.9*  ?HCT 39.8 40.7   < > 31.0*   < > 29.5*   < > 33.0* 32.0*  ?MCV 88.6 89.6  --  81.8  --  79.9*  --   --   --   ?PLT 221 208  --  121*  --  142*  --   --   --   ? < > = values in this interval not displayed.  ? ?Basic Metabolic Panel:  ?Recent Labs  ?Lab 02/23/22 ?[id number] 02/23/22 ?0804 02/23/22 ?1532 02/23/22 ?1600 02/24/22 ?0232 02/24/22 ?0406 02/24/22 ?0507 02/24/22 ?1319  ?NA 140   < > 140   < >  --    < > 136 136  ?K 4.1   < > 4.6   < >  --    < > 4.9 4.7  ?CL 95*  --  101  --   --   --  103  --   ?CO2 34*  --  27  --   --   --  25  --   ?GLUCOSE 166*  --  137*  --   --   --  156*  --   ?BUN 68*  --  63*  --   --   --  64*  --   ?CREATININE 4.42*  --  3.71*  --   --    --  3.44*  --   ?CALCIUM 8.6*  --  8.3*  --   --   --  8.8*  --   ?MG 3.4*  --   --   --  3.4*  --   --   --   ?PHOS 6.7*  --  4.6  --   --   --  4.1  --   ? < > = values in this interval not displayed.  ? ?Lipid Panel:  ?Recent Labs  ?Lab  02/21/22 ?0436  ?TRIG 335*  ? ?HgbA1c:  ?No results for input(s): HGBA1C in the last 168 hours. ? ?Urine Drug Screen:  ?No results for input(s): LABOPIA, COCAINSCRNUR, LABBENZ, AMPHETMU, THCU, LABBARB in the last 168 hours. ?  ?Alcohol Level  ?No results for input(s): ETH in the last 168 hours. ? ? ?IMAGING past 24 hours ?DG Abd 1 View ? ?Result Date: 02/24/2022 ?CLINICAL DATA:  Acute respiratory failure.  Aspiration to airway. EXAM: ABDOMEN - 1 VIEW; PORTABLE CHEST - 1 VIEW COMPARISON:  02/23/2022, 02/19/2022. FINDINGS: The heart is enlarged and the mediastinal contour stable. The pulmonary vasculature is distended. Lung volumes are low and mild patchy airspace disease is present at the lung bases. No effusion or pneumothorax. The endotracheal tube terminates 4.5 cm above the carina. A right internal jugular central venous catheter terminates over the superior vena cava. The bowel gas pattern is normal. Surgical staples are present over the mid abdomen on the left. An enteric tube terminates in the gastric fundus. A feeding tube terminates in the distal stomach. IMPRESSION: 1. Cardiomegaly with pulmonary vascular congestion. 2. Low lung volumes with atelectasis or infiltrate at the lung bases. 3. Support devices as described above. Electronically Signed   By: Brett Fairy M.D.   On: 02/24/2022 03:59  ? ?CT HEAD WO CONTRAST (5MM) ? ?Result Date: 02/24/2022 ?CLINICAL DATA:  Subdural hematoma. Postoperative day 4 status post left hemicraniectomy. EXAM: CT HEAD WITHOUT CONTRAST TECHNIQUE: Contiguous axial images were obtained from the base of the skull through the vertex without intravenous contrast. RADIATION DOSE REDUCTION: This exam was performed according to the departmental  dose-optimization program which includes automated exposure control, adjustment of the mA and/or kV according to patient size and/or use of iterative reconstruction technique. COMPARISON:  CT brain 02/21/2022 FINDINGS: The patient is intubated. Partial visualization of nasogastric tube. Brain: Known lobular hyperdense acute to subacute hemorrhage centered within the left putamen and globus pallidus and extending more posteriorly within the centrum semiovale white matter is very similar to prior measuring up to approximately 6.0 x 3.8 x 4.8 cm (transverse by AP by craniocaudal), not significant changed by my measurements on the prior 02/21/2022 CT. There is increased surrounding moderate marrow edema similar around the hemorrhage but more extensively extending into the more anterior left frontal lobe (axial series 3, image 26). There is similar mass effect on the left lateral ventricle. Unchanged 8 mm left-to-right midline shift. The third ventricle is again narrowed. The fourth ventricle and basilar cisterns again appear decompressed. No ventriculomegaly. The right hemisphere cortical gray-white interface remains intact. Vascular: No hyperdense vessel or unexpected calcification. Skull: Postsurgical changes of left frontotemporal craniectomy. Associated hyperdense anterior greater than posterior frontal and parietal subdural hematoma measuring up to 15 mm in transverse thickness on coronal series 5, image 31, unchanged from 02/21/2022. Mild interval decrease in overlying postoperative scattered extra-axial air at the anterior aspect of the craniectomy. There again lateral scalp skin staples. Sinuses/Orbits: The visualized orbits are unremarkable. Mild right frontal, moderate to high-grade ethmoid air cell, high-grade right and moderate left maxillary, and high-grade sphenoid sinus mucosal opacification, overall increased from prior. Moderate opacification of the bilateral mastoid air cells is mildly worsened from  prior. Other: None. IMPRESSION: Compared to 02/21/2022: 1. Redemonstration of left frontotemporal craniectomy. 2. Known acute to subacute left cerebral hemisphere hemorrhage is very similar in size to most recent CT 02/21/2022. Stable 8 mm left-to-right midline shift. 3. Interval increase in edema surrounding the hematoma especially extending into the anterior  left frontal white matter. 4. Interval increase in paranasal sinus opacification. Electronically Signed   By: Yvonne Kendall M.D.   On: 02/24/2022 10:13  ? ?DG Chest Port 1 View ? ?Result Date: 02/24/2022 ?CLINICAL DATA:  Acute respiratory failure.  Aspiration to airway. EXAM: ABDOMEN - 1 VIEW; PORTABLE CHEST - 1 VIEW COMPARISON:  02/23/2022, 02/19/2022. FINDINGS: The heart is enlarged and the mediastinal contour stable. The pulmonary vasculature is distended. Lung volumes are low and mild patchy airspace disease is present at the lung bases. No effusion or pneumothorax. The endotracheal tube terminates 4.5 cm above the carina. A right internal jugular central venous catheter terminates over the superior vena cava. The bowel gas pattern is normal. Surgical staples are present over the mid abdomen on the left. An enteric tube terminates in the gastric fundus. A feeding tube terminates in the distal stomach. IMPRESSION: 1. Cardiomegaly with pulmonary vascular congestion. 2. Low lung volumes with atelectasis or infiltrate at the lung bases. 3. Support devices as described above. Electronically Signed   By: Brett Fairy M.D.   On: 02/24/2022 03:59  ? ?ECHOCARDIOGRAM COMPLETE ? ?Result Date: 02/24/2022 ?   ECHOCARDIOGRAM REPORT   Patient Name:   Clinton Taylor Date of Exam: 02/24/2022 Medical Rec #:  [id number]        Height:       69.0 in Accession #:    [id number]       Weight:       346.1 lb Date of Birth:  04-02-1986       BSA:          2.608 m? Patient Age:    8 years         BP:           156/54 mmHg Patient Gender: M                HR:           55 bpm. Exam  Location:  Inpatient Procedure: 2D Echo, Color Doppler, Limited Color Doppler and Intracardiac            Opacification Agent Indications:    Cardiomyopathy-Ischemic  History:        Patient has prior histo
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
## 2022-02-24 NOTE — Progress Notes (Signed)
RT note. ?Patient transported to CT and back without any complications at this time.  ?

## 2022-02-24 NOTE — IPAL (Signed)
Interdisciplinary Goals of Care Family Meeting ? ? ?Date carried out:: 02/24/2022 ? ?Location of the meeting: Bedside ? ?Member's involved: Physician, Bedside Registered Nurse, and Family Member or next of kin ? ?Durable Power of Attorney or acting medical decision maker: Chadwich Mariscal ? ?Discussion: We discussed goals of care for [facility] .   ? ?The Clinical status was relayed to patient's significant other and his father at bedside in detail. ?  ?Updated and notified of patients medical condition. ?  ?Patient is having a weak cough and struggling to remove secretions.   ?Patient with increased WOB and using accessory muscles to breathe ?Explained to family course of therapy and the modalities  ?Signs of brain damage ?Evidence of kidney failure ?  ?Patient with multiple organ dysfunction/failure will remain debilitated and dependent probably for the rest of his life despite all aggressive and optimal medical therapy. ? ?Code status: Full Code, ? ?Disposition: Continue current acute care, patient significant other would like to proceed with tracheostomy or any other procedure needed ? ?  ?Family are satisfied with Plan of action and management. All questions answered ?  ?Jacky Kindle MD ?[facility] ?See Amion for pager ?If no response to pager, please call [phone] until 7pm ?After 7pm, Please call E-link [phone] ? ? ? ?

## 2022-02-24 NOTE — Consult Note (Addendum)
?Cardiology Consultation:  ? ?Patient ID: Clinton Taylor ?MRN: [id number]; DOB: Apr 28, 1986 ? ?Admit date: 01/17/2022 ?Date of Consult: 02/24/2022 ? ?PCP:  [name], MD ?  ?[facility] Providers ?Cardiologist:  New to Dr. Angelena Form ?Click here to update MD or APP on Care Team, Refresh:1}   ? ? ?Patient Profile:  ? ?Clinton Taylor is a 36 y.o. male with a hx of HTN, HLD, morbid obesity, mood disorder, former smoking, OSA on CPAP, no prior cardiac history who is being seen 02/24/2022 for the evaluation of abnormal EKG/elevated troponin at the request of Dr. Tacy Learn. ? ?History of Present Illness:  ? ?Mr. [name] has no prior history of CAD, although wife at bedside recalls many years ago before they got married he was told he had an enlarged heart. She said he was recently told that he had pre-diabetes and that kidneys were stressed and he needed to get serious about his health. He was following with a weight loss program called [facility] and had started [place] and had begun losing weight the last few weeks. She has heard from family members that he has a lot of cardiovascular disease in various men in his family. ? ?He was in his USOH day of admission when he suddenly developed right sided weakness while driving. He was able to park in the driveway at home and EMS was activated. He had emesis in the EMS truck. He was initially taken to [facility] where head CT revealed an intracranial hemorrhage for which he was transferred to [facility] for further management. 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 Hospital course has been complicated by acute respiratory failure due to severe ARDS due to Covid-19 PNA, aspiration PNA, and acute respiratory acidosis. He had had symptoms recently but was reported to have taken a home test that was negative. There were coworkers  with Covid. PCR here was positive. CT 5/7 showed worsening mass effect and he was taken for emergent L hemicraniectomy. He's alo suffered from AKI this admission with peak Cr 7.10 (currently 3.44). He was placed on CRRT. PCCM/nephrology have been managing his hypertensives. Last night he suddenly became hypotensive and required IV Levophed. He had vomited at the same time and had crushed medications in the vomitus - it was felt that all of the medicines working at the same time led to the hypotension. His meds were subsequently adjusted and he was able wean off Levophed. Given the change in status, troponins were obtained which were 1447->1469 (87 on 4/29). We were contacted for consult due to elevated troponin and abnormal EKG which showed sinus bradycardia with hyperacute appearing T waves in precordial leads, otherwise nonspecific STTW changes, QTc 330m. Echo was performed today showing normal LV function, moderate LVH, g1DD, normal RV, no significant valve disease; no major change from initial echo 02/14/22. Interdisciplinary GOC family meeting reviewed with concerns of multiorgan dysfunction, struggling to remove secretions, increased WOB, and signs of brain damage. He remains intubated, sedated at this time. ? ? ?Past Medical History:  ?Diagnosis Date  ? Anger   ? Anxiety   ? Depression   ? Enlarged heart   ? Hiatal hernia   ? Hypertension   ? Mood changes   ? Morbid obesity (HCementon   ? Obsessive compulsive disorder   ? OSA on CPAP   ? ? ?Past Surgical History:  ?Procedure Laterality Date  ?  CRANIOTOMY Left 03/09/2022  ? Procedure: HEMICRANIECTOMY, Placement of Skull Flap in Abdomen;  Surgeon: Dawley, Theodoro Doing, DO;  Location: [facility];  Service: Neurosurgery;  Laterality: Left;  ?  ? ?Home Medications:  ?Prior to Admission medications   ?Medication Sig Start Date End Date Taking? Authorizing Provider  ?amLODipine (NORVASC) 10 MG tablet TAKE 1 TABLET(10 MG) BY MOUTH DAILY ?Patient taking differently: Take 10 mg by mouth  daily. 12/17/21  Yes Sagardia, Ines Bloomer, MD  ?ARIPiprazole (ABILIFY) 10 MG tablet Take 10 mg by mouth daily. 01/15/22  Yes [provider]  ?citalopram (CELEXA) 40 MG tablet TAKE 1/2 TABLET(20 MG) BY MOUTH DAILY ?Patient taking differently: Take 40 mg by mouth daily. 06/02/21  Yes [name], MD  ?liothyronine (CYTOMEL) 5 MCG tablet Take 5 mcg by mouth daily before breakfast. 01/28/22  Yes [provider]  ?losartan-hydrochlorothiazide (HYZAAR) 100-12.5 MG tablet Take 1 tablet by mouth daily. 03/13/21  Yes [name], MD  ?SAXENDA 18 MG/3ML SOPN Inject 0.6 mg into the skin daily. 02/09/22  Yes [provider]  ?traZODone (DESYREL) 50 MG tablet Take 1 tablet (50 mg total) by mouth at bedtime as needed for sleep. 06/23/21 02/04/2022 Yes Sagardia, Ines Bloomer, MD  ?cyclobenzaprine (FLEXERIL) 10 MG tablet TAKE 1 TABLET(10 MG) BY MOUTH THREE TIMES DAILY AS NEEDED FOR MUSCLE SPASMS ?Patient not taking: Reported on 01/30/2022 08/25/21   [name], MD  ?lamoTRIgine (LAMICTAL) 100 MG tablet Take 100 mg by mouth daily. ?Patient not taking: Reported on 02/03/2022 08/26/19   [provider]  ?neomycin-polymyxin b-dexamethasone (MAXITROL) 3.5-10000-0.1 SUSP Place 2 drops into both eyes every 6 (six) hours. ?Patient not taking: Reported on 02/04/2022 01/18/22   Janith Lima, MD  ?nicotine (NICODERM CQ) 14 mg/24hr patch Place 1 patch (14 mg total) onto the skin daily. ?Patient not taking: Reported on 01/29/2022 06/23/21   [name], MD  ? ? ?Inpatient Medications: ?Scheduled Meds: ? ARIPiprazole  10 mg Per Tube Daily  ? chlorhexidine gluconate (MEDLINE KIT)  15 mL Mouth Rinse BID  ? Chlorhexidine Gluconate Cloth  6 each Topical Q0600  ? citalopram  40 mg Per Tube Daily  ? feeding supplement (PROSource TF)  45 mL Per Tube TID  ? insulin aspart  0-15 Units Subcutaneous Q4H  ? ipratropium-albuterol  3 mL Nebulization TID  ? liothyronine  5 mcg Per Tube QAC breakfast  ?  mouth rinse  15 mL Mouth Rinse 10 times per day  ? metoCLOPramide (REGLAN) injection  10 mg Intravenous Q6H  ? pantoprazole sodium  40 mg Per Tube Daily  ? polyethylene glycol  17 g Per Tube Daily  ? senna-docusate  1 tablet Per Tube BID  ? ?Continuous Infusions: ? feeding supplement (PIVOT 1.5 CAL) 1,000 mL (02/23/22 1400)  ? fentaNYL infusion INTRAVENOUS 100 mcg/hr (02/24/22 1100)  ? norepinephrine (LEVOPHED) Adult infusion 18 mcg/min (02/24/22 0531)  ? norepinephrine    ? ?PRN Meds: ?acetaminophen **OR** acetaminophen (TYLENOL) oral liquid 160 mg/5 mL **OR** acetaminophen, fentaNYL, guaiFENesin-dextromethorphan, labetalol ? ?Allergies:   No Known Allergies ? ?Social History:   ?Social History  ? ?Socioeconomic History  ? Marital status: Married  ?  Spouse name: Not on file  ? Number of children: Not on file  ? Years of education: Not on file  ? Highest education level: Not on file  ?Occupational History  ? Not on file  ?Tobacco Use  ? Smoking status: Former  ?  Packs/day: 1.00  ?  Types: Cigarettes  ? Smokeless tobacco: Former  ?  Types: Chew  ?Vaping Use  ? Vaping Use: Every day  ?Substance and Sexual Activity  ? Alcohol use: No  ?  Comment: socially  ? Drug use: No  ?  Types: Marijuana  ? Sexual activity: Yes  ?Other Topics Concern  ? Not on file  ?Social History Narrative  ? Not on file  ? ?Social Determinants of Health  ? ?Financial Resource Strain: Not on file  ?Food Insecurity: Not on file  ?Transportation Needs: Not on file  ?Physical Activity: Not on file  ?Stress: Not on file  ?Social Connections: Not on file  ?Intimate Partner Violence: Not on file  ?  ?Family History:   ? ?Family History  ?Problem Relation Age of Onset  ? OCD Mother   ? Alcohol abuse Father   ? Bipolar disorder Father   ? Drug abuse Father   ? Alcohol abuse Maternal Uncle   ? Alcohol abuse Paternal Uncle   ? OCD Maternal Grandfather   ? Alcohol abuse Paternal Grandmother   ? Drug abuse Paternal Grandmother   ?  ? ?ROS:  ?Unable to  obtain due to intubation ? ?Physical Exam/Data:  ? ?Vitals:  ? 02/24/22 1110 02/24/22 1115 02/24/22 1416 02/24/22 1421  ?BP:      ?Pulse: (!) 59 (!) 57  74  ?Resp: (!) 33 (!) 23  (!) 35  ?Temp:      ?TempS

## 2022-02-24 NOTE — Progress Notes (Signed)
Patient ID: Clinton Taylor, male   DOB: 07-Jun-1986, 36 y.o.   MRN: [id number] ?[facility] ?Progress Note  ? ?Assessment/ Plan:   ?1.  Acute kidney injury on chronic kidney disease stage IIIb (labs from last month showed creatinine 2.39): Acute kidney injury likely from ischemic ATN (hemodynamic fluctuations) +/- contrast nephropathy.  Started on CRRT after he developed worsening renal function and multiple electrolyte abnormalities in the setting of worsening respiratory failure.  ?- he is going down for a head CT  ?- stop CRRT and do not restart for now ?- plan for HD tomorrow for clearance   ?- lokelma once  ? ?2.  Hypernatremia: was medically induced with hypertonic saline to mitigate risk from cerebral edema.  Status post hemicraniectomy and note transient hypertonic saline administered preoperatively. ? ?3.  Hypertensive emergency with acute left-sided intracerebral hemorrhage: With acute decompensation due to worsening mass effect with left-to-right shift and underwent emergent left hemicraniectomy to relieve intracranial pressure.  Did not undergo evacuation of hematoma based on physical exam intraoperatively.  ?- note titration of clonidine and recent medication adjustments ?- now with hypotension - large volume emesis with partially digested pills indicates that he wasn't actually getting full effect of his medications  ?- optimize volume with CRRT  ?- stop oral agents and cardene gtt as pt on levo.  When agents are added back would avoid lisinopril and spironolactone ? ?4.  Acute hypoxic respiratory failure: Decadron treatment for COVID-19 infection and community-acquired pneumonia on ceftriaxone and azithromycin. ?- on vent per primary team   ? ?5. Covid PNA ?- therapies per primary team ? ?6. CKD stage 3 ?- last last month with Cr 2.39 per charting ? ?7. Hyperphosphatemia  ?- setting of AKI  ?- improved s/p CRRT ? ?8. Microcytic anemia  ?- no acute indication for PRBC's ?- improved and  mild  ? ?Disposition - in the ICU on CRRT  ? ? ? ?Subjective:   ? ?Seen and examined on CRRT. Procedure supervised.  Patient with 4.3 kg UF over 5/9 charted with CRRT.  anuric over 5/9.  Per RN his was hypotensive yesterday.  They were able to wean the cardene off.  He had a large volume of emesis at one point with almost 2 liters of gastric contents which contained partially digested pills.  He was started on levo overnight and is at 15 mcg/min. UF was stopped for CRRT.  Team is trending troponins.    ? ?Per nursing they are about to go down for a head CT.  Spoke with pulm and we are in agreement about pausing CRRT and transitioning to IHD for treatment tomorrow  ? ?Review of systems:   ?Unable to obtain given intubated   ? ?Objective:   ?BP (!) 157/67   Pulse (!) 54   Temp 97.8 ?F (36.6 ?C) (Axillary)   Resp (!) 27   Ht 5\' 9"  (1.753 m)   Wt (!) 157 kg   SpO2 91%   BMI 51.11 kg/m?  ? ?Intake/Output Summary (Last 24 hours) at 02/24/2022 0758 ?Last data filed at 02/24/2022 0700 ?Gross per 24 hour  ?Intake 3256.31 ml  ?Output 5779 ml  ?Net -2522.69 ml  ? ?Weight change: 3.7 kg ? ?Physical Exam:   ?Gen: Intubated and critically ill    ?HEENT status post left hemicraniectomy ?CVS: HR low 50's on my exam, S1 and S2. No rub ?Resp: coarse mechanical breath sounds on vent ?Abd: Soft, obese, nontender ?Ext: trace to 1+ bilateral  lower extremity edema, 1+ upper ext edema. ?Neuro - sedation is currently running and does not respond to commands ?Access: RIJ nontunn HD catheter in use ? ? ?Imaging: ?DG Abd 1 View ? ?Result Date: 02/24/2022 ?CLINICAL DATA:  Acute respiratory failure.  Aspiration to airway. EXAM: ABDOMEN - 1 VIEW; PORTABLE CHEST - 1 VIEW COMPARISON:  02/23/2022, 02/19/2022. FINDINGS: The heart is enlarged and the mediastinal contour stable. The pulmonary vasculature is distended. Lung volumes are low and mild patchy airspace disease is present at the lung bases. No effusion or pneumothorax. The endotracheal  tube terminates 4.5 cm above the carina. A right internal jugular central venous catheter terminates over the superior vena cava. The bowel gas pattern is normal. Surgical staples are present over the mid abdomen on the left. An enteric tube terminates in the gastric fundus. A feeding tube terminates in the distal stomach. IMPRESSION: 1. Cardiomegaly with pulmonary vascular congestion. 2. Low lung volumes with atelectasis or infiltrate at the lung bases. 3. Support devices as described above. Electronically Signed   By: Brett Fairy M.D.   On: 02/24/2022 03:59  ? ?DG Chest Port 1 View ? ?Result Date: 02/24/2022 ?CLINICAL DATA:  Acute respiratory failure.  Aspiration to airway. EXAM: ABDOMEN - 1 VIEW; PORTABLE CHEST - 1 VIEW COMPARISON:  02/23/2022, 02/19/2022. FINDINGS: The heart is enlarged and the mediastinal contour stable. The pulmonary vasculature is distended. Lung volumes are low and mild patchy airspace disease is present at the lung bases. No effusion or pneumothorax. The endotracheal tube terminates 4.5 cm above the carina. A right internal jugular central venous catheter terminates over the superior vena cava. The bowel gas pattern is normal. Surgical staples are present over the mid abdomen on the left. An enteric tube terminates in the gastric fundus. A feeding tube terminates in the distal stomach. IMPRESSION: 1. Cardiomegaly with pulmonary vascular congestion. 2. Low lung volumes with atelectasis or infiltrate at the lung bases. 3. Support devices as described above. Electronically Signed   By: Brett Fairy M.D.   On: 02/24/2022 03:59  ? ?DG CHEST PORT 1 VIEW ? ?Result Date: 02/23/2022 ?CLINICAL DATA:  Respiratory failure. EXAM: PORTABLE CHEST 1 VIEW COMPARISON:  02/21/2022 FINDINGS: 0846 hours. Endotracheal tube tip is 3.6 cm above the base of the carina. A feeding tube passes into the stomach although the distal tip position is not included on the film. Right IJ central line tip overlies the proximal  SVC level. Left IJ central line tip is positioned at the innominate vein confluence. The cardio pericardial silhouette is enlarged. There is pulmonary vascular congestion without overt pulmonary edema. Basilar atelectasis noted without substantial pleural effusion. IMPRESSION: 1. Support apparatus as described. 2. Enlarged cardiopericardial silhouette with pulmonary vascular congestion. 3. Basilar atelectasis. No overt pulmonary edema or substantial pleural effusion. Electronically Signed   By: Misty Stanley M.D.   On: 02/23/2022 09:01   ? ?Labs: ?BMET ?Recent Labs  ?Lab 02/21/22 ?0436 02/21/22 ?0839 02/21/22 ?1642 02/21/22 ?2249 02/22/22 ?0327 02/22/22 ?0420 02/22/22 ?1553 02/23/22 ?0022 02/23/22 ?[id number] 02/23/22 ?0804 02/23/22 ?1532 02/23/22 ?1600 02/24/22 ?0406 02/24/22 ?0507  ?NA 149*  148*   < > 147*   < > 143   < > 140 138 140 138 140 138 135 136  ?K 4.7   < > 4.3  --  3.7   < > 3.5 3.6 4.1 4.2 4.6 4.5 5.2* 4.9  ?CL 104  --  101  --  90*  --  83*  --  95*  --  101  --   --  103  ?CO2 33*  --  32  --  38*  --  42*  --  34*  --  27  --   --  25  ?GLUCOSE 181*  --  162*  --  150*  --  158*  --  166*  --  137*  --   --  156*  ?BUN 81*  --  103*  --  86*  --  71*  --  68*  --  63*  --   --  64*  ?CREATININE 6.07*  --  7.08*  --  5.92*  --  4.77*  --  4.42*  --  3.71*  --   --  3.44*  ?CALCIUM 7.9*  --  8.0*  --  7.6*  --  7.9*  --  8.6*  --  8.3*  --   --  8.8*  ?PHOS 8.7*  8.7*  --  8.9*  9.0*  --  6.6*  --  6.6*  --  6.7*  --  4.6  --   --  4.1  ? < > = values in this interval not displayed.  ? ?CBC ?Recent Labs  ?Lab 02/18/22 ?0411 02/18/22 ?1246 02/19/22 ?0230 02/19/22 ?[id number] 02/19/22 ?1152 02/21/22 ?[id number] 02/21/22 ?[id number] 02/22/22 ?0327 02/22/22 ?[id number] 02/23/22 ?0022 02/23/22 ?[id number] 02/23/22 ?1600 02/24/22 ?0406  ?WBC 8.4  --  11.0* 11.6*  --  12.1*  --  13.9*  --   --   --   --   --   ?NEUTROABS 7.2  --  9.4* 9.5*  --   --   --   --   --   --   --   --   --   ?HGB 11.8*   < > 11.3* 11.2*   < > 9.8*   < > 9.3*   <  > 11.2* 11.2* 10.5* 11.2*  ?HCT 38.5*   < > 39.8 40.7   < > 31.0*   < > 29.5*   < > 33.0* 33.0* 31.0* 33.0*  ?MCV 81.7  --  88.6 89.6  --  81.8  --  79.9*  --   --   --   --   --   ?PLT 236  --  221 208  --  121*

## 2022-02-24 NOTE — Progress Notes (Signed)
?  Echocardiogram ?2D Echocardiogram has been performed. ? ?Clinton Taylor  Clinton Taylor ?02/24/2022, 9:08 AM ?

## 2022-02-24 NOTE — Progress Notes (Signed)
0310 This RN at the bedside assessing pt pupils when the pt began to projectile vomit around the ETT. Large volume of dark brown, foul-smelling emesis with visible crushed pills. TF turned off. Elink on the camera. Verbal order to place OG to LIS. New order for CXR and KUB.  ? ?Pt given a bath. R IJ HD cath soiled with emesis. This RN changed the dressing with help from Lyden, RN and Jomarie Longs, Charity fundraiser. Cap changed on purple lumen. All lumens wiped down with CHG.  ? ?VSS on levo. RN will continue to monitor.  ?

## 2022-02-25 ENCOUNTER — Inpatient Hospital Stay (HOSPITAL_COMMUNITY): Payer: BC Managed Care – PPO

## 2022-02-25 DIAGNOSIS — I10 Essential (primary) hypertension: Secondary | ICD-10-CM | POA: Diagnosis not present

## 2022-02-25 DIAGNOSIS — N170 Acute kidney failure with tubular necrosis: Secondary | ICD-10-CM | POA: Diagnosis not present

## 2022-02-25 DIAGNOSIS — U071 COVID-19: Secondary | ICD-10-CM | POA: Diagnosis not present

## 2022-02-25 DIAGNOSIS — I610 Nontraumatic intracerebral hemorrhage in hemisphere, subcortical: Secondary | ICD-10-CM | POA: Diagnosis not present

## 2022-02-25 LAB — CBC WITH DIFFERENTIAL/PLATELET
Abs Immature Granulocytes: 0 10*3/uL (ref 0.00–0.07)
Basophils Absolute: 0 10*3/uL (ref 0.0–0.1)
Basophils Relative: 0 %
Eosinophils Absolute: 0.3 10*3/uL (ref 0.0–0.5)
Eosinophils Relative: 1 %
HCT: 34.6 % — ABNORMAL LOW (ref 39.0–52.0)
Hemoglobin: 10.9 g/dL — ABNORMAL LOW (ref 13.0–17.0)
Lymphocytes Relative: 15 %
Lymphs Abs: 4.4 10*3/uL — ABNORMAL HIGH (ref 0.7–4.0)
MCH: 24.8 pg — ABNORMAL LOW (ref 26.0–34.0)
MCHC: 31.5 g/dL (ref 30.0–36.0)
MCV: 78.8 fL — ABNORMAL LOW (ref 80.0–100.0)
Monocytes Absolute: 2.6 10*3/uL — ABNORMAL HIGH (ref 0.1–1.0)
Monocytes Relative: 9 %
Neutro Abs: 22.1 10*3/uL — ABNORMAL HIGH (ref 1.7–7.7)
Neutrophils Relative %: 75 %
Platelets: 246 10*3/uL (ref 150–400)
RBC: 4.39 MIL/uL (ref 4.22–5.81)
RDW: 17.6 % — ABNORMAL HIGH (ref 11.5–15.5)
Smear Review: ADEQUATE
WBC: 29.4 10*3/uL — ABNORMAL HIGH (ref 4.0–10.5)
nRBC: 1 /100 WBC — ABNORMAL HIGH
nRBC: 1.1 % — ABNORMAL HIGH (ref 0.0–0.2)

## 2022-02-25 LAB — POCT I-STAT 7, (LYTES, BLD GAS, ICA,H+H)
Acid-Base Excess: 2 mmol/L (ref 0.0–2.0)
Bicarbonate: 27.5 mmol/L (ref 20.0–28.0)
Calcium, Ion: 1.12 mmol/L — ABNORMAL LOW (ref 1.15–1.40)
HCT: 34 % — ABNORMAL LOW (ref 39.0–52.0)
Hemoglobin: 11.6 g/dL — ABNORMAL LOW (ref 13.0–17.0)
O2 Saturation: 100 %
Patient temperature: 103
Potassium: 5.2 mmol/L — ABNORMAL HIGH (ref 3.5–5.1)
Sodium: 137 mmol/L (ref 135–145)
TCO2: 29 mmol/L (ref 22–32)
pCO2 arterial: 52.1 mmHg — ABNORMAL HIGH (ref 32–48)
pH, Arterial: 7.341 — ABNORMAL LOW (ref 7.35–7.45)
pO2, Arterial: 210 mmHg — ABNORMAL HIGH (ref 83–108)

## 2022-02-25 LAB — RENAL FUNCTION PANEL
Albumin: 2.7 g/dL — ABNORMAL LOW (ref 3.5–5.0)
Albumin: 2.4 g/dL — ABNORMAL LOW (ref 3.5–5.0)
Anion gap: 14 (ref 5–15)
Anion gap: 12 (ref 5–15)
BUN: 117 mg/dL — ABNORMAL HIGH (ref 6–20)
BUN: 111 mg/dL — ABNORMAL HIGH (ref 6–20)
CO2: 24 mmol/L (ref 22–32)
CO2: 24 mmol/L (ref 22–32)
Calcium: 8.7 mg/dL — ABNORMAL LOW (ref 8.9–10.3)
Calcium: 8.4 mg/dL — ABNORMAL LOW (ref 8.9–10.3)
Chloride: 101 mmol/L (ref 98–111)
Chloride: 103 mmol/L (ref 98–111)
Creatinine, Ser: 7.49 mg/dL — ABNORMAL HIGH (ref 0.61–1.24)
Creatinine, Ser: 6.72 mg/dL — ABNORMAL HIGH (ref 0.61–1.24)
GFR, Estimated: 9 mL/min — ABNORMAL LOW (ref >60)
GFR, Estimated: 10 mL/min — ABNORMAL LOW (ref >60)
Glucose, Bld: 130 mg/dL — ABNORMAL HIGH (ref 70–99)
Glucose, Bld: 191 mg/dL — ABNORMAL HIGH (ref 70–99)
Phosphorus: 5.4 mg/dL — ABNORMAL HIGH (ref 2.5–4.6)
Phosphorus: 8.5 mg/dL — ABNORMAL HIGH (ref 2.5–4.6)
Potassium: 5.4 mmol/L — ABNORMAL HIGH (ref 3.5–5.1)
Potassium: 5.9 mmol/L — ABNORMAL HIGH (ref 3.5–5.1)
Sodium: 139 mmol/L (ref 135–145)
Sodium: 139 mmol/L (ref 135–145)

## 2022-02-25 LAB — HEPATITIS B SURFACE ANTIGEN: Hepatitis B Surface Ag: NONREACTIVE

## 2022-02-25 LAB — GLUCOSE, CAPILLARY
Glucose-Capillary: 135 mg/dL — ABNORMAL HIGH (ref 70–99)
Glucose-Capillary: 149 mg/dL — ABNORMAL HIGH (ref 70–99)
Glucose-Capillary: 175 mg/dL — ABNORMAL HIGH (ref 70–99)
Glucose-Capillary: 206 mg/dL — ABNORMAL HIGH (ref 70–99)

## 2022-02-25 LAB — HEPATITIS B SURFACE ANTIBODY,QUALITATIVE: Hep B S Ab: NONREACTIVE

## 2022-02-25 LAB — LACTIC ACID, PLASMA
Lactic Acid, Venous: 0.9 mmol/L (ref 0.5–1.9)
Lactic Acid, Venous: 1 mmol/L (ref 0.5–1.9)

## 2022-02-25 LAB — MAGNESIUM: Magnesium: 4 mg/dL — ABNORMAL HIGH (ref 1.7–2.4)

## 2022-02-25 LAB — PROCALCITONIN: Procalcitonin: 2.44 ng/mL

## 2022-02-25 MED ORDER — ACETAMINOPHEN 160 MG/5ML PO SOLN
650.0000 mg | ORAL | Status: DC
  Filled 2022-02-25: qty 20.3

## 2022-02-25 MED ORDER — PIPERACILLIN-TAZOBACTAM 3.375 G IVPB 30 MIN
3.3750 g | Freq: Once | INTRAVENOUS | Status: AC
  Administered 2022-02-25: 3.375 g via INTRAVENOUS
  Filled 2022-02-25 (×2): qty 50

## 2022-02-25 MED ORDER — PRISMASOL BGK 4/2.5 32-4-2.5 MEQ/L EC SOLN
Status: DC
  Filled 2022-02-25 (×10): qty 5000

## 2022-02-25 MED ORDER — HEPARIN SODIUM (PORCINE) 1000 UNIT/ML IJ SOLN
INTRAMUSCULAR | Status: AC
  Filled 2022-02-25: qty 4

## 2022-02-25 MED ORDER — PANTOPRAZOLE SODIUM 40 MG IV SOLR
40.0000 mg | INTRAVENOUS | Status: DC
  Administered 2022-02-25 – 2022-02-26 (×2): 40 mg via INTRAVENOUS
  Filled 2022-02-25 (×2): qty 10

## 2022-02-25 MED ORDER — SODIUM CHLORIDE 0.9 % IV SOLN: INTRAVENOUS | Status: DC | PRN

## 2022-02-25 MED ORDER — PIPERACILLIN-TAZOBACTAM IN DEX 2-0.25 GM/50ML IV SOLN
2.2500 g | Freq: Three times a day (TID) | INTRAVENOUS | Status: DC
Start: 2022-02-25 — End: 2022-02-25

## 2022-02-25 MED ORDER — BUSPIRONE HCL 15 MG PO TABS
30.0000 mg | ORAL_TABLET | Freq: Three times a day (TID) | ORAL | Status: DC
  Filled 2022-02-25: qty 2

## 2022-02-25 MED ORDER — HYDROCORTISONE SOD SUC (PF) 100 MG IJ SOLR
100.0000 mg | Freq: Two times a day (BID) | INTRAMUSCULAR | Status: DC
  Administered 2022-02-25 – 2022-02-27 (×4): 100 mg via INTRAVENOUS
  Filled 2022-02-25 (×5): qty 2

## 2022-02-25 MED ORDER — VASOPRESSIN 20 UNITS/100 ML INFUSION FOR SHOCK
0.0000 [IU]/min | INTRAVENOUS | Status: DC
  Administered 2022-02-25 – 2022-02-27 (×5): 0.03 [IU]/min via INTRAVENOUS
  Filled 2022-02-25 (×5): qty 100

## 2022-02-25 MED ORDER — ACETAMINOPHEN 325 MG PO TABS: 650.0000 mg | ORAL_TABLET | ORAL | Status: DC

## 2022-02-25 MED ORDER — PRISMASOL BGK 4/2.5 32-4-2.5 MEQ/L REPLACEMENT SOLN
Status: DC
  Filled 2022-02-25 (×2): qty 5000

## 2022-02-25 MED ORDER — PRISMASOL BGK 0/2.5 32-2.5 MEQ/L EC SOLN
Status: DC
  Filled 2022-02-25 (×5): qty 5000

## 2022-02-25 MED ORDER — PRISMASOL BGK 0/2.5 32-2.5 MEQ/L EC SOLN
Status: DC
  Filled 2022-02-25 (×21): qty 5000

## 2022-02-25 MED ORDER — HEPARIN SODIUM (PORCINE) 1000 UNIT/ML DIALYSIS
1000.0000 [IU] | INTRAMUSCULAR | Status: DC | PRN
  Filled 2022-02-25: qty 6

## 2022-02-25 MED ORDER — BISACODYL 10 MG RE SUPP
10.0000 mg | Freq: Once | RECTAL | Status: AC
  Administered 2022-02-25: 10 mg via RECTAL
  Filled 2022-02-25: qty 1

## 2022-02-25 MED ORDER — PIPERACILLIN-TAZOBACTAM 3.375 G IVPB 30 MIN
3.3750 g | Freq: Four times a day (QID) | INTRAVENOUS | Status: DC
  Administered 2022-02-25 – 2022-02-27 (×7): 3.375 g via INTRAVENOUS
  Filled 2022-02-25 (×16): qty 50

## 2022-02-25 MED ORDER — ACETAMINOPHEN 650 MG RE SUPP
650.0000 mg | RECTAL | Status: DC
  Administered 2022-02-26: 650 mg via RECTAL
  Filled 2022-02-25 (×2): qty 1

## 2022-02-25 MED ORDER — BUSPIRONE HCL 15 MG PO TABS: 30.0000 mg | ORAL_TABLET | Freq: Three times a day (TID) | ORAL | Status: DC

## 2022-02-25 NOTE — Progress Notes (Signed)
Pharmacy Antibiotic Note ? ?Clinton Taylor is a 36 y.o. male admitted on 01/21/2022 with pneumonia.  Pharmacy has been consulted for Zosyn dosing. Patient transitioned back to CRRT today. ? ?Plan: ?Zosyn 3.375g IV q6h (12min infusion) for CRRT dosing ?Monitor clinical progress, c/s, abx plan/LOT ?F/u Nephrology plans ? ? ? ?Height: 5\' 9"  (175.3 cm) ?Weight: (!) 157 kg (346 lb 2 oz) ?IBW/kg (Calculated) : 70.7 ? ?Temp (24hrs), Avg:102.1 ?F (38.9 ?C), Min:98.7 ?F (37.1 ?C), Max:104 ?F (40 ?C) ? ?Recent Labs  ?Lab 02/19/22 ?0230 02/19/22 ?0518 02/19/22 ?1140 02/21/22 ?0436 02/21/22 ?1642 02/22/22 ?0327 02/22/22 ?1553 02/23/22 ?[id number] 02/23/22 ?1532 02/24/22 ?0507 02/25/22 ?[id number] 02/25/22 ?[id number]  ?WBC 11.0* 11.6*  --  12.1*  --  13.9*  --   --   --   --   --   --   ?CREATININE  --  6.28*   < > 6.07*   < > 5.92* 4.77* 4.42* 3.71* 3.44* 7.49*  --   ?LATICACIDVEN  --   --   --   --   --   --   --   --   --   --   --  0.9  ? < > = values in this interval not displayed.  ?  ?Estimated Creatinine Clearance: 20.1 mL/min (A) (by C-G formula based on SCr of 7.49 mg/dL (H)).   ? ?No Known Allergies ? ?Arturo Morton, PharmD, BCPS ?Please check AMION for all [facility] contact numbers ?Clinical Pharmacist ?02/25/2022 9:06 AM ? ?

## 2022-02-25 NOTE — Progress Notes (Signed)
Patient ID: Clinton Taylor, male   DOB: 09/18/1986, 36 y.o.   MRN: [id number] ?[facility] ?Progress Note  ? ?Assessment/ Plan:   ?1.  Acute kidney injury on chronic kidney disease stage IIIb (labs from last month showed creatinine 2.39): Acute kidney injury likely from ischemic ATN (hemodynamic fluctuations) +/- contrast nephropathy.  Started on CRRT after he developed worsening renal function and multiple electrolyte abnormalities in the setting of worsening respiratory failure.  ?- Given decompensation we will transition back to CRRT  ?- UF goal of keep even as tolerated ?- pressor requirement is decreasing - if starts to go back up would give a NS 500 mL bolus and not remove with CRRT   ? ?2.  Hypernatremia: was medically induced with hypertonic saline to mitigate risk from cerebral edema.  Status post hemicraniectomy and note transient hypertonic saline administered preoperatively.   ? ?3.  Hypertensive emergency with acute left-sided intracerebral hemorrhage: With acute decompensation due to worsening mass effect with left-to-right shift and underwent emergent left hemicraniectomy to relieve intracranial pressure.  Did not undergo evacuation of hematoma based on physical exam intraoperatively.  ?- now with septic shock as below ? ?4. Septic shock  ?- antibiotics per primary team  ?- increasing pressor requirement and cannot rule out contribution of intracranial process  ? ?5.  Acute hypoxic respiratory failure: s/p treatment for COVID-19 infection and community-acquired pneumonia ?- on vent per primary team   ? ?6. Covid PNA ?- therapies per primary team ? ?7. CKD stage 3 ?- last last month with Cr 2.39 per charting ? ?8. Hyperphosphatemia  ?- setting of AKI  ?- resuming CRRT  ? ?8. Microcytic anemia  ?- no acute indication for PRBC's ?- improved and mild  ? ?Disposition - in the ICU on CRRT.  Critically ill. I spoke with his wife at bedside.  Spoke with nursing and critical care.   ? ? ? ?Subjective:   ? ?Head CT with interval increase in edema surround the hematoma.  Note that per charting he had increased levo requirements overnight.  Yesterday PM he had been on minimal requirements on last assessment from pulm and myself.  CRRT was stopped on 5/10 for the CT and as per my order was not restarted.  He had minimal UF over 5/10 with CRRT prior to stopping.  He was anuric over 5/10.  ? ?Levo is at 30 mcg/min (previously at 40 mcg/min overnight) and he is on vaso at 0.03.  His wife and his RN are going to work to see if they can get his ring off now that his swelling is down ? ?Review of systems:   ?Unable to obtain given intubated   ? ?Objective:   ?BP (!) 157/67   Pulse (!) 105   Temp (!) 104 ?F (40 ?C) (Esophageal)   Resp (!) 35   Ht 5\' 9"  (1.753 m)   Wt (!) 157 kg   SpO2 96%   BMI 51.11 kg/m?  ? ?Intake/Output Summary (Last 24 hours) at 02/25/2022 0814 ?Last data filed at 02/25/2022 0700 ?Gross per 24 hour  ?Intake 850.86 ml  ?Output 1450 ml  ?Net -599.14 ml  ? ?Weight change:  ? ?Physical Exam:    ?Gen: Intubated and critically ill    ?HEENT status post left hemicraniectomy ?CVS: S1 and S2. No rub ?Resp: coarse mechanical breath sounds on vent; FIO2 40 and PEEP 8 ?Abd: Soft, obese, nontender ?Ext: no lower extremity edema, trace to 1+ upper ext edema. ?  Neuro - sedation is currently running and does not respond to commands ?Access: RIJ nontunn HD catheter  ? ? ?Imaging: ?DG Abd 1 View ? ?Result Date: 02/24/2022 ?CLINICAL DATA:  Acute respiratory failure.  Aspiration to airway. EXAM: ABDOMEN - 1 VIEW; PORTABLE CHEST - 1 VIEW COMPARISON:  02/23/2022, 02/19/2022. FINDINGS: The heart is enlarged and the mediastinal contour stable. The pulmonary vasculature is distended. Lung volumes are low and mild patchy airspace disease is present at the lung bases. No effusion or pneumothorax. The endotracheal tube terminates 4.5 cm above the carina. A right internal jugular central venous catheter  terminates over the superior vena cava. The bowel gas pattern is normal. Surgical staples are present over the mid abdomen on the left. An enteric tube terminates in the gastric fundus. A feeding tube terminates in the distal stomach. IMPRESSION: 1. Cardiomegaly with pulmonary vascular congestion. 2. Low lung volumes with atelectasis or infiltrate at the lung bases. 3. Support devices as described above. Electronically Signed   By: Brett Fairy M.D.   On: 02/24/2022 03:59  ? ?CT HEAD WO CONTRAST (5MM) ? ?Result Date: 02/24/2022 ?CLINICAL DATA:  Subdural hematoma. Postoperative day 4 status post left hemicraniectomy. EXAM: CT HEAD WITHOUT CONTRAST TECHNIQUE: Contiguous axial images were obtained from the base of the skull through the vertex without intravenous contrast. RADIATION DOSE REDUCTION: This exam was performed according to the departmental dose-optimization program which includes automated exposure control, adjustment of the mA and/or kV according to patient size and/or use of iterative reconstruction technique. COMPARISON:  CT brain 02/21/2022 FINDINGS: The patient is intubated. Partial visualization of nasogastric tube. Brain: Known lobular hyperdense acute to subacute hemorrhage centered within the left putamen and globus pallidus and extending more posteriorly within the centrum semiovale white matter is very similar to prior measuring up to approximately 6.0 x 3.8 x 4.8 cm (transverse by AP by craniocaudal), not significant changed by my measurements on the prior 02/21/2022 CT. There is increased surrounding moderate marrow edema similar around the hemorrhage but more extensively extending into the more anterior left frontal lobe (axial series 3, image 26). There is similar mass effect on the left lateral ventricle. Unchanged 8 mm left-to-right midline shift. The third ventricle is again narrowed. The fourth ventricle and basilar cisterns again appear decompressed. No ventriculomegaly. The right  hemisphere cortical gray-white interface remains intact. Vascular: No hyperdense vessel or unexpected calcification. Skull: Postsurgical changes of left frontotemporal craniectomy. Associated hyperdense anterior greater than posterior frontal and parietal subdural hematoma measuring up to 15 mm in transverse thickness on coronal series 5, image 31, unchanged from 02/21/2022. Mild interval decrease in overlying postoperative scattered extra-axial air at the anterior aspect of the craniectomy. There again lateral scalp skin staples. Sinuses/Orbits: The visualized orbits are unremarkable. Mild right frontal, moderate to high-grade ethmoid air cell, high-grade right and moderate left maxillary, and high-grade sphenoid sinus mucosal opacification, overall increased from prior. Moderate opacification of the bilateral mastoid air cells is mildly worsened from prior. Other: None. IMPRESSION: Compared to 02/21/2022: 1. Redemonstration of left frontotemporal craniectomy. 2. Known acute to subacute left cerebral hemisphere hemorrhage is very similar in size to most recent CT 02/21/2022. Stable 8 mm left-to-right midline shift. 3. Interval increase in edema surrounding the hematoma especially extending into the anterior left frontal white matter. 4. Interval increase in paranasal sinus opacification. Electronically Signed   By: Yvonne Kendall M.D.   On: 02/24/2022 10:13  ? ?DG Chest Port 1 View ? ?Result Date: 02/24/2022 ?CLINICAL DATA:  Acute respiratory failure.  Aspiration to airway. EXAM: ABDOMEN - 1 VIEW; PORTABLE CHEST - 1 VIEW COMPARISON:  02/23/2022, 02/19/2022. FINDINGS: The heart is enlarged and the mediastinal contour stable. The pulmonary vasculature is distended. Lung volumes are low and mild patchy airspace disease is present at the lung bases. No effusion or pneumothorax. The endotracheal tube terminates 4.5 cm above the carina. A right internal jugular central venous catheter terminates over the superior vena  cava. The bowel gas pattern is normal. Surgical staples are present over the mid abdomen on the left. An enteric tube terminates in the gastric fundus. A feeding tube terminates in the distal stomach. IMPRESSION: 1. Cardiomegaly with p
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
## 2022-02-25 NOTE — Progress Notes (Addendum)
Pt Tmax 104 F overnight.  CCM contacted after tylenol & ice packs ineffective.  Cooling blanket unavailable so [employer] applied.  Additional labs ordered.  Also notified that pt has had increasing levophed requirements in order to maintain MAP. ?

## 2022-02-25 NOTE — Progress Notes (Signed)
Wedding ring was removed from finger on left hand due to swelling.  Ring was returned to wife, Clinton Taylor, in room. ?

## 2022-02-25 NOTE — Progress Notes (Signed)
Spoke with patient's wife, she would like to keep patient DNR and continue current care. ? ?DNR orders were written. ? ?  ?Cheri Fowler MD ?[facility] ?See Amion for pager ?If no response to pager, please call [phone] until 7pm ?After 7pm, Please call E-link [phone] ? ?

## 2022-02-25 NOTE — Progress Notes (Signed)
STROKE TEAM PROGRESS NOTE  ? ?INTERVAL HISTORY ?Patient remains intubated on sedation with fentanyl.    Neurological exam remains limited due to sedation.   Renal failure appears to be worsening and his plan would be to restart  CRRT  for acute renal failure.  He remains on 2 pressors for adequate blood pressure.   He has high fever and has been started on Arctic sun for temperature control ?Vitals:  ? 02/25/22 1100 02/25/22 1143 02/25/22 1200 02/25/22 1300  ?BP:      ?Pulse: 74 76 75 77  ?Resp: (!) 29 (!) 29 (!) 33 (!) 30  ?Temp:   (!) 97.1 ?F (36.2 ?C)   ?TempSrc:   Esophageal   ?SpO2: 97% 96% 97% 97%  ?Weight:      ?Height:      ? ?CBC:  ?Recent Labs  ?Lab 02/19/22 ?0388 02/19/22 ?1152 02/22/22 ?0327 02/22/22 ?8280 02/25/22 ?0349 02/25/22 ?0750  ?WBC 11.6*   < > 13.9*  --  29.4*  --   ?NEUTROABS 9.5*  --   --   --  22.1*  --   ?HGB 11.2*   < > 9.3*   < > 10.9* 11.6*  ?HCT 40.7   < > 29.5*   < > 34.6* 34.0*  ?MCV 89.6   < > 79.9*  --  78.8*  --   ?PLT 208   < > 142*  --  246  --   ? < > = values in this interval not displayed.  ? ?Basic Metabolic Panel:  ?Recent Labs  ?Lab 02/24/22 ?0232 02/24/22 ?0406 02/24/22 ?0507 02/24/22 ?1319 02/25/22 ?1791 02/25/22 ?0750  ?NA  --    < > 136   < > 139 137  ?K  --    < > 4.9   < > 5.4* 5.2*  ?CL  --   --  103  --  101  --   ?CO2  --   --  25  --  24  --   ?GLUCOSE  --   --  156*  --  130*  --   ?BUN  --   --  64*  --  117*  --   ?CREATININE  --   --  3.44*  --  7.49*  --   ?CALCIUM  --   --  8.8*  --  8.7*  --   ?MG 3.4*  --   --   --  4.0*  --   ?PHOS  --   --  4.1  --  5.4*  --   ? < > = values in this interval not displayed.  ? ?Lipid Panel:  ?Recent Labs  ?Lab 02/21/22 ?0436  ?TRIG 335*  ? ?HgbA1c:  ?No results for input(s): HGBA1C in the last 168 hours. ? ?Urine Drug Screen:  ?No results for input(s): LABOPIA, COCAINSCRNUR, LABBENZ, AMPHETMU, THCU, LABBARB in the last 168 hours. ?  ?Alcohol Level  ?No results for input(s): ETH in the last 168 hours. ? ? ?IMAGING past 24  hours ?DG CHEST PORT 1 VIEW ? ?Result Date: 02/25/2022 ?CLINICAL DATA:  Respiratory failure, endotracheal intubation, follow-up study EXAM: PORTABLE CHEST 1 VIEW COMPARISON:  Feb 24, 2022 FINDINGS: Again seen is the endotracheal tube with its distal end 2.9 cm above the carina and is satisfactory. Bilateral transjugular central venous catheters, and NG tube are stable as well. Cardiomegaly with pulmonary vascular congestion without significant interval change. Lung volumes are low with patchy airspace disease predominantly  at the lung bases and has minimally improved in the interim. No pleural effusion or pneumothorax. IMPRESSION: 1. Cardiomegaly with pulmonary vascular congestion and congestion has mildly improved in the interim. 2. Low lung volumes with airspace disease/atelectasis at the lung bases and has mildly improved as well. 3.  Support devices as above, stable. Electronically Signed   By: Marjo BickerAmar  Amaresh M.D.   On: 02/25/2022 08:35   ? ?PHYSICAL EXAM ? ?Temp:  [97.1 ?F (36.2 ?C)-104 ?F (40 ?C)] 97.1 ?F (36.2 ?C) (05/11 1200) ?Pulse Rate:  [68-127] 77 (05/11 1300) ?Resp:  [11-35] 30 (05/11 1300) ?SpO2:  [91 %-98 %] 97 % (05/11 1300) ?Arterial Line BP: (94-172)/(35-70) 128/51 (05/11 1300) ?FiO2 (%):  [40 %] 40 % (05/11 1143) ? ?General - overweight, intubated on sedation. ? ?Cardiovascular - Regular rate and rhythm. ? ?Neuro - intubated on sedation.  ?Exam consistent with sedation. Eyes closed.  Unresponsive.  Does not follow commands.  pupil equal size, sluggish to light. Corneal reflex weakly present, gag and cough present. Breathing over the vent.  On pain stimulation, no  movement in all extremities. Toes mute. ? ? ?ASSESSMENT/PLAN ?Mr. Langley GaussBryan Goth is a 36 y.o. male with history of HTN, HLD, obesity, smoking, OSA on CPAP and mood disorder presenting with acute right hemiplegia with right sided numbness.  [facility], was found to have an ICH and was transferred here for further  management.  ICH score is 1.  Head CT last night reveals increasing left to right midline shift to 9mm without hydrocephalus.  3% HTS is off now due to Na 160.  Patient has confirmed covid-19 infection.  His wife reports that he smokes cigarettes and is not always compliant with his medical therapy at home. ?Hemicrani after Repeat HCT 5/6 showed 11mm shift. Did not evacuate hematoma due to clinical deficits. BP difficult to control. ? 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 Neurosurg consulted and take for emergent hemicrani. ?MRI 02/21/2022 : Extensive left craniectomy for decompression. Hemorrhage along the soft tissues of the craniectomy site as seen by CT. No increase in size of intraparenchymal hematoma on the left measuring approximately 6 x 4 x 5 cm. ?Surrounding edema is similar. Mass-effect is similar with left-to-right shift of 8 mm. ?No ventricular trapping. Tiny amount ofblood in the dependent occipital horns. ?2D Echo EF 60-65%, interatrial septum not well visualized ?LDL 110 ?HgbA1c 5.5 ?VTE prophylaxis - SCDs ?No antithrombotic prior to admission, now on No antithrombotic secondary to ICH ?Therapy recommendations:  CIR ?Disposition:  pending ? ?Cerebral Edema (brain compression) ?Cerebral edema present from ICH with 9mm midline shift ?3% HTS at 75 cc/hr -> NS @ 50-> off> ?CT Head 4/30 unchanged  hematoma, midline shift 7 mm ?CT head 5/1 midline shift 5mm ?CT head 5/4 midline shift 6mm ?Na goal 150-155 ?Na 148->151->152->159-> 162->157->154>150>148>148 ?S/p hemi crani 5/6. Hypertonic stopped.  ? ?Hypertensive emergency ?  Home meds:  losartan-HCTZ 100-12.5 daily, amlodipine 10 mg daily ?Off cleviprex drip. ?On max cardene ggt. Coreg held due to bradycardia in 50's.CCM managing BP difficult to control after hemicrani. Added minixodil, doxazosin today. Unable to do cleviprex. Not responding to labetalol pushes. CCM planning to hold steroids.  ?Keep SBP < 160 ?On amlodipine 10 and coreg, hydralazine, cardura, minixidil ?Long-term BP goal normotensive ? ?Worsening AKI with ATN ?Cre 2.59->2.72->2.90->3.37->3.56-> 3.72-> 3.96->6.28->7.10 ?Nephro on board, appreciate recommendations ?Considering ATN ?CRRT on hold for 24 hrs after hemicrani to avoid heparin. ? ?Covid-19 infection with respiratory failure ?Patient is positive for covid-19 infection ?Was on BiPAP -> intubated ?CCM on board ?On decadron IV->solumedrol ?Per report, pt refused remedesivir and paxlovid  ?Ventilator management by CCM ?On rocephine ?Tmax 101.4->afebrile ?Leukocytosis WBC 15.2-12.4->12.9->11.5-> 11.3-> 8.4->11.6 ? ?Hyperlipidemia ?Home meds:  none ?LDL 110, goal < 70 ?Hold off statin now given ICH ?Add statin at discharge ? ?Tobacco abuse ?Current smoker ?Smoking cessation counseling provided ?Nicotine patch provided ?Pt is willing to quit ? ?Other Stroke Risk Factors ?Obesity, Body mass index is 51.11 kg/m?., BMI >/= 30 associated with increased stroke risk, recommend weight loss, diet and exercise as appropriate  ?Obstructive sleep apnea, on CPAP at home ? ?Other Active Problems ?Mood disorder ?Pneumonia: zithromax stopped yesterday, on rocephin. ? ?Hospital day # 12 ? ?Neurological exam continues to be limited due to heavy sedation.  S/p hemicrani for malignant cerebral edema on 02/26/2022.  Continue ongoing management of respiratory failure  as per CCM and renal failure as per nephrology.. D/w Dr Merrily Pew and answered questions Long discussion with patient's wife and multiple family members at the bedside  about his poor prognosis due to worsening ren

## 2022-02-26 DIAGNOSIS — Z7189 Other specified counseling: Secondary | ICD-10-CM

## 2022-02-26 DIAGNOSIS — I615 Nontraumatic intracerebral hemorrhage, intraventricular: Secondary | ICD-10-CM | POA: Diagnosis not present

## 2022-02-26 DIAGNOSIS — I610 Nontraumatic intracerebral hemorrhage in hemisphere, subcortical: Secondary | ICD-10-CM | POA: Diagnosis not present

## 2022-02-26 DIAGNOSIS — N170 Acute kidney failure with tubular necrosis: Secondary | ICD-10-CM | POA: Diagnosis not present

## 2022-02-26 DIAGNOSIS — Z515 Encounter for palliative care: Secondary | ICD-10-CM | POA: Diagnosis not present

## 2022-02-26 LAB — POCT I-STAT 7, (LYTES, BLD GAS, ICA,H+H)
Acid-Base Excess: 3 mmol/L — ABNORMAL HIGH (ref 0.0–2.0)
Bicarbonate: 28.9 mmol/L — ABNORMAL HIGH (ref 20.0–28.0)
Calcium, Ion: 1.16 mmol/L (ref 1.15–1.40)
HCT: 29 % — ABNORMAL LOW (ref 39.0–52.0)
Hemoglobin: 9.9 g/dL — ABNORMAL LOW (ref 13.0–17.0)
O2 Saturation: 99 %
Potassium: 4.9 mmol/L (ref 3.5–5.1)
Sodium: 137 mmol/L (ref 135–145)
TCO2: 30 mmol/L (ref 22–32)
pCO2 arterial: 48.2 mmHg — ABNORMAL HIGH (ref 32–48)
pH, Arterial: 7.386 (ref 7.35–7.45)
pO2, Arterial: 132 mmHg — ABNORMAL HIGH (ref 83–108)

## 2022-02-26 LAB — RENAL FUNCTION PANEL
Albumin: 2.4 g/dL — ABNORMAL LOW (ref 3.5–5.0)
Albumin: 2.3 g/dL — ABNORMAL LOW (ref 3.5–5.0)
Anion gap: 14 (ref 5–15)
Anion gap: 13 (ref 5–15)
BUN: 90 mg/dL — ABNORMAL HIGH (ref 6–20)
BUN: 80 mg/dL — ABNORMAL HIGH (ref 6–20)
CO2: 24 mmol/L (ref 22–32)
CO2: 25 mmol/L (ref 22–32)
Calcium: 8.5 mg/dL — ABNORMAL LOW (ref 8.9–10.3)
Calcium: 8.6 mg/dL — ABNORMAL LOW (ref 8.9–10.3)
Chloride: 99 mmol/L (ref 98–111)
Chloride: 102 mmol/L (ref 98–111)
Creatinine, Ser: 5.5 mg/dL — ABNORMAL HIGH (ref 0.61–1.24)
Creatinine, Ser: 4.76 mg/dL — ABNORMAL HIGH (ref 0.61–1.24)
GFR, Estimated: 13 mL/min — ABNORMAL LOW (ref >60)
GFR, Estimated: 15 mL/min — ABNORMAL LOW (ref >60)
Glucose, Bld: 171 mg/dL — ABNORMAL HIGH (ref 70–99)
Glucose, Bld: 142 mg/dL — ABNORMAL HIGH (ref 70–99)
Phosphorus: 8.2 mg/dL — ABNORMAL HIGH (ref 2.5–4.6)
Phosphorus: 7.5 mg/dL — ABNORMAL HIGH (ref 2.5–4.6)
Potassium: 5.2 mmol/L — ABNORMAL HIGH (ref 3.5–5.1)
Potassium: 4.8 mmol/L (ref 3.5–5.1)
Sodium: 137 mmol/L (ref 135–145)
Sodium: 140 mmol/L (ref 135–145)

## 2022-02-26 LAB — MAGNESIUM: Magnesium: 3.4 mg/dL — ABNORMAL HIGH (ref 1.7–2.4)

## 2022-02-26 LAB — CBC WITH DIFFERENTIAL/PLATELET
Abs Immature Granulocytes: 0 10*3/uL (ref 0.00–0.07)
Basophils Absolute: 0 10*3/uL (ref 0.0–0.1)
Basophils Relative: 0 %
Eosinophils Absolute: 0 10*3/uL (ref 0.0–0.5)
Eosinophils Relative: 0 %
HCT: 30.8 % — ABNORMAL LOW (ref 39.0–52.0)
Hemoglobin: 9.4 g/dL — ABNORMAL LOW (ref 13.0–17.0)
Lymphocytes Relative: 7 %
Lymphs Abs: 2.1 10*3/uL (ref 0.7–4.0)
MCH: 24.8 pg — ABNORMAL LOW (ref 26.0–34.0)
MCHC: 30.5 g/dL (ref 30.0–36.0)
MCV: 81.3 fL (ref 80.0–100.0)
Monocytes Absolute: 2.1 10*3/uL — ABNORMAL HIGH (ref 0.1–1.0)
Monocytes Relative: 7 %
Neutro Abs: 25.5 10*3/uL — ABNORMAL HIGH (ref 1.7–7.7)
Neutrophils Relative %: 86 %
Platelets: UNDETERMINED 10*3/uL (ref 150–400)
RBC: 3.79 MIL/uL — ABNORMAL LOW (ref 4.22–5.81)
RDW: 18 % — ABNORMAL HIGH (ref 11.5–15.5)
WBC: 29.6 10*3/uL — ABNORMAL HIGH (ref 4.0–10.5)
nRBC: 0.1 % (ref 0.0–0.2)

## 2022-02-26 LAB — GLUCOSE, CAPILLARY
Glucose-Capillary: 106 mg/dL — ABNORMAL HIGH (ref 70–99)
Glucose-Capillary: 168 mg/dL — ABNORMAL HIGH (ref 70–99)
Glucose-Capillary: 174 mg/dL — ABNORMAL HIGH (ref 70–99)
Glucose-Capillary: 152 mg/dL — ABNORMAL HIGH (ref 70–99)
Glucose-Capillary: 130 mg/dL — ABNORMAL HIGH (ref 70–99)
Glucose-Capillary: 134 mg/dL — ABNORMAL HIGH (ref 70–99)
Glucose-Capillary: 142 mg/dL — ABNORMAL HIGH (ref 70–99)
Glucose-Capillary: 129 mg/dL — ABNORMAL HIGH (ref 70–99)

## 2022-02-26 LAB — HEPATITIS B SURFACE ANTIBODY, QUANTITATIVE: Hep B S AB Quant (Post): <3.1 m[IU]/mL — ABNORMAL LOW (ref >9.9)

## 2022-02-26 NOTE — Progress Notes (Signed)
Patient ID: Clinton Taylor, male   DOB: [birth year], 36 y.o.   MRN: [id number] ?[facility] ?Progress Note  ? ?Assessment/ Plan:   ?1.  Acute kidney injury on chronic kidney disease stage IIIb (labs from last month showed creatinine 2.39): Acute kidney injury likely from ischemic ATN (hemodynamic fluctuations) +/- contrast nephropathy.  Started on CRRT after he developed worsening renal function and multiple electrolyte abnormalities in the setting of worsening respiratory failure.  ?- Continue CRRT    ?- on 2k bags ?- UF goal of keep even as tolerated   ? ?2.  Hypernatremia: was medically induced with hypertonic saline to mitigate risk from cerebral edema.  Status post hemicraniectomy and note transient hypertonic saline administered preoperatively.   ? ?3.  Hypertensive emergency with acute left-sided intracerebral hemorrhage: With acute decompensation due to worsening mass effect with left-to-right shift and underwent emergent left hemicraniectomy to relieve intracranial pressure.  Did not undergo evacuation of hematoma based on physical exam intraoperatively.  ?- now with septic shock as below ? ?4. Septic shock  ?- antibiotics per primary team  ?- pressors per primary team  ? ?5.  Acute hypoxic respiratory failure: s/p treatment for COVID-19 infection and community-acquired pneumonia ?- on vent per primary team   ? ?6. Covid PNA ?- therapies per primary team ? ?7. CKD stage 3 ?- last last month with Cr 2.39 per charting ? ?8. Hyperphosphatemia  ?- setting of AKI  ?- on CRRT and expected to improve with same  ? ?8. Microcytic anemia  ?- no acute indication for PRBC's ?- mild   ? ?Disposition - in the ICU on CRRT.   ? ? ? ?Subjective:   ? ?He was started on CRRT and bags were changed to 2K per trends.  He had no UOP charted over 5/11. He had 1.1 liters UF over 5/11 with CRRT.  His wife made the patient DNR in the interim.  Spoke with his wife and his mother at bedside.  Additional family is coming.  Down to levo at 21 mcg/min this am per nursing.  ? ?Review of systems:    ?Unable to obtain given intubated   ? ?Objective:   ?BP (!) 141/62   Pulse 81   Temp 98.8 ?F (37.1 ?C) (Esophageal)   Resp 19   Ht 5\' 9"  (1.753 m)   Wt (!) 154.1 kg   SpO2 99%   BMI 50.17 kg/m?  ? ?Intake/Output Summary (Last 24 hours) at 02/26/2022 0923 ?Last data filed at 02/26/2022 0900 ?Gross per 24 hour  ?Intake 1435.72 ml  ?Output 1768 ml  ?Net -332.28 ml  ? ?Weight change:  ? ?Physical Exam:     ?Gen: Intubated and critically ill    ?HEENT status post left hemicraniectomy ?CVS: S1 and S2. No rub ?Resp: coarse mechanical breath sounds on vent ?Abd: Soft, obese, nontender ?Ext: no lower extremity edema, 1+ upper ext edema. ?Neuro - sedation is currently running and does not respond to commands ?Access: RIJ nontunn HD catheter  ? ? ?Imaging: ?CT HEAD WO CONTRAST (5MM) ? ?Result Date: 02/24/2022 ?CLINICAL DATA:  Subdural hematoma. Postoperative day 4 status post left hemicraniectomy. EXAM: CT HEAD WITHOUT CONTRAST TECHNIQUE: Contiguous axial images were obtained from the base of the skull through the vertex without intravenous contrast. RADIATION DOSE REDUCTION: This exam was performed according to the departmental dose-optimization program which includes automated exposure control, adjustment of the mA and/or kV according to patient size and/or use of iterative reconstruction technique. COMPARISON:  CT brain 02/21/2022 FINDINGS: The patient is intubated. Partial visualization of nasogastric tube. Brain: Known lobular hyperdense acute to subacute hemorrhage centered within the left putamen and globus pallidus and extending more posteriorly within the centrum semiovale white matter is very similar to prior measuring up to approximately 6.0 x 3.8 x 4.8 cm (transverse by AP by craniocaudal), not significant changed by my measurements on the prior 02/21/2022 CT. There is increased surrounding moderate marrow edema similar around the  hemorrhage but more extensively extending into the more anterior left frontal lobe (axial series 3, image 26). There is similar mass effect on the left lateral ventricle. Unchanged 8 mm left-to-right midline shift. The third ventricle is again narrowed. The fourth ventricle and basilar cisterns again appear decompressed. No ventriculomegaly. The right hemisphere cortical gray-white interface remains intact. Vascular: No hyperdense vessel or unexpected calcification. Skull: Postsurgical changes of left frontotemporal craniectomy. Associated hyperdense anterior greater than posterior frontal and parietal subdural hematoma measuring up to 15 mm in transverse thickness on coronal series 5, image 31, unchanged from 02/21/2022. Mild interval decrease in overlying postoperative scattered extra-axial air at the anterior aspect of the craniectomy. There again lateral scalp skin staples. Sinuses/Orbits: The visualized orbits are unremarkable. Mild right frontal, moderate to high-grade ethmoid air cell, high-grade right and moderate left maxillary, and high-grade sphenoid sinus mucosal opacification, overall increased from prior. Moderate opacification of the bilateral mastoid air cells is mildly worsened from prior. Other: None. IMPRESSION: Compared to 02/21/2022: 1. Redemonstration of left frontotemporal craniectomy. 2. Known acute to subacute left cerebral hemisphere hemorrhage is very similar in size to most recent CT 02/21/2022. Stable 8 mm left-to-right midline shift. 3. Interval increase in edema surrounding the hematoma especially extending into the anterior left frontal white matter. 4. Interval increase in paranasal sinus opacification. Electronically Signed   By: Yvonne Kendall M.D.   On: 02/24/2022 10:13  ? ?DG CHEST PORT 1 VIEW ? ?Result Date: 02/25/2022 ?CLINICAL DATA:  Respiratory failure, endotracheal intubation, follow-up study EXAM: PORTABLE CHEST 1 VIEW COMPARISON:  Feb 24, 2022 FINDINGS: Again seen is the  endotracheal tube with its distal end 2.9 cm above the carina and is satisfactory. Bilateral transjugular central venous catheters, and NG tube are stable as well. Cardiomegaly with pulmonary vascular congestion without significant interval change. Lung volumes are low with patchy airspace disease predominantly at the lung bases and has minimally improved in the interim. No pleural effusion or pneumothorax. IMPRESSION: 1. Cardiomegaly with pulmonary vascular congestion and congestion has mildly improved in the interim. 2. Low lung volumes with airspace disease/atelectasis at the lung bases and has mildly improved as well. 3.  Support devices as above, stable. Electronically Signed   By: Frazier Richards M.D.   On: 02/25/2022 08:35   ? ?Labs: ?BMET ?Recent Labs  ?Lab 02/22/22 ?1553 02/23/22 ?0022 02/23/22 ?[id number] 02/23/22 ?0804 02/23/22 ?1532 02/23/22 ?1600 02/24/22 ?0406 02/24/22 ?0507 02/24/22 ?1319 02/25/22 ?[id number] 02/25/22 ?0750 02/25/22 ?1536 02/26/22 ?0454  ?NA 140   < > 140   < > 140   < > 135 136 136 139 137 139 137  ?K 3.5   < > 4.1   < > 4.6   < > 5.2* 4.9 4.7 5.4* 5.2* 5.9* 5.2*  ?CL 83*  --  95*  --  101  --   --  103  --  101  --  103 99  ?CO2 42*  --  34*  --  27  --   --  25  --  24  --  24 24  ?GLUCOSE 158*  --  166*  --  137*  --   --  156*  --  130*  --  191* 171*  ?BUN 71*  --  68*  --  63*  --   --  64*  --  117*  --  111* 90*  ?CREATININE 4.77*  --  4.42*  --  3.71*  --   --  3.44*  --  7.49*  --  6.72* 5.50*  ?CALCIUM 7.9*  --  8.6*  --  8.3*  --   --  8.8*  --  8.7*  --  8.4* 8.5*  ?PHOS 6.6*  --  6.7*  --  4.6  --   --  4.1  --  5.4*  --  8.5* 8.2*  ? < > = values in this interval not displayed.  ? ?CBC ?Recent Labs  ?Lab 02/21/22 ?0436 02/21/22 ?[id number] 02/22/22 ?0327 02/22/22 ?0420 02/24/22 ?0406 02/24/22 ?1319 02/25/22 ?[id number] 02/25/22 ?0750  ?WBC 12.1*  --  13.9*  --   --   --  29.4*  --   ?NEUTROABS  --   --   --   --   --   --  22.1*  --   ?HGB 9.8*   < > 9.3*   < > 11.2* 10.9* 10.9* 11.6*  ?HCT  31.0*   < > 29.5*   < > 33.0* 32.0* 34.6* 34.0*  ?MCV 81.8  --  79.9*  --   --   --  78.8*  --   ?PLT 121*  --  142*  --   --   --  246  --   ? < > = values in this interval not displayed.  ? ? ?Medications:   ? ? aceta

## 2022-02-26 NOTE — Consult Note (Addendum)
?Palliative Medicine Inpatient Consult Note ? ?Reason for consult:   ?Clinton Taylor Palliative Medicine Consult  ?Reason for Consult? Family requesting for PMT continued involvement for family and children support.  ? ?HPI:  ?Per intake H&P --> 36 year old male transferred from [facility] to [facility] for left ICH with edema and midline shift and hypertensive emergency. Prior to admission to [facility] he developed right sided weakness while driving. EMS was called and in the ED code stroke was called. He was found with left IPH measuring 6.1 x 4.1x4.6 with mild edema and 50m rightward midline shift, no IV extension. [facility] had [facility] for [facility] and [facility] Wife at bedside reports COVID infection with his co-workers last week. Has had non-productive cough since then. COVID PCR positive. ? ?Palliative care asked to get involved to support additional discussions regarding how to tell patients children about his poor prognosis.  ? ?Clinical Assessment/Goals of Care: ? ?*Please note that this is a verbal dictation therefore any spelling or grammatical errors are due to the "[place]" system interpretation. ? ?I have reviewed medical records including EPIC notes, labs and imaging, received report from bedside RN, assessed the patient.  ?  ?I met with patient spouse, Clinton Taylor further discuss diagnosis prognosis, GOC, EOL wishes, disposition and options. ?  ?I introduced Palliative Medicine as specialized medical care for people living with serious illness. It focuses on providing relief from the symptoms and stress of a serious illness. The goal is to improve quality of life for both the patient and the family. ? ?Medical History Review and Understanding: ? ?We discussed that Clinton Taylor hypertension and was incrementally noncompliant with his blood pressure regiment.  He was also noted to have anxiety for which he "threw out his medications". ? ?Social  History: ? ?[place]  He has been married to his wife, Clinton Connfor the past 14 years.  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  He was active in being at his children's sports and dancing events. ? ?Functional and Nutritional State: ? ?Prior to this event Clinton Edelmanwas fully functional of all B ADLs and IADLs.  He had a robust appetite. ? ?Advance Directives: ? ?Patient's legal surrogate decision maker is his wife Clinton Taylor ? ?Code Status: ? ?Clinton Connexplains that Clinton Edelmanis already a DNAR CODE STATUS. ? ?Discussion: ? ?We recount Clinton Taylor's prolonged hospitalization in the setting of his acute stroke.  Clinton Connshares he was doing well for the most part post stroke having a flaccid right side though otherwise was quite spirited.  She shares that he started having worsening respiratory distress which the medical team thinks is in the setting of COVID though Clinton Taylor not believe this.  He was supported on high flow oxygen and then BiPAP and eventually needed intubation.  We reviewed that slowly his organs have been compromised and shutting down.  Clinton Connshares that Clinton Edelmanhas not put out any urine and he is presently on a device to support his kidneys.  She realizes that these are all bad signs. ? ?Clinton Connexpresses that  her greatest fear is breaking the news of their father's death to their 3 children.  She asked for advice on how to explain this information to each of them.  We reviewed that developmentally the dissemination of such information is different depending upon the age of the child.  I shared that I would further try to collaborate resources as the plan is to bring  the 3 children in tonight at 5:30 PM to have this very difficult conversation. ? ?Clinton Taylor also expresses that Clinton Taylor was the primary breadwinner and that she is not worked.  Her job was to raise and attend to the children.  This is clearly overwhelming to her in regard to what the future may look like in the absence of Clinton Taylor. ? ?Spent a fair amount of time listening to the burden of bearing all of this information.  Offered therapeutic support through reflection.  Clinton Taylor shares that she plans to be strong throughout this whole process and again vocalizes worry for her children.  We talked about how each of them deal with grief differently their daughter tends to be quiet, their middle son tends to get angry and be violent, and their youngest son tends to be quite emotional.  We reviewed the importance of keeping them focused on activities which Clinton Taylor shares will be difficult as summer is encroaching.  She expresses that her family lives here in [place] family lives in [place] though [place] family plans to be here as a support until early June. ? ?Clinton Taylor's friend came to bedside as we are commencing our conversation.  Clinton Taylor got tearful in the setting of the present grief and stress.  Friend was able to offer support to her during this time. ? ?Resources on kids Path provided. ? ?Discussed the importance of continued conversation with family and their  medical providers regarding overall plan of care and treatment options, ensuring decisions are within the context of the patients values and GOCs. ? ?Decision Maker: ?Clinton Taylor (spouse): [phone] ? ?SUMMARY OF RECOMMENDATIONS   ?DNAR ? ?Emotional and therapeutic support provided ? ?Have reached out to kids Path programs to see if they can offer patient's wife, Clinton Taylor support on breaking bad news to children ? ?Have reviewed the importance of Clinton Taylor children continuing to have psychological support after he passes ? ?Plan for family meeting tonight  at 5:30 PM to inform children that Kaidyn will not be coming home --> Unfortunately, at this time there is no child psychologist or chaplain who specializes in child grief present for this meeting ? ?Likely shift to comfort care on Monday ? ?Ongoing palliative care support ? ?Code Status/Advance Care Planning: ?DNAR ? ?Palliative Prophylaxis:  ?Aspiration, Bowel Regimen, Delirium Protocol, Frequent Pain Assessment, Oral Care, Palliative Wound Care, and Turn Reposition ? ?Additional Recommendations (Limitations, Scope, Preferences): ?Continue current care ? ?Psycho-social/Spiritual:  ?Desire for further Chaplaincy support: Yes. ?Additional Recommendations: Discussion of developmentally appropriate information dissemination. ?  ?Prognosis: Poor, likely once all interventions are stopped prognosis will be hours to days. ? ?Discharge Planning: Discharge will be celestial. ? ?Vitals:  ? 02/26/22 1130 02/26/22 1145  ?BP: 131/69 129/67  ?Pulse: 76 74  ?Resp: (!) 33 (!) 29  ?Temp: 98.4 ?F (36.9 ?C) 98.4 ?F (36.9 ?C)  ?SpO2: 96% 98%  ? ? ?Intake/Output Summary (Last 24 hours) at 02/26/2022 1208 ?Last data filed at 02/26/2022 1200 ?Gross per 24 hour  ?Intake 1430.04 ml  ?Output 2102 ml  ?Net -671.96 ml  ? ?Last Weight  Most recent update:  02/26/2022  5:47 AM  ? ? Weight  ?154.1 kg (339 lb 11.7 oz)    ?      ? ?  ? ?Gen: Young Caucasian male in no acute distress intubated and sedated ?HEENT: ETT, cortrak, dry mucous membranes ?CV: Regular rate and rhythm ?PULM: On mechanical ventilator ?ABD: soft/nontender ?EXT: Generalized edema ?Neuro: On sedation ? ?PPS: 10% ? ? ?This conversation/these recommendations were discussed with patient primary care team, Dr. Tacy Learn ? ?Total Time: 126 ? ?Billing based on MDM: High ? ?Problems Addressed: One acute or chronic illness or injury that poses a threat to life or bodily function ? ?Amount and/or Complexity of Data: Category 3:Discussion of management or test interpretation with external  physician/other qualified health care professional/appropriate source (not separately reported) ? ?Risks: Decision not to resuscitate or to de-escalate care because of poor prognosis ?_______________________

## 2022-02-26 NOTE — Progress Notes (Signed)
? ?  Palliative Medicine Inpatient Follow Up Note ? ? ?Palliative care has acknowledged the consultation for [facility]. ? ?I messaged ICU attending Dr. Merrily Pew this morning via secure chat. He shared that he spoke with patients spouse last night. Patient will remain DNAR with the plans for possible transition to comfort Monday if no improvements are made. ? ?At this time the goals are clear. ? ?Palliative care will remove this consult per discussion with DR. Chand though we can be reached at anytime if additional support is needed. ? ?No Charge ?______________________________________________________________________________________ ?Clinton Taylor ?[place] Palliative Medicine Team ?Team Cell Phone: [phone] ?Please utilize secure chat with additional questions, if there is no response within 30 minutes please call the above phone number ? ?Palliative Medicine Team providers are available by phone from 7am to 7pm daily and can be reached through the team cell phone.  ?Should this patient require assistance outside of these hours, please call the patient's attending physician. ? ? ? ? ?

## 2022-02-26 NOTE — Progress Notes (Signed)
STROKE TEAM PROGRESS NOTE  ? ?INTERVAL HISTORY ?Patient remains intubated on sedation with fentanyl.    Neurological exam remains limited due to sedation.   Temperature is controlled now but white count remains very high.  Renal function slightly improved on CRRT.  Patient's family has agreed to DNR and thinking about terminal extubation likely early next week ?Vitals:  ? 02/26/22 1235 02/26/22 1240 02/26/22 1245 02/26/22 1300  ?BP:   116/63 (!) 115/54  ?Pulse: 74 73 72 71  ?Resp: (!) 27 (!) 29 (!) 30 (!) 29  ?Temp:    98.6 ?F (37 ?C)  ?TempSrc:    Esophageal  ?SpO2: 98% 97% 97% 95%  ?Weight:      ?Height:      ? ?CBC:  ?Recent Labs  ?Lab 02/25/22 ?0619 02/25/22 ?0750 02/26/22 ?1024 02/26/22 ?1025  ?WBC 29.4*  --  29.6*  --   ?NEUTROABS 22.1*  --  25.5*  --   ?HGB 10.9*   < > 9.4* 9.9*  ?HCT 34.6*   < > 30.8* 29.0*  ?MCV 78.8*  --  81.3  --   ?PLT 246  --  PLATELET CLUMPS NOTED ON SMEAR, UNABLE TO ESTIMATE  --   ? < > = values in this interval not displayed.  ? ?Basic Metabolic Panel:  ?Recent Labs  ?Lab 02/25/22 ?0619 02/25/22 ?0750 02/25/22 ?1536 02/26/22 ?0454 02/26/22 ?1025  ?NA 139   < > 139 137 137  ?K 5.4*   < > 5.9* 5.2* 4.9  ?CL 101  --  103 99  --   ?CO2 24  --  24 24  --   ?GLUCOSE 130*  --  191* 171*  --   ?BUN 117*  --  111* 90*  --   ?CREATININE 7.49*  --  6.72* 5.50*  --   ?CALCIUM 8.7*  --  8.4* 8.5*  --   ?MG 4.0*  --   --  3.4*  --   ?PHOS 5.4*  --  8.5* 8.2*  --   ? < > = values in this interval not displayed.  ? ?Lipid Panel:  ?Recent Labs  ?Lab 02/21/22 ?0436  ?TRIG 335*  ? ?HgbA1c:  ?No results for input(s): HGBA1C in the last 168 hours. ? ?Urine Drug Screen:  ?No results for input(s): LABOPIA, COCAINSCRNUR, LABBENZ, AMPHETMU, THCU, LABBARB in the last 168 hours. ?  ?Alcohol Level  ?No results for input(s): ETH in the last 168 hours. ? ? ?IMAGING past 24 hours ?No results found. ? ?PHYSICAL EXAM ? ?Temp:  [97.2 ?F (36.2 ?C)-98.8 ?F (37.1 ?C)] 98.6 ?F (37 ?C) (05/12 1300) ?Pulse Rate:  [67-81]  71 (05/12 1300) ?Resp:  [17-36] 29 (05/12 1300) ?BP: (107-149)/(50-91) 115/54 (05/12 1300) ?SpO2:  [92 %-99 %] 95 % (05/12 1300) ?Arterial Line BP: (89-164)/(34-73) 127/51 (05/12 1300) ?FiO2 (%):  [40 %] 40 % (05/12 0849) ?Weight:  [154.1 kg] 154.1 kg (05/12 0500) ? ?General - overweight, intubated on sedation. ? ?Cardiovascular - Regular rate and rhythm. ? ?Neuro - intubated on sedation.  ?Exam consistent with sedation. Eyes closed.  Unresponsive.  Does not follow commands.  pupil equal size, sluggish to light. Corneal reflex weakly present, gag and cough present. Breathing over the vent.  On pain stimulation, no  movement in all extremities. Toes mute. ? ? ?ASSESSMENT/PLAN ?[name] is a 36 y.o. male with history of HTN, HLD, obesity, smoking, OSA on CPAP and mood disorder presenting with acute right hemiplegia with right sided  numbness.  He was taken to [facility], was found to have an ICH and was transferred here for further management.  ICH score is 1.  Head CT last night reveals increasing left to right midline shift to 67mm without hydrocephalus.  3% HTS is off now due to Na 160.  Patient has confirmed covid-19 infection.  His wife reports that he smokes cigarettes and is not always compliant with his medical therapy at home. ?Hemicrani after Repeat HCT 5/6 showed 69mm shift. Did not evacuate hematoma due to clinical deficits. BP difficult to control. ? 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 Neurosurg consulted and take for emergent hemicrani. ?MRI 02/21/2022 : Extensive left craniectomy for decompression. Hemorrhage along the soft tissues of the craniectomy site as seen by CT. No increase in size of intraparenchymal hematoma on the left measuring approximately 6 x 4 x 5 cm. ?Surrounding edema is similar. Mass-effect is similar with left-to-right shift of 8 mm. ?No ventricular trapping. Tiny amount ofblood in the dependent occipital horns. ?2D Echo EF 60-65%, interatrial septum not well visualized ?LDL 110 ?HgbA1c 5.5 ?VTE prophylaxis - SCDs ?No antithrombotic prior to admission, now on No antithrombotic secondary to ICH ?Therapy recommendations:  CIR ?Disposition:  pending ? ?Cerebral Edema (brain compression) ?Cerebral edema present from ICH with 73mm midline shift ?3% HTS at 75 cc/hr -> NS @ 50-> off> ?CT Head 4/30 unchanged hematoma, midline shift 7 mm ?CT head 5/1 midline shift 53mm ?CT head 5/4 midline shift 24mm ?Na goal 150-155 ?Na 148->151->152->159-> 162->157->154>150>148>148 ?S/p hemi crani 5/6. Hypertonic stopped.  ? ?Hypertensive emergency ?Home meds:  losartan-HCTZ 100-12.5 daily, amlodipine 10 mg daily ?Off cleviprex drip. ?On max cardene ggt. Coreg held due to bradycardia in 50's.CCM managing BP difficult to control after hemicrani. Added minixodil, doxazosin today. Unable to do cleviprex. Not responding to labetalol pushes. CCM planning to hold steroids.  ?Keep SBP < 160 ?On amlodipine 10 and coreg, hydralazine, cardura, minixidil ?Long-term BP goal normotensive ? ?Worsening AKI with ATN ?Cre 2.59->2.72->2.90->3.37->3.56-> 3.72-> 3.96->6.28->7.10 ?Nephro on board, appreciate recommendations ?Considering ATN ?CRRT on hold for 24 hrs after hemicrani to avoid heparin. ? ?Covid-19 infection with respiratory failure ?Patient is positive for covid-19 infection ?Was on BiPAP -> intubated ?CCM on  board ?On decadron IV->solumedrol ?Per report, pt refused remedesivir and paxlovid  ?Ventilator management by CCM ?On rocephine ?Tmax 101.4->afebrile ?Leukocytosis WBC 15.2-12.4->12.9->11.5-> 11.3-> 8.4->11.6 ? ?Hyperlipidemia ?Home meds:  none ?LDL 110, goal < 70 ?Hold off statin now given ICH ?Add statin at discharge ? ?Tobacco abuse ?Current smoker ?Smoking cessation counseling provided ?Nicotine patch provided ?Pt is willing to quit ? ?Other Stroke Risk Factors ?Obesity, Body mass index is 50.17 kg/m?., BMI >/= 30 associated with increased stroke risk, recommend weight loss, diet and exercise as appropriate  ?Obstructive sleep apnea, on CPAP at home ? ?Other Active Problems ?Mood disorder ?Pneumonia: zithromax stopped yesterday, on rocephin. ? ?Hospital  day # 13 ? ?Neurological exam continues to be limited due to heavy sedation.  S/p hemicrani for malignant cerebral edema on 02/24/2022.  Continue ongoing management of respiratory failure as per CCM and renal failure as per nephrology.. 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  Family has decided on DNR and will likely transition to comfort care on Monday.  Stroke team will sign off.  Kindly call for questions.   Marland Kitchen. ?This patient is critically ill due to left BG large ICH, cerebral edema, COVID infection, respiratory failure, renal failure and at significant risk of neurological worsening, death form hematoma expansion, brain herniation, seizure, complication from COVID and respiratory failure. This patient's care requires constant monitoring of vital signs, hemodynamics, respiratory and cardiac monitoring, review of multiple databases,  neurological assessment, discussion with family, other specialists and medical decision making of high complexity. I spent 30 minutes of neurocritical care time in the care of this patient.   .  ? ?Pra

## 2022-02-26 NOTE — Plan of Care (Signed)
?  Problem: Nutrition: ?Goal: Dietary intake will improve ?Outcome: Progressing ?  ?Problem: Clinical Measurements: ?Goal: Ability to maintain clinical measurements within normal limits will improve ?Outcome: Progressing ?  ?Problem: Nutrition: ?Goal: Adequate nutrition will be maintained ?Outcome: Progressing ?  ?Problem: Coping: ?Goal: Will verbalize positive feelings about self ?Outcome: Not Progressing ?Goal: Will identify appropriate support needs ?Outcome: Not Progressing ?  ?Problem: Self-Care: ?Goal: Ability to participate in self-care as condition permits will improve ?Outcome: Not Progressing ?  ?Problem: Activity: ?Goal: Risk for activity intolerance will decrease ?Outcome: Not Progressing ?  ?

## 2022-02-26 NOTE — Progress Notes (Addendum)
Discussed restarting tube feeds with MD Chand with CCM. Plan is to restart with trickle feeds at 17ml/hr and monitor for episodes of emesis. Verbal order received from MD Chand to continue holing per tube meds this morning.  ?

## 2022-02-26 NOTE — Progress Notes (Signed)
This chaplain is present with the PMT in the coordinating of grief care for the Pt. family. The chaplain updated the Pt. RN-Kelly, Pt. Wife-Heather, PMT NP-Michele, and the On-call chaplain-Frederick.  ? ?The chaplain understands the Pt. wife is in agreement of grief care for the Pt. three adolescent children during tonight's family meeting at 5:30 pm and into the future. This chaplain is grateful for the resources from [facility] in supporting the Pt. Wife and children. ? ?This chaplain is available for F/U spiritual care as needed. ? ?Chaplain Stephanie Acre ?[phone] ?  ? ?

## 2022-02-26 NOTE — Progress Notes (Signed)
E-Link made aware of slight mottling on big toe of R foot. Capillary refill less than 3 seconds at last assessment. Will continue to monitor. ?

## 2022-02-26 NOTE — Progress Notes (Signed)
This chaplain responded to PMT referral for sharing grief care and resources with the Pt. wife for their three adolescent children. The chaplain was updated by the Pt. RN-Kelly before the visit.  The Pt. wife-Clinton Taylor and Pt. mother-Clinton Taylor are at the bedside and agree to spiritual care's support in the 4N waiting area. ? ?The chaplain listened reflectively as the family shared the Pt. story as a father and son. The chaplain understands the Pt. And Clinton Taylor are religious and Clinton Taylor is not religious. Clinton Taylor shares she predicts the children's first reaction to seeing their father at today's meeting will be curiosity and anger. The chaplain understands Clinton Taylor is supportive of grief care for the children.  Clinton Taylor shares she is more self-reflective and quiet, with the hope of being strong for the children. ? ?The chaplain through reflective listening recognizes the family is requesting a medical provider and an age appropriate grief care specialist for the children. The chaplain understands the PMT will re-enter patient care coordination of family support.  This chaplain will F/U with the family before the end of the day. ? ?Chaplain Stephanie Acre ?[phone] ?

## 2022-02-26 NOTE — Progress Notes (Signed)
Subjective: ?Patient reports that he continues to be unresponsive, sedated, and on vent. Patient has been made a DNR with continuation of current medical care.  ? ?Objective: ?Vital signs in last 24 hours: ?Temp:  [97.1 ?F (36.2 ?C)-99 ?F (37.2 ?C)] 98.6 ?F (37 ?C) (05/12 0715) ?Pulse Rate:  [67-105] 73 (05/12 0715) ?Resp:  [13-36] 36 (05/12 0715) ?BP: (107-149)/(50-91) 134/68 (05/12 0715) ?SpO2:  [92 %-98 %] 97 % (05/12 0715) ?Arterial Line BP: (89-164)/(34-73) 138/54 (05/12 0715) ?FiO2 (%):  [40 %] 40 % (05/12 0334) ?Weight:  [154.1 kg] 154.1 kg (05/12 0500) ? ?Intake/Output from previous day: ?05/11 0701 - 05/12 0700 ?In: 1433.2 [I.V.:1227.1; IV Piggyback:200.1] ?Out: 1634 [Emesis/NG output:500] ?Intake/Output this shift: ?No intake/output data recorded. ? ?Physical Exam: Patient is intubated and sedated on Fentanyl. He is unresponsive. Minimal eye opening bilaterally to noxious stimuli. PERRL. 2-3 mm and sluggish. Dysconjugate gaze. Corneal reflexes diminished on the right, intact on the left. No spontaneous movement.  No withdrawal in his left upper or lower extremity to noxious stimuli.  He had a flicker in his right foot to noxious stimuli.  No other movement with central or peripheral stimulation. Left craniectomy flap full but soft. Incisions are CDI.  ? ? ?Lab Results: ?Recent Labs  ?  02/25/22 ?0619 02/25/22 ?0750  ?WBC 29.4*  --   ?HGB 10.9* 11.6*  ?HCT 34.6* 34.0*  ?PLT 246  --   ? ?BMET ?Recent Labs  ?  02/25/22 ?1536 02/26/22 ?0454  ?NA 139 137  ?K 5.9* 5.2*  ?CL 103 99  ?CO2 24 24  ?GLUCOSE 191* 171*  ?BUN 111* 90*  ?CREATININE 6.72* 5.50*  ?CALCIUM 8.4* 8.5*  ? ? ?Studies/Results: ?CT HEAD WO CONTRAST (5MM) ? ?Result Date: 02/24/2022 ?CLINICAL DATA:  Subdural hematoma. Postoperative day 4 status post left hemicraniectomy. EXAM: CT HEAD WITHOUT CONTRAST TECHNIQUE: Contiguous axial images were obtained from the base of the skull through the vertex without intravenous contrast. RADIATION DOSE REDUCTION:  This exam was performed according to the departmental dose-optimization program which includes automated exposure control, adjustment of the mA and/or kV according to patient size and/or use of iterative reconstruction technique. COMPARISON:  CT brain 02/21/2022 FINDINGS: The patient is intubated. Partial visualization of nasogastric tube. Brain: Known lobular hyperdense acute to subacute hemorrhage centered within the left putamen and globus pallidus and extending more posteriorly within the centrum semiovale white matter is very similar to prior measuring up to approximately 6.0 x 3.8 x 4.8 cm (transverse by AP by craniocaudal), not significant changed by my measurements on the prior 02/21/2022 CT. There is increased surrounding moderate marrow edema similar around the hemorrhage but more extensively extending into the more anterior left frontal lobe (axial series 3, image 26). There is similar mass effect on the left lateral ventricle. Unchanged 8 mm left-to-right midline shift. The third ventricle is again narrowed. The fourth ventricle and basilar cisterns again appear decompressed. No ventriculomegaly. The right hemisphere cortical gray-white interface remains intact. Vascular: No hyperdense vessel or unexpected calcification. Skull: Postsurgical changes of left frontotemporal craniectomy. Associated hyperdense anterior greater than posterior frontal and parietal subdural hematoma measuring up to 15 mm in transverse thickness on coronal series 5, image 31, unchanged from 02/21/2022. Mild interval decrease in overlying postoperative scattered extra-axial air at the anterior aspect of the craniectomy. There again lateral scalp skin staples. Sinuses/Orbits: The visualized orbits are unremarkable. Mild right frontal, moderate to high-grade ethmoid air cell, high-grade right and moderate left maxillary, and high-grade sphenoid sinus mucosal  opacification, overall increased from prior. Moderate opacification of the  bilateral mastoid air cells is mildly worsened from prior. Other: None. IMPRESSION: Compared to 02/21/2022: 1. Redemonstration of left frontotemporal craniectomy. 2. Known acute to subacute left cerebral hemisphere hemorrhage is very similar in size to most recent CT 02/21/2022. Stable 8 mm left-to-right midline shift. 3. Interval increase in edema surrounding the hematoma especially extending into the anterior left frontal white matter. 4. Interval increase in paranasal sinus opacification. Electronically Signed   By: Yvonne Kendall M.D.   On: 02/24/2022 10:13  ? ?DG CHEST PORT 1 VIEW ? ?Result Date: 02/25/2022 ?CLINICAL DATA:  Respiratory failure, endotracheal intubation, follow-up study EXAM: PORTABLE CHEST 1 VIEW COMPARISON:  Feb 24, 2022 FINDINGS: Again seen is the endotracheal tube with its distal end 2.9 cm above the carina and is satisfactory. Bilateral transjugular central venous catheters, and NG tube are stable as well. Cardiomegaly with pulmonary vascular congestion without significant interval change. Lung volumes are low with patchy airspace disease predominantly at the lung bases and has minimally improved in the interim. No pleural effusion or pneumothorax. IMPRESSION: 1. Cardiomegaly with pulmonary vascular congestion and congestion has mildly improved in the interim. 2. Low lung volumes with airspace disease/atelectasis at the lung bases and has mildly improved as well. 3.  Support devices as above, stable. Electronically Signed   By: Frazier Richards M.D.   On: 02/25/2022 08:35  ? ?ECHOCARDIOGRAM COMPLETE ? ?Result Date: 02/24/2022 ?   ECHOCARDIOGRAM REPORT   Patient Name:   Clinton Taylor Date of Exam: 02/24/2022 Medical Rec #:  [id number]        Height:       69.0 in Accession #:    [id number]       Weight:       346.1 lb Date of Birth:  21-Mar-1986       BSA:          2.608 m? Patient Age:    73 years         BP:           156/54 mmHg Patient Gender: M                HR:           55 bpm. Exam  Location:  Inpatient Procedure: 2D Echo, Color Doppler, Limited Color Doppler and Intracardiac            Opacification Agent Indications:    Cardiomyopathy-Ischemic  History:        Patient has prior history of Echocardiogram examinations, most                 recent 02/14/2022. Risk Factors:Hypertension.  Sonographer:    Joette Catching RCS Referring Phys: [phone] Frederik Pear  Sonographer Comments: Patient is morbidly obese, echo performed with patient supine and on artificial respirator and suboptimal parasternal window. IMPRESSIONS  1. Left ventricular ejection fraction, by estimation, is 65 to 70%. The left ventricle has normal function. The left ventricle has no regional wall motion abnormalities. There is moderate concentric left ventricular hypertrophy. Left ventricular diastolic parameters are consistent with Grade I diastolic dysfunction (impaired relaxation). The E/e' is 9.  2. Right ventricular systolic function is normal. The right ventricular size is normal.  3. The mitral valve is normal in structure. No evidence of mitral valve regurgitation.  4. The aortic valve is tricuspid. Aortic valve regurgitation is not visualized. No aortic stenosis is present.  5. The inferior vena cava  is normal in size with greater than 50% respiratory variability, suggesting right atrial pressure of 3 mmHg. Comparison(s): Compared to prior TTE on 02/14/22, there is no significant change. FINDINGS  Left Ventricle: Left ventricular ejection fraction, by estimation, is 65 to 70%. The left ventricle has normal function. The left ventricle has no regional wall motion abnormalities. Definity contrast agent was given IV to delineate the left ventricular  endocardial borders. The left ventricular internal cavity size was normal in size. There is moderate concentric left ventricular hypertrophy. Left ventricular diastolic parameters are consistent with Grade I diastolic dysfunction (impaired relaxation). Normal left ventricular  filling pressure. The E/e' is 9. Right Ventricle: The right ventricular size is normal. Right vetricular wall thickness was not well visualized. Right ventricular systolic function is normal. Left Atrium:
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
## 2022-02-27 ENCOUNTER — Other Ambulatory Visit: Payer: Self-pay | Admitting: Emergency Medicine

## 2022-02-27 DIAGNOSIS — N170 Acute kidney failure with tubular necrosis: Secondary | ICD-10-CM | POA: Diagnosis not present

## 2022-02-27 DIAGNOSIS — Z515 Encounter for palliative care: Secondary | ICD-10-CM | POA: Diagnosis not present

## 2022-02-27 DIAGNOSIS — Z7189 Other specified counseling: Secondary | ICD-10-CM | POA: Diagnosis not present

## 2022-02-27 DIAGNOSIS — I610 Nontraumatic intracerebral hemorrhage in hemisphere, subcortical: Secondary | ICD-10-CM | POA: Diagnosis not present

## 2022-02-27 DIAGNOSIS — I615 Nontraumatic intracerebral hemorrhage, intraventricular: Secondary | ICD-10-CM | POA: Diagnosis not present

## 2022-02-27 DIAGNOSIS — I10 Essential (primary) hypertension: Secondary | ICD-10-CM

## 2022-02-27 LAB — RENAL FUNCTION PANEL
Albumin: 2.3 g/dL — ABNORMAL LOW (ref 3.5–5.0)
Anion gap: 13 (ref 5–15)
BUN: 72 mg/dL — ABNORMAL HIGH (ref 6–20)
CO2: 25 mmol/L (ref 22–32)
Calcium: 8.5 mg/dL — ABNORMAL LOW (ref 8.9–10.3)
Chloride: 99 mmol/L (ref 98–111)
Creatinine, Ser: 4.49 mg/dL — ABNORMAL HIGH (ref 0.61–1.24)
GFR, Estimated: 16 mL/min — ABNORMAL LOW (ref >60)
Glucose, Bld: 145 mg/dL — ABNORMAL HIGH (ref 70–99)
Phosphorus: 8.2 mg/dL — ABNORMAL HIGH (ref 2.5–4.6)
Potassium: 4.7 mmol/L (ref 3.5–5.1)
Sodium: 137 mmol/L (ref 135–145)

## 2022-02-27 LAB — CULTURE, RESPIRATORY W GRAM STAIN: Culture: NORMAL

## 2022-02-27 LAB — GLUCOSE, CAPILLARY: Glucose-Capillary: 151 mg/dL — ABNORMAL HIGH (ref 70–99)

## 2022-02-27 LAB — MAGNESIUM: Magnesium: 3.4 mg/dL — ABNORMAL HIGH (ref 1.7–2.4)

## 2022-02-27 MED ORDER — GLYCOPYRROLATE 0.2 MG/ML IJ SOLN
0.4000 mg | INTRAMUSCULAR | Status: DC
  Administered 2022-02-27: 0.4 mg via INTRAVENOUS
  Filled 2022-02-27 (×3): qty 2

## 2022-02-27 MED ORDER — GLYCOPYRROLATE 0.2 MG/ML IJ SOLN
0.4000 mg | INTRAMUSCULAR | Status: DC | PRN
  Administered 2022-02-27: 0.4 mg via INTRAVENOUS

## 2022-02-27 MED ORDER — MIDAZOLAM-SODIUM CHLORIDE 100-0.9 MG/100ML-% IV SOLN
0.5000 mg/h | INTRAVENOUS | Status: DC
  Administered 2022-02-27: 5 mg/h via INTRAVENOUS
  Filled 2022-02-27: qty 100

## 2022-02-27 MED ORDER — MIDAZOLAM BOLUS VIA INFUSION
1.0000 mg | INTRAVENOUS | Status: DC | PRN
  Administered 2022-02-27 (×2): 1 mg via INTRAVENOUS
  Filled 2022-02-27: qty 1

## 2022-02-27 MED ORDER — BISACODYL 10 MG RE SUPP: 10.0000 mg | Freq: Every day | RECTAL | Status: DC | PRN

## 2022-03-18 NOTE — Progress Notes (Signed)
Patient ID: Clinton Taylor, male   DOB: Jul 24, 1986, 36 y.o.   MRN: [id number] ?[facility] ?Progress Note  ? ?Assessment/ Plan:   ?1.  Acute kidney injury on chronic kidney disease stage IIIb (labs from last month showed creatinine 2.39): Acute kidney injury likely from ischemic ATN (hemodynamic fluctuations) +/- contrast nephropathy.  Started on CRRT after he developed worsening renal function and multiple electrolyte abnormalities in the setting of worsening respiratory failure.  ?- stopping CRRT as transition to comfort measures planned   ? ?2.  Hypernatremia: was medically induced with hypertonic saline to mitigate risk from cerebral edema.  Status post hemicraniectomy and note transient hypertonic saline administered preoperatively.   ? ?3.  Hypertensive emergency with acute left-sided intracerebral hemorrhage: With acute decompensation due to worsening mass effect with left-to-right shift and underwent emergent left hemicraniectomy to relieve intracranial pressure.  Did not undergo evacuation of hematoma based on physical exam intraoperatively.  ?- now with septic shock as below ? ?4. Septic shock  ?- antibiotics per primary team  ?- pressors per primary team  ? ?5.  Acute hypoxic respiratory failure: s/p treatment for COVID-19 infection and community-acquired pneumonia ?- on vent per primary team  and palliative extubation planned for 9 am per nursing report.  They would like to get extra equipment out of the room now to allow room for family.   ? ?6. Covid PNA ?- therapies per primary team ? ?7. CKD stage 3 ?- last last month with Cr 2.39 per charting ? ? ?Disposition -for transition to full comfort and extubation at 9am.  Please notify me if I can be of any assistance  ? ? ? ?Subjective:   ? ?Plan is for compassionate extubation at 9 am and full comfort.  He was kept on CRRT overnight.  We are going ahead and taking this out of the room to allow for more family to be with him. He is on levo  and vasopressin.  ? ?Review of systems:    ?Unable to obtain given intubated   ? ?Objective:   ?BP 123/67   Pulse 68   Temp 98.5 ?F (36.9 ?C)   Resp (!) 32   Ht _0  (1.753 m)   Wt (!) 154.1 kg   SpO2 95%   BMI 50.17 kg/m?  ? ?Intake/Output Summary (Last 24 hours) at 03-09-2022 0738 ?Last data filed at 03/09/22 0700 ?Gross per 24 hour  ?Intake 1448.51 ml  ?Output 1659 ml  ?Net -210.49 ml  ? ?Weight change:  ? ?Physical Exam: examined through window given covid 19 status to conserve ppe and reduce staff exposures. Charting reviewed. And spoke with RN.  ?Gen: Intubated and critically ill    ?HEENT status post left hemicraniectomy ?CVS: RRR per monitor ?Resp: on vent support ?Abd: obese habitus  ?Ext: no lower extremity edema, 1+ upper ext edema. ?Neuro - sedation is currently running  ?Access: RIJ nontunn HD catheter  ? ? ?Imaging: ?No results found. ? ?Labs: ?BMET ?Recent Labs  ?Lab 02/23/22 ?1532 02/23/22 ?1600 02/24/22 ?0507 02/24/22 ?1319 02/25/22 ?2725 02/25/22 ?0750 02/25/22 ?1536 02/26/22 ?0454 02/26/22 ?1025 02/26/22 ?1605 03/09/22 ?0505  ?NA 140   < > 136   < > 139 137 139 137 137 140 137  ?K 4.6   < > 4.9   < > 5.4* 5.2* 5.9* 5.2* 4.9 4.8 4.7  ?CL 101  --  103  --  101  --  103 99  --  102 99  ?CO2  27  --  25  --  24  --  24 24  --  25 25  ?GLUCOSE 137*  --  156*  --  130*  --  191* 171*  --  142* 145*  ?BUN 63*  --  64*  --  117*  --  111* 90*  --  80* 72*  ?CREATININE 3.71*  --  3.44*  --  7.49*  --  6.72* 5.50*  --  4.76* 4.49*  ?CALCIUM 8.3*  --  8.8*  --  8.7*  --  8.4* 8.5*  --  8.6* 8.5*  ?PHOS 4.6  --  4.1  --  5.4*  --  8.5* 8.2*  --  7.5* 8.2*  ? < > = values in this interval not displayed.  ? ?CBC ?Recent Labs  ?Lab 02/21/22 ?7341 02/21/22 ?9379 02/22/22 ?0327 02/22/22 ?0240 02/25/22 ?9735 02/25/22 ?0750 02/26/22 ?1024 02/26/22 ?1025  ?WBC 12.1*  --  13.9*  --  29.4*  --  29.6*  --   ?NEUTROABS  --   --   --   --  22.1*  --  25.5*  --   ?HGB 9.8*   < > 9.3*   < > 10.9* 11.6* 9.4* 9.9*   ?HCT 31.0*   < > 29.5*   < > 34.6* 34.0* 30.8* 29.0*  ?MCV 81.8  --  79.9*  --  78.8*  --  81.3  --   ?PLT 121*  --  142*  --  246  --  PLATELET CLUMPS NOTED ON SMEAR, UNABLE TO ESTIMATE  --   ? < > = values in this interval not displayed.  ? ? ?Medications:   ? ? acetaminophen  650 mg Oral Q4H  ? Or  ? acetaminophen (TYLENOL) oral liquid 160 mg/5 mL  650 mg Per Tube Q4H  ? Or  ? acetaminophen  650 mg Rectal Q4H  ? ARIPiprazole  10 mg Per Tube Daily  ? busPIRone  30 mg Oral Q8H  ? Or  ? busPIRone  30 mg Per Tube Q8H  ? chlorhexidine gluconate (MEDLINE KIT)  15 mL Mouth Rinse BID  ? Chlorhexidine Gluconate Cloth  6 each Topical Q0600  ? Chlorhexidine Gluconate Cloth  6 each Topical Q0600  ? citalopram  40 mg Per Tube Daily  ? feeding supplement (PROSource TF)  45 mL Per Tube TID  ? hydrocortisone sod succinate (SOLU-CORTEF) inj  100 mg Intravenous Q12H  ? insulin aspart  0-15 Units Subcutaneous Q4H  ? ipratropium-albuterol  3 mL Nebulization TID  ? liothyronine  5 mcg Per Tube QAC breakfast  ? mouth rinse  15 mL Mouth Rinse 10 times per day  ? pantoprazole (PROTONIX) IV  40 mg Intravenous Q24H  ? polyethylene glycol  17 g Per Tube Daily  ? senna-docusate  1 tablet Per Tube BID  ? ?Claudia Desanctis, MD ?Mar 19, 2022, 7:48 AM ? ? ? ? ? ? ? ?
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
## 2022-03-18 NOTE — Progress Notes (Signed)
Time of death 17 declared by Leafy Ro RN and Talmage Coin RN with family at bedside. All belongings taken by patient's wife Herbert Seta.  ?
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
## 2022-03-18 NOTE — Procedures (Signed)
Extubation Procedure Note ? ?Patient Details:   ?Name: Clinton Taylor ?DOB: [birth year] ?MRN: [id number] ?  ?Airway Documentation:  ?  ?Vent end date: 02/26/2022 Vent end time: 0928  ? ?Evaluation ? O2 sats: currently acceptable ?Complications: No apparent complications ?Patient did tolerate procedure well. ?Bilateral Breath Sounds: Clear, Diminished ?  ?No ? ?Pt extubated per physician order and family request to comfort care measures. Pt suctioned orally and via ETT prior. Pt extubated to room air and suctioned orally again. Family members remain at pt bedside. RT will continue to be available as needed.  ? ?Derinda Late ?03/11/2022, 9:32 AM ? ?
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
## 2022-03-18 NOTE — Progress Notes (Signed)
?  03/16/22 0925  ?Clinical Encounter Type  ?Visited With Family;Health care provider  ?Visit Type Initial;Patient actively dying;Critical Care;Spiritual support  ?Referral From Palliative care team  ? ?Chaplain responded to a palliative care consult. Patient is going through terminal extubation. Chaplain met with patient's friends and family and offered support. Chaplain introduced spiritual care services. Chaplain extended hospitality.Spiritual care services available as needed.  ? ?Jeri Lager, Chaplain ? ?

## 2022-03-18 DEATH — deceased
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
